# Patient Record
Sex: Male | Born: 1995 | Race: White | Hispanic: No | Marital: Single | Smoking: Current every day smoker
Health system: Southern US, Community
[De-identification: ages and names within clinical notes are randomized; demographics above are authoritative.]

## PROBLEM LIST (undated history)

## (undated) DIAGNOSIS — R197 Diarrhea, unspecified: Secondary | ICD-10-CM

## (undated) DIAGNOSIS — J45909 Unspecified asthma, uncomplicated: Secondary | ICD-10-CM

## (undated) DIAGNOSIS — R4689 Other symptoms and signs involving appearance and behavior: Secondary | ICD-10-CM

## (undated) DIAGNOSIS — A0472 Enterocolitis due to Clostridium difficile, not specified as recurrent: Secondary | ICD-10-CM

## (undated) DIAGNOSIS — B829 Intestinal parasitism, unspecified: Secondary | ICD-10-CM

## (undated) DIAGNOSIS — F1920 Other psychoactive substance dependence, uncomplicated: Secondary | ICD-10-CM

## (undated) DIAGNOSIS — R4589 Other symptoms and signs involving emotional state: Secondary | ICD-10-CM

## (undated) HISTORY — DX: Intestinal parasitism, unspecified: B82.9

## (undated) HISTORY — DX: Enterocolitis due to Clostridium difficile, not specified as recurrent: A04.72

## (undated) HISTORY — DX: Diarrhea, unspecified: R19.7

---
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
## 2017-10-27 ENCOUNTER — Emergency Department: Admission: EM | Admit: 2017-10-27 | Discharge: 2017-10-27 | Discharge status: Against Medical Advice | Payer: Self-pay

## 2017-10-27 NOTE — ED Triage Notes (Signed)
Called x 3 for triage, not in WR

## 2018-08-24 ENCOUNTER — Other Ambulatory Visit: Payer: Self-pay

## 2018-08-24 ENCOUNTER — Encounter: Payer: Self-pay | Admitting: Emergency Medicine

## 2018-08-24 ENCOUNTER — Emergency Department
Admission: EM | Admit: 2018-08-24 | Discharge: 2018-08-24 | Disposition: A | Discharge status: Home / Self Care | Payer: Self-pay | Attending: Emergency Medicine | Admitting: Emergency Medicine

## 2018-08-24 DIAGNOSIS — Z915 Personal history of self-harm: Secondary | ICD-10-CM | POA: Insufficient documentation

## 2018-08-24 DIAGNOSIS — F1123 Opioid dependence with withdrawal: Secondary | ICD-10-CM | POA: Insufficient documentation

## 2018-08-24 DIAGNOSIS — F1290 Cannabis use, unspecified, uncomplicated: Secondary | ICD-10-CM | POA: Insufficient documentation

## 2018-08-24 DIAGNOSIS — F1193 Opioid use, unspecified with withdrawal: Secondary | ICD-10-CM

## 2018-08-24 DIAGNOSIS — F17200 Nicotine dependence, unspecified, uncomplicated: Secondary | ICD-10-CM | POA: Insufficient documentation

## 2018-08-24 DIAGNOSIS — J45909 Unspecified asthma, uncomplicated: Secondary | ICD-10-CM | POA: Insufficient documentation

## 2018-08-24 LAB — URINE DRUG SCREEN, QUALITATIVE (ARMC ONLY)
Amphetamines, Ur Screen: NOT DETECTED
Barbiturates, Ur Screen: NOT DETECTED
Benzodiazepine, Ur Scrn: NOT DETECTED
Cannabinoid 50 Ng, Ur ~~LOC~~: POSITIVE — AB
Cocaine Metabolite,Ur ~~LOC~~: NOT DETECTED
MDMA (Ecstasy)Ur Screen: NOT DETECTED
Methadone Scn, Ur: NOT DETECTED
Opiate, Ur Screen: POSITIVE — AB
Phencyclidine (PCP) Ur S: NOT DETECTED
Tricyclic, Ur Screen: NOT DETECTED

## 2018-08-24 MED ORDER — ONDANSETRON 4 MG PO TBDP
4.0000 mg | ORAL_TABLET | Freq: Four times a day (QID) | ORAL | Status: DC | PRN
  Administered 2018-08-24: 4 mg via ORAL
  Filled 2018-08-24: qty 1

## 2018-08-24 MED ORDER — HYDROXYZINE HCL 25 MG PO TABS
25.0000 mg | ORAL_TABLET | Freq: Four times a day (QID) | ORAL | Status: DC | PRN
  Administered 2018-08-24: 25 mg via ORAL
  Filled 2018-08-24: qty 1

## 2018-08-24 MED ORDER — NAPROXEN 500 MG PO TABS
500.0000 mg | ORAL_TABLET | Freq: Two times a day (BID) | ORAL | Status: DC | PRN
  Administered 2018-08-24: 500 mg via ORAL
  Filled 2018-08-24: qty 1

## 2018-08-24 MED ORDER — DICYCLOMINE HCL 20 MG PO TABS
20.0000 mg | ORAL_TABLET | Freq: Four times a day (QID) | ORAL | Status: DC | PRN
  Administered 2018-08-24: 20 mg via ORAL
  Filled 2018-08-24: qty 1

## 2018-08-24 NOTE — ED Notes (Signed)
Pt seeking detox for heroin use, last used 08/23/18. Uses multiple times per day. Uses marijuana and cigarettes daily. Denies alcohol use. Denies SI or HI. Pt alert and oriented X4, active, cooperative, pt in NAD. RR even and unlabored, color WNL.

## 2018-08-24 NOTE — ED Notes (Signed)
Pt given snack. 

## 2018-08-24 NOTE — ED Provider Notes (Signed)
[facility] Emergency Department Provider Note   ____________________________________________   First MD Initiated Contact with Patient 08/24/18 1325     (approximate)  I have reviewed the triage vital signs and the nursing notes.   HISTORY  Chief Complaint detox    HPI Lance Marquez is a 23 y.o. male and therefore requested treatment for heroin withdrawals  Patient reports he has a job that is going to start soon, he is a regular heroin user and he is decided to stop.  He last used last evening.  He started to experience symptoms withdrawal including some sweats, nausea, feels a little bit itchy or crawly  Some nausea.  No chest pain or shortness of breath no fevers.  Denies any recent infections.  He is anxious, reports has had the symptoms in the past same symptoms many times when he stopped heroin  No desire to harm himself or anyone else.  Denies hallucinations.   Past Medical History:  Diagnosis Date  . Asthma   . C. difficile diarrhea   . Diarrhea   . Parasites in stool   . Suicidal behavior     Patient Active Problem List   Diagnosis Date Noted  . Suicidal behavior   . Diarrhea   . C. difficile diarrhea   . Parasites in stool     History reviewed. No pertinent surgical history.  Prior to Admission medications   Medication Sig Start Date End Date Taking? Authorizing Provider  albuterol (PROVENTIL) (2.5 MG/3ML) 0.083% nebulizer solution Inhale 2.5 mg into the lungs every 6 (six) hours as needed.   Yes [provider]    Allergies Vancomycin  No family history on file.  Social History Social History   Tobacco Use  . Smoking status: Current Every Day Smoker  . Smokeless tobacco: Never Used  Substance Use Topics  . Alcohol use: No  . Drug use: Yes    Types: IV    Comment: heroin    Review of Systems Constitutional: No fever but gets chilled feeling which she attributes to withdrawal has had before  Eyes: No visual changes. ENT: No sore throat. Cardiovascular: Denies chest pain. Respiratory: Denies shortness of breath. Gastrointestinal: No abdominal pain.  Little bit of nausea. Genitourinary: Negative for dysuria. Musculoskeletal: Negative for back pain.  Does feel little achy Skin: Negative for rash. Neurological: Negative for headaches, areas of focal weakness or numbness.    ____________________________________________   PHYSICAL EXAM:  VITAL SIGNS: ED Triage Vitals  Enc Vitals Group     BP 08/24/18 1029 115/69     Pulse Rate 08/24/18 1029 (!) 121     Resp 08/24/18 1029 17     Temp 08/24/18 1029 99 F (37.2 C)     Temp Source 08/24/18 1029 Oral     SpO2 08/24/18 1029 100 %     Weight 08/24/18 1030 120 lb (54.4 kg)     Height 08/24/18 1030 5\' 9"  (1.753 m)     Head Circumference --      Peak Flow --      Pain Score 08/24/18 1042 0     Pain Loc --      Pain Edu? --      Excl. in [place]? --     Constitutional: Alert and oriented.  Slightly anxious but in no distress Eyes: Conjunctivae are normal. Head: Atraumatic. Nose: No congestion/rhinnorhea. Mouth/Throat: Mucous membranes are moist. Neck: No stridor.  Cardiovascular: Mildly tachycardic rate, regular rhythm. Grossly normal  heart sounds.  Good peripheral circulation. Respiratory: Normal respiratory effort.  No retractions. Lungs CTAB. Gastrointestinal: Soft and nontender. No distention. Musculoskeletal: No lower extremity tenderness nor edema. Neurologic:  Normal speech and language. No gross focal neurologic deficits are appreciated.  Skin:  Skin is warm, dry and intact. No rash noted. Psychiatric: Mood and affect are calm and pleasant.  Normal thought patterns.  Denies hallucinations, desire to harm himself or anyone else.  ____________________________________________   LABS (all labs ordered are listed, but only abnormal results are displayed)  Labs Reviewed  URINE DRUG SCREEN, QUALITATIVE ([facility] ONLY)    ____________________________________________  EKG   ____________________________________________  RADIOLOGY   ____________________________________________   PROCEDURES  Procedure(s) performed: None  Procedures  Critical Care performed: No  ____________________________________________   INITIAL IMPRESSION / ASSESSMENT AND PLAN / ED COURSE  Pertinent labs & imaging results that were available during my care of the patient were reviewed by me and considered in my medical decision making (see chart for details).   Patient presents for heroin withdrawal.  His clinical history and examination appear consistent with this.  He reports same symptoms in the past multiple times.  Denies fever.  Afebrile here.  Slightly tachycardic does report he is a little nervous also going through withdrawals, and I think is mild to moderate tachycardia is associated with his withdrawals.  Will treat with withdrawal relieving medications, have consulted TTS.  Lance Marquez was evaluated in Emergency Department on 08/24/2018 for the symptoms described in the history of present illness. He was evaluated in the context of the global COVID-19 pandemic, which necessitated consideration that the patient might be at risk for infection with the SARS-CoV-2 virus that causes COVID-19. Institutional protocols and algorithms that pertain to the evaluation of patients at risk for COVID-19 are in a state of rapid change based on information released by regulatory bodies including the CDC and federal and state organizations. These policies and algorithms were followed during the patient's care in the ED.  He does not have symptoms consistent with COVID, rather his symptomatology appears consistent with withdrawal.  Denies exposure to anyone known to have COVID     ----------------------------------------- 2:45 PM on 08/24/2018 -----------------------------------------  Patient has a bed available at  residential treatment services.  He is improving with treatment here.  Patient agreeable, will plan to discharge once residential treatment services comes to pick him up for detox treatment.  Patient agreement with plan.  Ongoing care assigned to Dr. Lenard LancePaduchowski, await treatment at residential treatment services.  Patient remains well anticipate discharge once residential treatment services arrived to take patient to detox. ____________________________________________   FINAL CLINICAL IMPRESSION(S) / ED DIAGNOSES  Final diagnoses:  Opioid withdrawal (HCC)        Note:  This document was prepared using Dragon voice recognition software and may include unintentional dictation errors       Sharyn CreamerQuale, Quanetta Truss, MD 08/24/18 2120

## 2018-08-24 NOTE — BH Assessment (Signed)
Pt needs labs completed in order to be considered for detox treatment per [facility].

## 2018-08-24 NOTE — ED Triage Notes (Signed)
[facility] wishes to detox from Heroin.  Last used last night at around 2330.  Denies si/hi

## 2018-08-24 NOTE — ED Notes (Signed)
Pt discharged from ER to [facility]. Left with all of belongings. Pt alert and oriented X4, active, cooperative, pt in NAD. RR even and unlabored, color WNL.

## 2018-08-24 NOTE — BH Assessment (Signed)
Pt information faxed to [facility] for detox treatment ([phone]).

## 2018-08-24 NOTE — ED Notes (Signed)
Pt waiting for TTS 

## 2019-02-28 ENCOUNTER — Encounter: Payer: Self-pay | Admitting: Intensive Care

## 2019-02-28 ENCOUNTER — Emergency Department
Admission: EM | Admit: 2019-02-28 | Discharge: 2019-02-28 | Disposition: A | Discharge status: Home / Self Care | Payer: Self-pay | Attending: Emergency Medicine | Admitting: Emergency Medicine

## 2019-02-28 ENCOUNTER — Other Ambulatory Visit: Payer: Self-pay

## 2019-02-28 DIAGNOSIS — F1110 Opioid abuse, uncomplicated: Secondary | ICD-10-CM | POA: Insufficient documentation

## 2019-02-28 DIAGNOSIS — J45909 Unspecified asthma, uncomplicated: Secondary | ICD-10-CM | POA: Insufficient documentation

## 2019-02-28 DIAGNOSIS — Z79899 Other long term (current) drug therapy: Secondary | ICD-10-CM | POA: Insufficient documentation

## 2019-02-28 DIAGNOSIS — F1510 Other stimulant abuse, uncomplicated: Secondary | ICD-10-CM | POA: Insufficient documentation

## 2019-02-28 DIAGNOSIS — F1910 Other psychoactive substance abuse, uncomplicated: Secondary | ICD-10-CM

## 2019-02-28 DIAGNOSIS — F17210 Nicotine dependence, cigarettes, uncomplicated: Secondary | ICD-10-CM | POA: Insufficient documentation

## 2019-02-28 HISTORY — DX: Other psychoactive substance dependence, uncomplicated: F19.20

## 2019-02-28 LAB — CBC
HCT: 48.9 % (ref 39.0–52.0)
Hemoglobin: 16.5 g/dL (ref 13.0–17.0)
MCH: 32.2 pg (ref 26.0–34.0)
MCHC: 33.7 g/dL (ref 30.0–36.0)
MCV: 95.5 fL (ref 80.0–100.0)
Platelets: 321 10*3/uL (ref 150–400)
RBC: 5.12 MIL/uL (ref 4.22–5.81)
RDW: 12.8 % (ref 11.5–15.5)
WBC: 28.5 10*3/uL — ABNORMAL HIGH (ref 4.0–10.5)
nRBC: 0 % (ref 0.0–0.2)

## 2019-02-28 LAB — COMPREHENSIVE METABOLIC PANEL
ALT: 25 U/L (ref 0–44)
AST: 40 U/L (ref 15–41)
Albumin: 4.3 g/dL (ref 3.5–5.0)
Alkaline Phosphatase: 114 U/L (ref 38–126)
Anion gap: 10 (ref 5–15)
BUN: 15 mg/dL (ref 6–20)
CO2: 23 mmol/L (ref 22–32)
Calcium: 9 mg/dL (ref 8.9–10.3)
Chloride: 105 mmol/L (ref 98–111)
Creatinine, Ser: 1.17 mg/dL (ref 0.61–1.24)
GFR calc Af Amer: >60 mL/min (ref >60)
GFR calc non Af Amer: >60 mL/min (ref >60)
Glucose, Bld: 106 mg/dL — ABNORMAL HIGH (ref 70–99)
Potassium: 5 mmol/L (ref 3.5–5.1)
Sodium: 138 mmol/L (ref 135–145)
Total Bilirubin: 0.7 mg/dL (ref 0.3–1.2)
Total Protein: 8.3 g/dL — ABNORMAL HIGH (ref 6.5–8.1)

## 2019-02-28 LAB — ETHANOL: Alcohol, Ethyl (B): <10 mg/dL (ref <10)

## 2019-02-28 NOTE — ED Notes (Signed)
Pt alert and oriented, nad noted.

## 2019-02-28 NOTE — ED Provider Notes (Signed)
[facility] Emergency Department Provider Note ____________________________________________   First MD Initiated Contact with Patient 02/28/19 1721     (approximate)  I have reviewed the triage vital signs and the nursing notes.   HISTORY  Chief Complaint Addiction Problem and Ear Pain  Level 5 caveat: History of present illness limited due to drug intoxication  HPI Lance Marquez is a 24 y.o. male with PMH as noted below as well as a history of polysubstance abuse (heroin, methamphetamine) presents after he used heroin earlier today and then awoke feeling very weak, confused, and with anxiety and ringing in his ears.  The patient states he felt like he was stumbling around and could not think clearly.  He states that he had not used heroin for about 6 months before today, although he did not take the large amount.  He states he last used methamphetamine about 3 days ago.  He states he is still feeling a bit tired and confused but better than he did earlier.   Past Medical History:  Diagnosis Date  . Asthma   . C. difficile diarrhea   . Diarrhea   . Drug abuse and dependence ([place])   . Parasites in stool   . Suicidal behavior     Patient Active Problem List   Diagnosis Date Noted  . Suicidal behavior   . Diarrhea   . C. difficile diarrhea   . Parasites in stool     History reviewed. No pertinent surgical history.  Prior to Admission medications   Medication Sig Start Date End Date Taking? Authorizing Provider  albuterol (PROVENTIL) (2.5 MG/3ML) 0.083% nebulizer solution Inhale 2.5 mg into the lungs every 6 (six) hours as needed.    [provider]    Allergies Vancomycin  History reviewed. No pertinent family history.  Social History Social History   Tobacco Use  . Smoking status: Current Every Day Smoker    Types: Cigarettes  . Smokeless tobacco: Never Used  Substance Use Topics  . Alcohol use: Yes    Comment: occ  .  Drug use: Yes    Types: IV    Comment: heroin, meth    Review of Systems Level 5 caveat: Unable to obtain review of systems due to drug intoxication    ____________________________________________   PHYSICAL EXAM:  VITAL SIGNS: ED Triage Vitals  Enc Vitals Group     BP 02/28/19 1644 107/78     Pulse Rate 02/28/19 1644 99     Resp 02/28/19 1644 16     Temp 02/28/19 1758 98.3 F (36.8 C)     Temp Source 02/28/19 1758 Oral     SpO2 02/28/19 1644 100 %     Weight 02/28/19 1645 130 lb (59 kg)     Height 02/28/19 1645 5\' 9"  (1.753 m)     Head Circumference --      Peak Flow --      Pain Score 02/28/19 1645 0     Pain Loc --      Pain Edu? --      Excl. in [place]? --     Constitutional: Somnolent but arousable.  Oriented x4. Eyes: Conjunctivae are normal.  EOMI. Head: Atraumatic. Nose: No congestion/rhinnorhea. Mouth/Throat: Mucous membranes are moist.   Neck: Normal range of motion.  Cardiovascular: Normal rate, regular rhythm.  Good peripheral circulation. Respiratory: Normal respiratory effort.  No retractions.  Gastrointestinal:  No distention.  Musculoskeletal:   Extremities warm and well perfused.  Neurologic: Motor intact in all extremities. Skin:  Skin is warm and dry. No rash noted. Psychiatric: Calm and cooperative.  ____________________________________________   LABS (all labs ordered are listed, but only abnormal results are displayed)  Labs Reviewed  COMPREHENSIVE METABOLIC PANEL - Abnormal; Notable for the following components:      Result Value   Glucose, Bld 106 (*)    Total Protein 8.3 (*)    All other components within normal limits  CBC - Abnormal; Notable for the following components:   WBC 28.5 (*)    All other components within normal limits  ETHANOL  URINE DRUG SCREEN, QUALITATIVE ([facility] ONLY)   ____________________________________________  EKG   ____________________________________________  RADIOLOGY     ____________________________________________   PROCEDURES  Procedure(s) performed: No  Procedures  Critical Care performed: No ____________________________________________   INITIAL IMPRESSION / ASSESSMENT AND PLAN / ED COURSE  Pertinent labs & imaging results that were available during my care of the patient were reviewed by me and considered in my medical decision making (see chart for details).  24 year old male with a history of polysubstance abuse presents with symptoms include anxiety, ringing in his ears, and feeling confused and weak when he awoke after using heroin earlier today.  The patient reports that this was his first time using heroin in several months.  He last used methamphetamine 2 or 3 days ago.  He states he does not drink.  Currently on exam, the patient is a bit somnolent but arousable and able to answer questions.  Neurologic exam is nonfocal.  There is no evidence of trauma.  I suspect that the patient's symptoms are related to polysubstance abuse.  He has no evidence of heroin or methamphetamine withdrawal at this time.  I suspect that the patient had more severe side effects than normal given he had not used heroin for about 6 months.  We will obtain lab work-up and observe the patient for several hours.  ----------------------------------------- 7:57 PM on 02/28/2019 -----------------------------------------  The patient now is fully awake and alert, oriented x4, and ambulating with steady gait.  He states he is feeling much better and no longer feels confused.  He no longer has any ringing in his ears.  He feels well to go home.  He has no psychiatric symptoms and does not require psychiatric evaluation.  At this time, he is stable for discharge.  I offered the patient substance abuse counseling resources.  Return precautions provided and he expressed understanding.  ____________________________________________   FINAL CLINICAL IMPRESSION(S) / ED  DIAGNOSES  Final diagnoses:  Substance abuse (HCC)      NEW MEDICATIONS STARTED DURING THIS VISIT:  New Prescriptions   No medications on file     Note:  This document was prepared using Dragon voice recognition software and may include unintentional dictation errors.    Dionne Bucy, MD 02/28/19 1958

## 2019-02-28 NOTE — ED Notes (Signed)
Discharge signature in paper chart- pt was in hallway bed.

## 2019-02-28 NOTE — ED Notes (Signed)
Pt appears very sleepy, states that he used heroin today and then went to sleep, pt states that when he woke up he felt really strange states that he has never felt that way before, but states that he hadn't used heroin in awhile that he had quit it and has only been using meth, pt states that he hasn't used meth in 2-3 days. Ems reported that the patient was c/o ringing in his ears. Pt is placed in a recliner in the hall for easier visualization, pt is falling asleep easily and drooping his ice water. No distress noted at this time
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
## 2019-02-28 NOTE — ED Notes (Signed)
Spoke w/ MD Siadecki about plan of care. Will ambulate pt when he wakes up and assess condition for d/c per md request.

## 2019-02-28 NOTE — ED Notes (Signed)
Pt ambulates without difficulty, denies any dizziness. Pt provided urinal and assisted to bathroom. MD notified.
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
## 2019-02-28 NOTE — Discharge Instructions (Addendum)
Return to the ER for any new, worsening, or persistent symptoms that concern you.

## 2019-02-28 NOTE — ED Notes (Signed)
UA not collected- pt states he cannot pee.

## 2019-02-28 NOTE — ED Notes (Signed)
Pt offered dinner; declined 

## 2019-02-28 NOTE — ED Notes (Signed)
Pt states he doesn't feel as dizzy as before and he is just tired and wants to go home and sleep. Explained to pt change of shift and that new MD will be assigned and discharge or observation can be discussed at that time. Pt verbalizes understanding.

## 2019-08-31 ENCOUNTER — Emergency Department
Admission: EM | Admit: 2019-08-31 | Discharge: 2019-08-31 | Disposition: A | Discharge status: Home / Self Care | Payer: Self-pay | Attending: Emergency Medicine | Admitting: Emergency Medicine

## 2019-08-31 ENCOUNTER — Other Ambulatory Visit: Payer: Self-pay

## 2019-08-31 DIAGNOSIS — Z5321 Procedure and treatment not carried out due to patient leaving prior to being seen by health care provider: Secondary | ICD-10-CM | POA: Insufficient documentation

## 2019-08-31 NOTE — ED Triage Notes (Signed)
Pt comes via POV from home with c/o right foot laceration. Pt states he kicked a tool box that had a metal piece sticking out of it.  Pt has bandage in place and bleeding controlled at this time

## 2019-08-31 NOTE — ED Notes (Signed)
Called for the pt in the WR with no response

## 2020-04-17 ENCOUNTER — Encounter: Payer: Self-pay | Admitting: Emergency Medicine

## 2020-04-17 ENCOUNTER — Other Ambulatory Visit: Payer: Self-pay

## 2020-04-17 ENCOUNTER — Emergency Department
Admission: EM | Admit: 2020-04-17 | Discharge: 2020-04-17 | Disposition: A | Discharge status: Home / Self Care | Payer: Self-pay | Attending: Emergency Medicine | Admitting: Emergency Medicine

## 2020-04-17 DIAGNOSIS — R300 Dysuria: Secondary | ICD-10-CM | POA: Insufficient documentation

## 2020-04-17 DIAGNOSIS — R112 Nausea with vomiting, unspecified: Secondary | ICD-10-CM | POA: Insufficient documentation

## 2020-04-17 DIAGNOSIS — F17210 Nicotine dependence, cigarettes, uncomplicated: Secondary | ICD-10-CM | POA: Insufficient documentation

## 2020-04-17 DIAGNOSIS — J45909 Unspecified asthma, uncomplicated: Secondary | ICD-10-CM | POA: Insufficient documentation

## 2020-04-17 DIAGNOSIS — R109 Unspecified abdominal pain: Secondary | ICD-10-CM | POA: Insufficient documentation

## 2020-04-17 LAB — COMPREHENSIVE METABOLIC PANEL
ALT: 17 U/L (ref 0–44)
AST: 22 U/L (ref 15–41)
Albumin: 4 g/dL (ref 3.5–5.0)
Alkaline Phosphatase: 79 U/L (ref 38–126)
Anion gap: 8 (ref 5–15)
BUN: 24 mg/dL — ABNORMAL HIGH (ref 6–20)
CO2: 23 mmol/L (ref 22–32)
Calcium: 9 mg/dL (ref 8.9–10.3)
Chloride: 107 mmol/L (ref 98–111)
Creatinine, Ser: 0.95 mg/dL (ref 0.61–1.24)
GFR, Estimated: >60 mL/min (ref >60)
Glucose, Bld: 96 mg/dL (ref 70–99)
Potassium: 4 mmol/L (ref 3.5–5.1)
Sodium: 138 mmol/L (ref 135–145)
Total Bilirubin: 0.9 mg/dL (ref 0.3–1.2)
Total Protein: 7.6 g/dL (ref 6.5–8.1)

## 2020-04-17 LAB — CBC
HCT: 36.6 % — ABNORMAL LOW (ref 39.0–52.0)
Hemoglobin: 12.7 g/dL — ABNORMAL LOW (ref 13.0–17.0)
MCH: 31.1 pg (ref 26.0–34.0)
MCHC: 34.7 g/dL (ref 30.0–36.0)
MCV: 89.7 fL (ref 80.0–100.0)
Platelets: 351 10*3/uL (ref 150–400)
RBC: 4.08 MIL/uL — ABNORMAL LOW (ref 4.22–5.81)
RDW: 13 % (ref 11.5–15.5)
WBC: 8.8 10*3/uL (ref 4.0–10.5)
nRBC: 0 % (ref 0.0–0.2)

## 2020-04-17 LAB — URINALYSIS, COMPLETE (UACMP) WITH MICROSCOPIC
Bacteria, UA: NONE SEEN
Bilirubin Urine: NEGATIVE
Glucose, UA: NEGATIVE mg/dL
Hgb urine dipstick: NEGATIVE
Ketones, ur: NEGATIVE mg/dL
Leukocytes,Ua: NEGATIVE
Nitrite: NEGATIVE
Protein, ur: NEGATIVE mg/dL
Specific Gravity, Urine: 1.026 (ref 1.005–1.030)
Squamous Epithelial / HPF: NONE SEEN (ref 0–5)
pH: 7 (ref 5.0–8.0)

## 2020-04-17 MED ORDER — HYDROMORPHONE HCL 1 MG/ML IJ SOLN
1.0000 mg | Freq: Once | INTRAMUSCULAR | Status: AC
Start: 2020-04-17 — End: 2020-04-17
  Administered 2020-04-17: 1 mg via INTRAVENOUS
  Filled 2020-04-17: qty 1

## 2020-04-17 MED ORDER — ONDANSETRON HCL 4 MG/2ML IJ SOLN
4.0000 mg | Freq: Once | INTRAMUSCULAR | Status: AC
  Administered 2020-04-17: 4 mg via INTRAVENOUS
  Filled 2020-04-17: qty 2

## 2020-04-17 MED ORDER — NAPROXEN 500 MG PO TABS: 500.0000 mg | ORAL_TABLET | Freq: Two times a day (BID) | ORAL | Status: DC

## 2020-04-17 MED ORDER — KETOROLAC TROMETHAMINE 30 MG/ML IJ SOLN
30.0000 mg | Freq: Once | INTRAMUSCULAR | Status: AC
  Administered 2020-04-17: 30 mg via INTRAVENOUS
  Filled 2020-04-17: qty 1

## 2020-04-17 MED ORDER — ONDANSETRON HCL 8 MG PO TABS: 8.0000 mg | ORAL_TABLET | Freq: Three times a day (TID) | ORAL | 0 refills | Status: DC | PRN

## 2020-04-17 MED ORDER — SODIUM CHLORIDE 0.9 % IV BOLUS
1000.0000 mL | Freq: Once | INTRAVENOUS | Status: AC
  Administered 2020-04-17: 1000 mL via INTRAVENOUS

## 2020-04-17 MED ORDER — ORPHENADRINE CITRATE ER 100 MG PO TB12: 100.0000 mg | ORAL_TABLET | Freq: Two times a day (BID) | ORAL | 0 refills | Status: DC

## 2020-04-17 NOTE — ED Triage Notes (Signed)
C/O right flank pain and painful urination x 2 days. States had similar symptoms 1.5 years ago with kidney stones.  AAOx3. Skin warm and dry. NAD

## 2020-04-17 NOTE — ED Notes (Signed)
See triage note  Presents with right flank pain  States pain started couple of days ago  Pain is non radiating   Positive n/v    Also having some problems with urination

## 2020-04-17 NOTE — ED Provider Notes (Signed)
[facility] Emergency Department Provider Note   ____________________________________________   Event Date/Time   First MD Initiated Contact with Patient 04/17/20 1104     (approximate)  I have reviewed the triage vital signs and the nursing notes.   HISTORY  Chief Complaint Flank Pain    HPI Lance Marquez is a 25 y.o. male patient presents with right flank pain for a few days.  Patient also has nausea and vomiting.  States dysuria.  Denies urethral discharge.  Denies high risk sexual contact.  Patient  concerned secondary to history of kidney stones.  Last episode was approximate 1-1/2 years ago.  Rates his pain as a 5/10.  Described pain as "achy".  No palliative measure for complaint.         Past Medical History:  Diagnosis Date  . Asthma   . C. difficile diarrhea   . Diarrhea   . Drug abuse and dependence (HCC)   . Parasites in stool   . Suicidal behavior     Patient Active Problem List   Diagnosis Date Noted  . Suicidal behavior   . Diarrhea   . C. difficile diarrhea   . Parasites in stool     History reviewed. No pertinent surgical history.  Prior to Admission medications   Medication Sig Start Date End Date Taking? Authorizing Provider  naproxen (NAPROSYN) 500 MG tablet Take 1 tablet (500 mg total) by mouth 2 (two) times daily with a meal. 04/17/20  Yes Joni Reining, PA-C  ondansetron (ZOFRAN) 8 MG tablet Take 1 tablet (8 mg total) by mouth every 8 (eight) hours as needed for nausea or vomiting. 04/17/20  Yes Joni Reining, PA-C  orphenadrine (NORFLEX) 100 MG tablet Take 1 tablet (100 mg total) by mouth 2 (two) times daily. 04/17/20  Yes Joni Reining, PA-C  albuterol (PROVENTIL) (2.5 MG/3ML) 0.083% nebulizer solution Inhale 2.5 mg into the lungs every 6 (six) hours as needed.    [provider]    Allergies Vancomycin  No family history on file.  Social History Social History   Tobacco Use  . Smoking  status: Current Every Day Smoker    Types: Cigarettes  . Smokeless tobacco: Never Used  Substance Use Topics  . Alcohol use: Yes    Comment: occ  . Drug use: Yes    Types: IV    Comment: heroin, meth    Review of Systems Constitutional: No fever/chills Eyes: No visual changes. ENT: No sore throat. Cardiovascular: Denies chest pain. Respiratory: Denies shortness of breath. Gastrointestinal: No abdominal pain.  Nausea and vomiting.  No diarrhea.  No constipation. Genitourinary: Positive for dysuria. Musculoskeletal: Negative for back pain. Skin: Negative for rash. Neurological: Negative for headaches, focal weakness or numbness. Allergic/Immunilogical: Vancomycin ____________________________________________   PHYSICAL EXAM:  VITAL SIGNS: ED Triage Vitals  Enc Vitals Group     BP 04/17/20 0944 121/72     Pulse Rate 04/17/20 0944 98     Resp 04/17/20 0944 16     Temp 04/17/20 0944 98.2 F (36.8 C)     Temp Source 04/17/20 0944 Oral     SpO2 04/17/20 0948 100 %     Weight 04/17/20 0946 130 lb (59 kg)     Height 04/17/20 0946 5\' 10"  (1.778 m)     Head Circumference --      Peak Flow --      Pain Score 04/17/20 0945 5     Pain Loc --  Pain Edu? --      Excl. in GC? --    Constitutional: Alert and oriented. Well appearing and in no acute distress. Hematological/Lymphatic/Immunilogical: No cervical lymphadenopathy. Cardiovascular: Normal rate, regular rhythm. Grossly normal heart sounds.  Good peripheral circulation. Respiratory: Normal respiratory effort.  No retractions. Lungs CTAB. Gastrointestinal: Soft and nontender. No distention. No abdominal bruits.  CVA tenderness. Genitourinary: Deferred Musculoskeletal: No lower extremity tenderness nor edema.  No joint effusions. Neurologic:  Normal speech and language. No gross focal neurologic deficits are appreciated. No gait instability. Skin:  Skin is warm, dry and intact. No rash noted. Psychiatric: Mood and affect  are normal. Speech and behavior are normal.  ____________________________________________   LABS (all labs ordered are listed, but only abnormal results are displayed)  Labs Reviewed  COMPREHENSIVE METABOLIC PANEL - Abnormal; Notable for the following components:      Result Value   BUN 24 (*)    All other components within normal limits  CBC - Abnormal; Notable for the following components:   RBC 4.08 (*)    Hemoglobin 12.7 (*)    HCT 36.6 (*)    All other components within normal limits  URINALYSIS, COMPLETE (UACMP) WITH MICROSCOPIC - Abnormal; Notable for the following components:   Color, Urine YELLOW (*)    APPearance CLEAR (*)    All other components within normal limits   ____________________________________________  EKG   ____________________________________________  RADIOLOGY I, Joni Reining, personally viewed and evaluated these images (plain radiographs) as part of my medical decision making, as well as reviewing the written report by the radiologist.  ED MD interpretation:    Official radiology report(s): No results found.  ____________________________________________   PROCEDURES  Procedure(s) performed (including Critical Care):  Procedures   ____________________________________________   INITIAL IMPRESSION / ASSESSMENT AND PLAN / ED COURSE  As part of my medical decision making, I reviewed the following data within the electronic MEDICAL RECORD NUMBER         Patient presents with right flank pain for few days.  Patient also complained of nausea and vomiting.  Patient physical exam is unremarkable for complaint.  Discussed no acute findings on urinalysis.  Discussed lab results with patient.  Patient states he cannot stay for further evaluation since he has pick up  child from school.      ____________________________________________   FINAL CLINICAL IMPRESSION(S) / ED DIAGNOSES  Final diagnoses:  Right flank pain     ED Discharge  Orders         Ordered    naproxen (NAPROSYN) 500 MG tablet  2 times daily with meals        04/17/20 1256    orphenadrine (NORFLEX) 100 MG tablet  2 times daily        04/17/20 1256    ondansetron (ZOFRAN) 8 MG tablet  Every 8 hours PRN        04/17/20 1300          *Please note:  Lance Marquez was evaluated in Emergency Department on 04/17/2020 for the symptoms described in the history of present illness. He was evaluated in the context of the global COVID-19 pandemic, which necessitated consideration that the patient might be at risk for infection with the SARS-CoV-2 virus that causes COVID-19. Institutional protocols and algorithms that pertain to the evaluation of patients at risk for COVID-19 are in a state of rapid change based on information released by regulatory bodies including the CDC and federal and state  organizations. These policies and algorithms were followed during the patient's care in the ED.  Some ED evaluations and interventions may be delayed as a result of limited staffing during and the pandemic.*   Note:  This document was prepared using Dragon voice recognition software and may include unintentional dictation errors.    Joni Reining, PA-C 04/17/20 1303    Sharyn Creamer, MD 04/17/20 2154

## 2020-04-17 NOTE — Discharge Instructions (Addendum)
No acute finding on urine sample.  Follow discharge care instruction take medication as directed.  Return to ED if condition worsens.

## 2020-07-24 ENCOUNTER — Encounter: Payer: Self-pay | Admitting: Emergency Medicine

## 2020-07-24 ENCOUNTER — Other Ambulatory Visit: Payer: Self-pay

## 2020-07-24 ENCOUNTER — Emergency Department
Admission: EM | Admit: 2020-07-24 | Discharge: 2020-07-24 | Disposition: A | Discharge status: Home / Self Care | Payer: Self-pay | Attending: Emergency Medicine | Admitting: Emergency Medicine

## 2020-07-24 DIAGNOSIS — R5383 Other fatigue: Secondary | ICD-10-CM | POA: Insufficient documentation

## 2020-07-24 DIAGNOSIS — Z28310 Unvaccinated for covid-19: Secondary | ICD-10-CM | POA: Insufficient documentation

## 2020-07-24 DIAGNOSIS — J45909 Unspecified asthma, uncomplicated: Secondary | ICD-10-CM | POA: Insufficient documentation

## 2020-07-24 DIAGNOSIS — Z20822 Contact with and (suspected) exposure to covid-19: Secondary | ICD-10-CM | POA: Insufficient documentation

## 2020-07-24 DIAGNOSIS — R0981 Nasal congestion: Secondary | ICD-10-CM | POA: Insufficient documentation

## 2020-07-24 DIAGNOSIS — K625 Hemorrhage of anus and rectum: Secondary | ICD-10-CM | POA: Insufficient documentation

## 2020-07-24 DIAGNOSIS — R519 Headache, unspecified: Secondary | ICD-10-CM | POA: Insufficient documentation

## 2020-07-24 DIAGNOSIS — R197 Diarrhea, unspecified: Secondary | ICD-10-CM | POA: Insufficient documentation

## 2020-07-24 DIAGNOSIS — F17210 Nicotine dependence, cigarettes, uncomplicated: Secondary | ICD-10-CM | POA: Insufficient documentation

## 2020-07-24 DIAGNOSIS — M791 Myalgia, unspecified site: Secondary | ICD-10-CM | POA: Insufficient documentation

## 2020-07-24 LAB — SARS CORONAVIRUS 2 (TAT 6-24 HRS): SARS Coronavirus 2: NEGATIVE

## 2020-07-24 MED ORDER — NIRMATRELVIR/RITONAVIR (PAXLOVID)TABLET: 3.0000 | ORAL_TABLET | Freq: Two times a day (BID) | ORAL | 0 refills | Status: AC

## 2020-07-24 NOTE — Discharge Instructions (Addendum)
Please fill prescription if your covid test comes back positive!

## 2020-07-24 NOTE — ED Triage Notes (Signed)
C/O sinus congestion x 3 days and rectal bleeding this morning.  AAOX3.  Skin warm and dry. NAD

## 2020-07-24 NOTE — ED Provider Notes (Signed)
[facility] Emergency Department Provider Note   ____________________________________________    I have reviewed the triage vital signs and the nursing notes.   HISTORY  Chief Complaint sinus congestion and Rectal Bleeding      HPI Lance Marquez is a 25 y.o. male with history as noted below who presents with complaints of headache, sinus congestion, body aches, fatigue as well as some diarrhea.  He reports he noticed a small amount of bright red blood in his diarrhea today as well.  Did have a fever last night, took Tylenol.  Diarrhea only started yesterday.  Headache started 3 days ago.  Has not been vaccinated against COVID-19  Past Medical History:  Diagnosis Date   Asthma    C. difficile diarrhea    Diarrhea    Drug abuse and dependence (HCC)    Parasites in stool    Suicidal behavior     Patient Active Problem List   Diagnosis Date Noted   Suicidal behavior    Diarrhea    C. difficile diarrhea    Parasites in stool     History reviewed. No pertinent surgical history.  Prior to Admission medications   Medication Sig Start Date End Date Taking? Authorizing Provider  nirmatrelvir/ritonavir EUA (PAXLOVID) TABS Take 3 tablets by mouth 2 (two) times daily for 5 days. Patient GFR is normal. Take nirmatrelvir (150 mg) two tablets twice daily for 5 days and ritonavir (100 mg) one tablet twice daily for 5 days. 07/24/20 07/29/20 Yes Jene Every, MD  albuterol (PROVENTIL) (2.5 MG/3ML) 0.083% nebulizer solution Inhale 2.5 mg into the lungs every 6 (six) hours as needed.    [provider]  naproxen (NAPROSYN) 500 MG tablet Take 1 tablet (500 mg total) by mouth 2 (two) times daily with a meal. 04/17/20   Joni Reining, PA-C  ondansetron (ZOFRAN) 8 MG tablet Take 1 tablet (8 mg total) by mouth every 8 (eight) hours as needed for nausea or vomiting. 04/17/20   Joni Reining, PA-C  orphenadrine (NORFLEX) 100 MG tablet Take 1 tablet  (100 mg total) by mouth 2 (two) times daily. 04/17/20   Joni Reining, PA-C     Allergies Vancomycin  No family history on file.  Social History Social History   Tobacco Use   Smoking status: Every Day    Pack years: 0.00    Types: Cigarettes   Smokeless tobacco: Never  Substance Use Topics   Alcohol use: Yes    Comment: occ   Drug use: Yes    Types: IV    Comment: heroin, meth    Review of Systems  Constitutional: As above Eyes: No visual changes.  ENT: As above, mild sore throat Cardiovascular: Denies chest pain. Respiratory: Denies shortness of breath. Gastrointestinal: No abdominal pain.  No nausea, no vomiting.  Diarrhea as above Genitourinary: Negative for dysuria. Musculoskeletal: Myalgias Skin: Negative for rash. Neurological: Negative for weakness, positive headache   ____________________________________________   PHYSICAL EXAM:  VITAL SIGNS: ED Triage Vitals  Enc Vitals Group     BP 07/24/20 0957 133/85     Pulse Rate 07/24/20 0957 (!) 111     Resp 07/24/20 0957 17     Temp 07/24/20 0957 98.2 F (36.8 C)     Temp Source 07/24/20 0957 Oral     SpO2 07/24/20 0957 99 %     Weight 07/24/20 0951 59 kg (130 lb 1.1 oz)     Height 07/24/20 0951  1.778 m (5\' 10" )     Head Circumference --      Peak Flow --      Pain Score 07/24/20 0951 5     Pain Loc --      Pain Edu? --      Excl. in GC? --     Constitutional: Alert and oriented. No acute distress. Pleasant and interactive  Nose: No congestion/rhinnorhea. Mouth/Throat: Mucous membranes are moist.  Pharynx normal Neck:  Painless ROM Cardiovascular: Normal rate, regular rhythm. Good peripheral circulation. Respiratory: Normal respiratory effort.  No retractions.  Gastrointestinal: Soft and nontender. No distention.  No CVA tenderness.  Musculoskeletal: No lower extremity tenderness nor edema.  Warm and well perfused Neurologic:  Normal speech and language. No gross focal neurologic deficits are  appreciated.  Skin:  Skin is warm, dry and intact. No rash noted. Psychiatric: Mood and affect are normal. Speech and behavior are normal.  ____________________________________________   LABS (all labs ordered are listed, but only abnormal results are displayed)  Labs Reviewed  SARS CORONAVIRUS 2 (TAT 6-24 HRS)   ____________________________________________  EKG   ____________________________________________  RADIOLOGY   ____________________________________________   PROCEDURES  Procedure(s) performed: No  Procedures   Critical Care performed: No ____________________________________________   INITIAL IMPRESSION / ASSESSMENT AND PLAN / ED COURSE  Pertinent labs & imaging results that were available during my care of the patient were reviewed by me and considered in my medical decision making (see chart for details).   Patient overall well-appearing and in no acute distress mild tachycardia likely related to viral illness given his symptoms.  We will send COVID swab, will write prescription for back slightly to fill if COVID-positive, supportive care return precautions discussed    ____________________________________________   FINAL CLINICAL IMPRESSION(S) / ED DIAGNOSES  Final diagnoses:  Suspected COVID-19 virus infection        Note:  This document was prepared using Dragon voice recognition software and may include unintentional dictation errors.    07/26/20, MD 07/24/20 1056

## 2021-03-01 ENCOUNTER — Other Ambulatory Visit: Payer: Self-pay

## 2021-03-01 ENCOUNTER — Emergency Department: Payer: Self-pay

## 2021-03-01 ENCOUNTER — Emergency Department
Admission: EM | Admit: 2021-03-01 | Discharge: 2021-03-02 | Disposition: A | Discharge status: Home / Self Care | Payer: Self-pay | Attending: Emergency Medicine | Admitting: Emergency Medicine

## 2021-03-01 DIAGNOSIS — L03116 Cellulitis of left lower limb: Secondary | ICD-10-CM | POA: Insufficient documentation

## 2021-03-01 DIAGNOSIS — R234 Changes in skin texture: Secondary | ICD-10-CM | POA: Insufficient documentation

## 2021-03-01 LAB — COMPREHENSIVE METABOLIC PANEL
ALT: 50 U/L — ABNORMAL HIGH (ref 0–44)
AST: 89 U/L — ABNORMAL HIGH (ref 15–41)
Albumin: 4.3 g/dL (ref 3.5–5.0)
Alkaline Phosphatase: 72 U/L (ref 38–126)
Anion gap: 8 (ref 5–15)
BUN: 23 mg/dL — ABNORMAL HIGH (ref 6–20)
CO2: 25 mmol/L (ref 22–32)
Calcium: 9.3 mg/dL (ref 8.9–10.3)
Chloride: 103 mmol/L (ref 98–111)
Creatinine, Ser: 0.88 mg/dL (ref 0.61–1.24)
GFR, Estimated: >60 mL/min (ref >60)
Glucose, Bld: 135 mg/dL — ABNORMAL HIGH (ref 70–99)
Potassium: 3.8 mmol/L (ref 3.5–5.1)
Sodium: 136 mmol/L (ref 135–145)
Total Bilirubin: 0.7 mg/dL (ref 0.3–1.2)
Total Protein: 8.3 g/dL — ABNORMAL HIGH (ref 6.5–8.1)

## 2021-03-01 LAB — CBC WITH DIFFERENTIAL/PLATELET
Abs Immature Granulocytes: 0.02 10*3/uL (ref 0.00–0.07)
Basophils Absolute: 0.1 10*3/uL (ref 0.0–0.1)
Basophils Relative: 1 %
Eosinophils Absolute: 0.2 10*3/uL (ref 0.0–0.5)
Eosinophils Relative: 3 %
HCT: 36.5 % — ABNORMAL LOW (ref 39.0–52.0)
Hemoglobin: 12.6 g/dL — ABNORMAL LOW (ref 13.0–17.0)
Immature Granulocytes: 0 %
Lymphocytes Relative: 39 %
Lymphs Abs: 3.3 10*3/uL (ref 0.7–4.0)
MCH: 30 pg (ref 26.0–34.0)
MCHC: 34.5 g/dL (ref 30.0–36.0)
MCV: 86.9 fL (ref 80.0–100.0)
Monocytes Absolute: 0.6 10*3/uL (ref 0.1–1.0)
Monocytes Relative: 7 %
Neutro Abs: 4.3 10*3/uL (ref 1.7–7.7)
Neutrophils Relative %: 50 %
Platelets: 255 10*3/uL (ref 150–400)
RBC: 4.2 MIL/uL — ABNORMAL LOW (ref 4.22–5.81)
RDW: 13.1 % (ref 11.5–15.5)
WBC: 8.5 10*3/uL (ref 4.0–10.5)
nRBC: 0 % (ref 0.0–0.2)

## 2021-03-01 LAB — LACTIC ACID, PLASMA: Lactic Acid, Venous: 0.9 mmol/L (ref 0.5–1.9)

## 2021-03-01 MED ORDER — CLINDAMYCIN HCL 150 MG PO CAPS
600.0000 mg | ORAL_CAPSULE | Freq: Once | ORAL | Status: AC
  Administered 2021-03-01: 600 mg via ORAL
  Filled 2021-03-01: qty 4

## 2021-03-01 MED ORDER — CLINDAMYCIN HCL 300 MG PO CAPS: 300.0000 mg | ORAL_CAPSULE | Freq: Three times a day (TID) | ORAL | 0 refills | Status: AC

## 2021-03-01 NOTE — ED Provider Notes (Signed)
[facility] Provider Note    Event Date/Time   First MD Initiated Contact with Patient 03/01/21 2114     (approximate)  History   Chief Complaint: Insect Bite and Withdrawal  HPI  Lance Marquez is a 26 y.o. male with a past medical history of substance abuse presents to the emergency department for left foot pain and swelling.  According to the patient has a history of IV substance abuse including heroin and methamphetamine.  Patient states he just got out of prison 5 days ago after being in prison for 1 year.  States since being out he has relapsed using IV methamphetamine and heroin once again.  Patient states over the past several days he has been experiencing increased pain and swelling in his left foot.  Patient is also developed scabbing over his face and arms.  Patient denies any known fever.  Physical Exam   Triage Vital Signs: ED Triage Vitals  Enc Vitals Group     BP 03/01/21 2026 132/68     Pulse Rate 03/01/21 2026 98     Resp 03/01/21 2026 20     Temp 03/01/21 2026 98 F (36.7 C)     Temp Source 03/01/21 2026 Oral     SpO2 03/01/21 2026 99 %     Weight 03/01/21 2027 170 lb (77.1 kg)     Height 03/01/21 2027 5\' 9"  (1.753 m)     Head Circumference --      Peak Flow --      Pain Score 03/01/21 2027 8     Pain Loc --      Pain Edu? --      Excl. in GC? --     Most recent vital signs: Vitals:   03/01/21 2026  BP: 132/68  Pulse: 98  Resp: 20  Temp: 98 F (36.7 C)  SpO2: 99%    General: Awake, no distress.  CV:  Good peripheral perfusion.  Regular rate and rhythm  Resp:  Normal effort.  Equal breath sounds bilaterally.  Abd:  No distention.  Soft, nontender.  No rebound or guarding. Other:  Moderate swelling mild erythema and moderate tenderness of the left foot especially to the dorsal aspect.  Patient also has significant scabbing to his face and upper extremities   RADIOLOGY  X-ray on my evaluation images does not  appear to show any significant abnormality.  I do not see any gas. Radiologist read the x-ray is soft tissue swelling without acute bony abnormality  MEDICATIONS ORDERED IN ED: Medications - No data to display   IMPRESSION / MDM / ASSESSMENT AND PLAN / ED COURSE  I reviewed the triage vital signs and the nursing notes.  Patient presents emergency department for evaluation of left foot pain redness and swelling.  Patient states he recently relapsed using IV heroin and methamphetamine after being in jail for 1 year.  Patient denies injecting anything into the foot.  Given the left foot redness pain and swelling concern for cellulitis.  We will obtain an x-ray to rule out gas.  We will check labs to evaluate for signs of increased infection such as a CBC and a lactate.  We will continue to closely monitor while awaiting results.  Patient agreeable to plan of care.  X-rays negative for gas.  Patient does have swelling consistent with cellulitis.  Reassuringly patient's white blood cell count is normal.  Lactic acid is normal.  We will discharge with clindamycin crutches for  weightbearing as tolerated have the patient follow-up with his doctor.  I discussed return precautions.  Patient agreeable to plan  FINAL CLINICAL IMPRESSION(S) / ED DIAGNOSES   Left foot cellulitis  Rx / DC Orders   Clindamycin  Note:  This document was prepared using Dragon voice recognition software and may include unintentional dictation errors.   Minna Antis, MD 03/01/21 [phone]

## 2021-03-01 NOTE — ED Triage Notes (Signed)
Pt to ED pov for insect bite and withdrawal from heroin, last used yesterday. Pt states he recently got out of prison and relapsed and has started to have patches all over face, and back that started couple days ago. States that patches are painful and itching.   Pt believes he has been bit by spider, states he lives in [place] area. Upon assessment L foot is edematous and red.  Pt is A&ox4.

## 2021-03-22 ENCOUNTER — Inpatient Hospital Stay
Admission: EM | Admit: 2021-03-22 | Discharge: 2021-03-24 | DRG: 501 | Disposition: A | Discharge status: Home / Self Care | Payer: Self-pay | Attending: Internal Medicine | Admitting: Internal Medicine

## 2021-03-22 ENCOUNTER — Inpatient Hospital Stay: Payer: Self-pay | Admitting: Registered Nurse

## 2021-03-22 ENCOUNTER — Encounter
Admission: EM | Disposition: A | Discharge status: Home / Self Care | Payer: Self-pay | Source: Home / Self Care | Attending: Internal Medicine

## 2021-03-22 ENCOUNTER — Other Ambulatory Visit: Payer: Self-pay

## 2021-03-22 ENCOUNTER — Emergency Department: Payer: Self-pay

## 2021-03-22 DIAGNOSIS — F17210 Nicotine dependence, cigarettes, uncomplicated: Secondary | ICD-10-CM | POA: Diagnosis present

## 2021-03-22 DIAGNOSIS — D72829 Elevated white blood cell count, unspecified: Secondary | ICD-10-CM | POA: Diagnosis present

## 2021-03-22 DIAGNOSIS — E871 Hypo-osmolality and hyponatremia: Secondary | ICD-10-CM | POA: Diagnosis present

## 2021-03-22 DIAGNOSIS — F1990 Other psychoactive substance use, unspecified, uncomplicated: Secondary | ICD-10-CM | POA: Diagnosis present

## 2021-03-22 DIAGNOSIS — Z20822 Contact with and (suspected) exposure to covid-19: Secondary | ICD-10-CM | POA: Diagnosis present

## 2021-03-22 DIAGNOSIS — Z791 Long term (current) use of non-steroidal anti-inflammatories (NSAID): Secondary | ICD-10-CM

## 2021-03-22 DIAGNOSIS — F1590 Other stimulant use, unspecified, uncomplicated: Secondary | ICD-10-CM | POA: Diagnosis present

## 2021-03-22 DIAGNOSIS — F1190 Opioid use, unspecified, uncomplicated: Secondary | ICD-10-CM | POA: Diagnosis present

## 2021-03-22 DIAGNOSIS — M009 Pyogenic arthritis, unspecified: Principal | ICD-10-CM | POA: Diagnosis present

## 2021-03-22 DIAGNOSIS — J45909 Unspecified asthma, uncomplicated: Secondary | ICD-10-CM | POA: Diagnosis present

## 2021-03-22 DIAGNOSIS — Z79899 Other long term (current) drug therapy: Secondary | ICD-10-CM

## 2021-03-22 DIAGNOSIS — Z881 Allergy status to other antibiotic agents status: Secondary | ICD-10-CM

## 2021-03-22 HISTORY — PX: KNEE ARTHROSCOPY: SHX127

## 2021-03-22 LAB — CBC
HCT: 36.9 % — ABNORMAL LOW (ref 39.0–52.0)
Hemoglobin: 12.5 g/dL — ABNORMAL LOW (ref 13.0–17.0)
MCH: 29.6 pg (ref 26.0–34.0)
MCHC: 33.9 g/dL (ref 30.0–36.0)
MCV: 87.4 fL (ref 80.0–100.0)
Platelets: 263 10*3/uL (ref 150–400)
RBC: 4.22 MIL/uL (ref 4.22–5.81)
RDW: 14.6 % (ref 11.5–15.5)
WBC: 18.8 10*3/uL — ABNORMAL HIGH (ref 4.0–10.5)
nRBC: 0 % (ref 0.0–0.2)

## 2021-03-22 LAB — URINALYSIS, COMPLETE (UACMP) WITH MICROSCOPIC
Bacteria, UA: NONE SEEN
Bilirubin Urine: NEGATIVE
Glucose, UA: NEGATIVE mg/dL
Hgb urine dipstick: NEGATIVE
Ketones, ur: NEGATIVE mg/dL
Nitrite: NEGATIVE
Protein, ur: NEGATIVE mg/dL
Specific Gravity, Urine: 1.006 (ref 1.005–1.030)
Squamous Epithelial / HPF: NONE SEEN (ref 0–5)
pH: 7 (ref 5.0–8.0)

## 2021-03-22 LAB — SYNOVIAL CELL COUNT + DIFF, W/ CRYSTALS
Crystals, Fluid: NONE SEEN
Eosinophils-Synovial: 0 %
Lymphocytes-Synovial Fld: 4 %
Monocyte-Macrophage-Synovial Fluid: 2 %
Neutrophil, Synovial: 94 %
WBC, Synovial: 51792 /mm3 — ABNORMAL HIGH (ref 0–200)

## 2021-03-22 LAB — RESP PANEL BY RT-PCR (FLU A&B, COVID) ARPGX2
Influenza A by PCR: NEGATIVE
Influenza B by PCR: NEGATIVE
SARS Coronavirus 2 by RT PCR: NEGATIVE

## 2021-03-22 LAB — C-REACTIVE PROTEIN: CRP: 5 mg/dL — ABNORMAL HIGH (ref <1.0)

## 2021-03-22 LAB — BASIC METABOLIC PANEL
Anion gap: 7 (ref 5–15)
BUN: 15 mg/dL (ref 6–20)
CO2: 24 mmol/L (ref 22–32)
Calcium: 9.2 mg/dL (ref 8.9–10.3)
Chloride: 102 mmol/L (ref 98–111)
Creatinine, Ser: 0.79 mg/dL (ref 0.61–1.24)
GFR, Estimated: >60 mL/min (ref >60)
Glucose, Bld: 112 mg/dL — ABNORMAL HIGH (ref 70–99)
Potassium: 4.5 mmol/L (ref 3.5–5.1)
Sodium: 133 mmol/L — ABNORMAL LOW (ref 135–145)

## 2021-03-22 LAB — LACTIC ACID, PLASMA
Lactic Acid, Venous: 0.8 mmol/L (ref 0.5–1.9)
Lactic Acid, Venous: 1.4 mmol/L (ref 0.5–1.9)

## 2021-03-22 LAB — SEDIMENTATION RATE: Sed Rate: 59 mm/hr — ABNORMAL HIGH (ref 0–15)

## 2021-03-22 SURGERY — ARTHROSCOPY, KNEE
Anesthesia: General | Site: Knee | Laterality: Right

## 2021-03-22 MED ORDER — SODIUM CHLORIDE (PF) 0.9 % IJ SOLN
INTRAMUSCULAR | Status: AC
  Filled 2021-03-22: qty 50

## 2021-03-22 MED ORDER — IPRATROPIUM-ALBUTEROL 0.5-2.5 (3) MG/3ML IN SOLN: 3.0000 mL | Freq: Once | RESPIRATORY_TRACT | Status: AC

## 2021-03-22 MED ORDER — ONDANSETRON HCL 4 MG PO TABS: 4.0000 mg | ORAL_TABLET | Freq: Four times a day (QID) | ORAL | Status: DC | PRN

## 2021-03-22 MED ORDER — LACTATED RINGERS IV SOLN: INTRAVENOUS | Status: DC | PRN

## 2021-03-22 MED ORDER — ACETAMINOPHEN 500 MG PO TABS
1000.0000 mg | ORAL_TABLET | Freq: Four times a day (QID) | ORAL | Status: AC
  Administered 2021-03-23 (×4): 1000 mg via ORAL
  Filled 2021-03-22 (×4): qty 2

## 2021-03-22 MED ORDER — OXYCODONE HCL 5 MG PO TABS
10.0000 mg | ORAL_TABLET | ORAL | Status: DC | PRN
  Administered 2021-03-22 – 2021-03-23 (×2): 15 mg via ORAL
  Filled 2021-03-22 (×2): qty 3

## 2021-03-22 MED ORDER — MIDAZOLAM HCL 2 MG/2ML IJ SOLN
INTRAMUSCULAR | Status: DC | PRN
  Administered 2021-03-22: 2 mg via INTRAVENOUS

## 2021-03-22 MED ORDER — HYDROMORPHONE HCL 1 MG/ML IJ SOLN
INTRAMUSCULAR | Status: AC
  Filled 2021-03-22: qty 1

## 2021-03-22 MED ORDER — METHOCARBAMOL 1000 MG/10ML IJ SOLN
500.0000 mg | Freq: Four times a day (QID) | INTRAVENOUS | Status: DC | PRN
  Filled 2021-03-22: qty 5

## 2021-03-22 MED ORDER — KETOROLAC TROMETHAMINE 30 MG/ML IJ SOLN
INTRAMUSCULAR | Status: DC | PRN
  Administered 2021-03-22: 30 mg via INTRAVENOUS

## 2021-03-22 MED ORDER — SUCCINYLCHOLINE CHLORIDE 200 MG/10ML IV SOSY
PREFILLED_SYRINGE | INTRAVENOUS | Status: AC
  Filled 2021-03-22: qty 10

## 2021-03-22 MED ORDER — DOCUSATE SODIUM 100 MG PO CAPS
100.0000 mg | ORAL_CAPSULE | Freq: Two times a day (BID) | ORAL | Status: DC
  Administered 2021-03-23: 100 mg via ORAL
  Filled 2021-03-22 (×4): qty 1

## 2021-03-22 MED ORDER — BUPIVACAINE LIPOSOME 1.3 % IJ SUSP
INTRAMUSCULAR | Status: AC
  Filled 2021-03-22: qty 20

## 2021-03-22 MED ORDER — LACTATED RINGERS IV BOLUS (SEPSIS): 500.0000 mL | Freq: Once | INTRAVENOUS | Status: DC

## 2021-03-22 MED ORDER — OXYCODONE HCL 5 MG/5ML PO SOLN: 5.0000 mg | Freq: Once | ORAL | Status: DC | PRN

## 2021-03-22 MED ORDER — KETOROLAC TROMETHAMINE 30 MG/ML IJ SOLN
INTRAMUSCULAR | Status: AC
  Filled 2021-03-22: qty 1

## 2021-03-22 MED ORDER — CLINDAMYCIN PHOSPHATE 900 MG/50ML IV SOLN
900.0000 mg | Freq: Three times a day (TID) | INTRAVENOUS | Status: DC
  Administered 2021-03-23: 900 mg via INTRAVENOUS
  Filled 2021-03-22 (×2): qty 50

## 2021-03-22 MED ORDER — LIDOCAINE-EPINEPHRINE 1 %-1:100000 IJ SOLN
INTRAMUSCULAR | Status: DC | PRN
  Administered 2021-03-22: 10 mL via INTRADERMAL

## 2021-03-22 MED ORDER — METHOCARBAMOL 500 MG PO TABS: 500.0000 mg | ORAL_TABLET | Freq: Four times a day (QID) | ORAL | Status: DC | PRN

## 2021-03-22 MED ORDER — FENTANYL CITRATE (PF) 100 MCG/2ML IJ SOLN
INTRAMUSCULAR | Status: AC
  Filled 2021-03-22: qty 2

## 2021-03-22 MED ORDER — SODIUM CHLORIDE 0.9 % IV SOLN
2.0000 g | INTRAVENOUS | Status: DC
  Filled 2021-03-22: qty 20

## 2021-03-22 MED ORDER — ONDANSETRON HCL 4 MG/2ML IJ SOLN: 4.0000 mg | Freq: Four times a day (QID) | INTRAMUSCULAR | Status: DC | PRN

## 2021-03-22 MED ORDER — ACETAMINOPHEN 325 MG PO TABS: 650.0000 mg | ORAL_TABLET | Freq: Four times a day (QID) | ORAL | Status: DC | PRN

## 2021-03-22 MED ORDER — HYDROMORPHONE HCL 1 MG/ML IJ SOLN
INTRAMUSCULAR | Status: DC | PRN
  Administered 2021-03-22: .25 mg via INTRAVENOUS
  Administered 2021-03-22: .5 mg via INTRAVENOUS
  Administered 2021-03-22: .25 mg via INTRAVENOUS

## 2021-03-22 MED ORDER — BUPIVACAINE HCL (PF) 0.5 % IJ SOLN
INTRAMUSCULAR | Status: AC
  Filled 2021-03-22: qty 30

## 2021-03-22 MED ORDER — OXYCODONE HCL 5 MG PO TABS: 5.0000 mg | ORAL_TABLET | Freq: Once | ORAL | Status: DC | PRN

## 2021-03-22 MED ORDER — PROPOFOL 10 MG/ML IV BOLUS
INTRAVENOUS | Status: DC | PRN
  Administered 2021-03-22: 150 mg via INTRAVENOUS
  Administered 2021-03-22 (×2): 50 mg via INTRAVENOUS

## 2021-03-22 MED ORDER — ENOXAPARIN SODIUM 40 MG/0.4ML IJ SOSY: 40.0000 mg | PREFILLED_SYRINGE | INTRAMUSCULAR | Status: DC

## 2021-03-22 MED ORDER — SUCCINYLCHOLINE CHLORIDE 200 MG/10ML IV SOSY
PREFILLED_SYRINGE | INTRAVENOUS | Status: DC | PRN
  Administered 2021-03-22: 100 mg via INTRAVENOUS

## 2021-03-22 MED ORDER — DEXMEDETOMIDINE HCL IN NACL 200 MCG/50ML IV SOLN
INTRAVENOUS | Status: AC
  Filled 2021-03-22: qty 50

## 2021-03-22 MED ORDER — SODIUM CHLORIDE 0.9 % IV SOLN
2.0000 g | Freq: Once | INTRAVENOUS | Status: AC
  Administered 2021-03-22: 2 g via INTRAVENOUS
  Filled 2021-03-22 (×2): qty 20

## 2021-03-22 MED ORDER — LIDOCAINE-EPINEPHRINE 1 %-1:100000 IJ SOLN
INTRAMUSCULAR | Status: AC
  Filled 2021-03-22: qty 1

## 2021-03-22 MED ORDER — PROPOFOL 10 MG/ML IV BOLUS
INTRAVENOUS | Status: AC
  Filled 2021-03-22: qty 20

## 2021-03-22 MED ORDER — CLINDAMYCIN PHOSPHATE 600 MG/50ML IV SOLN
INTRAVENOUS | Status: AC
  Filled 2021-03-22: qty 50

## 2021-03-22 MED ORDER — ONDANSETRON HCL 4 MG/2ML IJ SOLN: 4.0000 mg | Freq: Once | INTRAMUSCULAR | Status: DC | PRN

## 2021-03-22 MED ORDER — LINEZOLID 600 MG/300ML IV SOLN
600.0000 mg | Freq: Two times a day (BID) | INTRAVENOUS | Status: DC
  Administered 2021-03-22 – 2021-03-24 (×4): 600 mg via INTRAVENOUS
  Filled 2021-03-22 (×5): qty 300

## 2021-03-22 MED ORDER — MIDAZOLAM HCL 2 MG/2ML IJ SOLN
INTRAMUSCULAR | Status: AC
  Filled 2021-03-22: qty 2

## 2021-03-22 MED ORDER — HYDROMORPHONE HCL 1 MG/ML IJ SOLN: 0.5000 mg | INTRAMUSCULAR | Status: DC | PRN

## 2021-03-22 MED ORDER — ONDANSETRON HCL 4 MG/2ML IJ SOLN
INTRAMUSCULAR | Status: AC
  Filled 2021-03-22: qty 2

## 2021-03-22 MED ORDER — GLYCOPYRROLATE 0.2 MG/ML IJ SOLN: INTRAMUSCULAR | Status: DC | PRN

## 2021-03-22 MED ORDER — LACTATED RINGERS IV BOLUS (SEPSIS): 1000.0000 mL | Freq: Once | INTRAVENOUS | Status: DC

## 2021-03-22 MED ORDER — SENNOSIDES-DOCUSATE SODIUM 8.6-50 MG PO TABS: 1.0000 | ORAL_TABLET | Freq: Every evening | ORAL | Status: DC | PRN

## 2021-03-22 MED ORDER — 0.9 % SODIUM CHLORIDE (POUR BTL) OPTIME
TOPICAL | Status: DC | PRN
  Administered 2021-03-22: 100 mL

## 2021-03-22 MED ORDER — FENTANYL CITRATE (PF) 100 MCG/2ML IJ SOLN
INTRAMUSCULAR | Status: DC | PRN
  Administered 2021-03-22: 100 ug via INTRAVENOUS

## 2021-03-22 MED ORDER — SODIUM CHLORIDE 0.9 % IV SOLN: INTRAVENOUS | Status: DC

## 2021-03-22 MED ORDER — NAPROXEN 500 MG PO TABS
500.0000 mg | ORAL_TABLET | Freq: Two times a day (BID) | ORAL | Status: DC
  Administered 2021-03-23 – 2021-03-24 (×2): 500 mg via ORAL
  Filled 2021-03-22 (×3): qty 1

## 2021-03-22 MED ORDER — ACETAMINOPHEN 10 MG/ML IV SOLN
1000.0000 mg | Freq: Once | INTRAVENOUS | Status: DC | PRN
  Administered 2021-03-22: 1000 mg via INTRAVENOUS

## 2021-03-22 MED ORDER — LACTATED RINGERS IR SOLN
Status: DC | PRN
Start: 2021-03-22 — End: 2021-03-22
  Administered 2021-03-22: 6000 mL

## 2021-03-22 MED ORDER — DIPHENHYDRAMINE HCL 12.5 MG/5ML PO ELIX: 12.5000 mg | ORAL_SOLUTION | ORAL | Status: DC | PRN

## 2021-03-22 MED ORDER — DEXAMETHASONE SODIUM PHOSPHATE 10 MG/ML IJ SOLN
INTRAMUSCULAR | Status: DC | PRN
  Administered 2021-03-22: 10 mg via INTRAVENOUS

## 2021-03-22 MED ORDER — CLINDAMYCIN PHOSPHATE 600 MG/50ML IV SOLN
INTRAVENOUS | Status: DC | PRN
  Administered 2021-03-22: 600 mg via INTRAVENOUS

## 2021-03-22 MED ORDER — HYDROMORPHONE HCL 1 MG/ML IJ SOLN: 0.2000 mg | INTRAMUSCULAR | Status: DC | PRN

## 2021-03-22 MED ORDER — LIDOCAINE HCL 1 % IJ SOLN
5.0000 mL | Freq: Once | INTRAMUSCULAR | Status: AC
Start: 2021-03-22 — End: 2021-03-22
  Administered 2021-03-22: 5 mL
  Filled 2021-03-22: qty 10

## 2021-03-22 MED ORDER — IPRATROPIUM-ALBUTEROL 0.5-2.5 (3) MG/3ML IN SOLN
RESPIRATORY_TRACT | Status: AC
  Administered 2021-03-22: 3 mL via RESPIRATORY_TRACT
  Filled 2021-03-22: qty 3

## 2021-03-22 MED ORDER — DEXMEDETOMIDINE (PRECEDEX) IN NS 20 MCG/5ML (4 MCG/ML) IV SYRINGE
PREFILLED_SYRINGE | INTRAVENOUS | Status: DC | PRN
  Administered 2021-03-22: 8 ug via INTRAVENOUS

## 2021-03-22 MED ORDER — ENOXAPARIN SODIUM 40 MG/0.4ML IJ SOSY
40.0000 mg | PREFILLED_SYRINGE | INTRAMUSCULAR | Status: DC
  Administered 2021-03-23 – 2021-03-24 (×2): 40 mg via SUBCUTANEOUS
  Filled 2021-03-22 (×2): qty 0.4

## 2021-03-22 MED ORDER — ONDANSETRON HCL 4 MG/2ML IJ SOLN
INTRAMUSCULAR | Status: DC | PRN
Start: 2021-03-22 — End: 2021-03-22
  Administered 2021-03-22: 4 mg via INTRAVENOUS

## 2021-03-22 MED ORDER — ACETAMINOPHEN 650 MG RE SUPP: 650.0000 mg | Freq: Four times a day (QID) | RECTAL | Status: DC | PRN

## 2021-03-22 MED ORDER — LIDOCAINE HCL (CARDIAC) PF 100 MG/5ML IV SOSY
PREFILLED_SYRINGE | INTRAVENOUS | Status: DC | PRN
  Administered 2021-03-22: 50 mg via INTRAVENOUS

## 2021-03-22 MED ORDER — VANCOMYCIN HCL IN DEXTROSE 1-5 GM/200ML-% IV SOLN
INTRAVENOUS | Status: AC
  Filled 2021-03-22: qty 200

## 2021-03-22 MED ORDER — OXYCODONE HCL 5 MG PO TABS
5.0000 mg | ORAL_TABLET | ORAL | Status: DC | PRN
  Administered 2021-03-24: 5 mg via ORAL
  Filled 2021-03-22: qty 1

## 2021-03-22 MED ORDER — LIDOCAINE HCL (PF) 2 % IJ SOLN
INTRAMUSCULAR | Status: AC
  Filled 2021-03-22: qty 5

## 2021-03-22 MED ORDER — KETOROLAC TROMETHAMINE 15 MG/ML IJ SOLN
15.0000 mg | Freq: Four times a day (QID) | INTRAMUSCULAR | Status: AC
  Administered 2021-03-23 (×4): 15 mg via INTRAVENOUS
  Filled 2021-03-22 (×4): qty 1

## 2021-03-22 MED ORDER — ACETAMINOPHEN 10 MG/ML IV SOLN
INTRAVENOUS | Status: AC
  Filled 2021-03-22: qty 100

## 2021-03-22 MED ORDER — DEXAMETHASONE SODIUM PHOSPHATE 10 MG/ML IJ SOLN
INTRAMUSCULAR | Status: AC
  Filled 2021-03-22: qty 1

## 2021-03-22 MED ORDER — FENTANYL CITRATE (PF) 100 MCG/2ML IJ SOLN: 25.0000 ug | INTRAMUSCULAR | Status: DC | PRN

## 2021-03-22 SURGICAL SUPPLY — 47 items
APL PRP STRL LF DISP 70% ISPRP (MISCELLANEOUS) ×2
BLADE FULL RADIUS 3.5 (BLADE) IMPLANT
BLADE SHAVER 4.5X7 STR FR (MISCELLANEOUS) ×1 IMPLANT
BLADE SURG SZ11 CARB STEEL (BLADE) ×3 IMPLANT
BNDG ESMARK 6X12 TAN STRL LF (GAUZE/BANDAGES/DRESSINGS) ×1 IMPLANT
BUR BR 5.5 WIDE MOUTH (BURR) IMPLANT
CHLORAPREP W/TINT 26 (MISCELLANEOUS) ×4 IMPLANT
COOLER POLAR GLACIER W/PUMP (MISCELLANEOUS) ×3 IMPLANT
CUFF TOURN SGL QUICK 24 (TOURNIQUET CUFF) ×2
CUFF TRNQT CYL 24X4X16.5-23 (TOURNIQUET CUFF) IMPLANT
DRAPE ARTHRO LIMB 89X125 STRL (DRAPES) ×3 IMPLANT
DRAPE IMP U-DRAPE 54X76 (DRAPES) ×3 IMPLANT
GAUZE SPONGE 4X4 12PLY STRL (GAUZE/BANDAGES/DRESSINGS) ×3 IMPLANT
GLOVE SRG 8 PF TXTR STRL LF DI (GLOVE) ×2 IMPLANT
GLOVE SURG ORTHO LTX SZ8 (GLOVE) ×3 IMPLANT
GLOVE SURG SYN 7.5  E (GLOVE) ×8
GLOVE SURG SYN 7.5 E (GLOVE) ×4 IMPLANT
GLOVE SURG SYN 7.5 PF PI (GLOVE) ×2 IMPLANT
GLOVE SURG UNDER POLY LF SZ8 (GLOVE) ×4
GOWN STRL REUS W/ TWL LRG LVL3 (GOWN DISPOSABLE) ×2 IMPLANT
GOWN STRL REUS W/TWL LRG LVL3 (GOWN DISPOSABLE) ×2
IV LACTATED RINGER IRRG 3000ML (IV SOLUTION) ×4
IV LR IRRIG 3000ML ARTHROMATIC (IV SOLUTION) ×8 IMPLANT
KIT TURNOVER KIT A (KITS) ×3 IMPLANT
MANIFOLD NEPTUNE II (INSTRUMENTS) ×6 IMPLANT
MAT ABSORB  FLUID 56X50 GRAY (MISCELLANEOUS) ×2
MAT ABSORB FLUID 56X50 GRAY (MISCELLANEOUS) ×2 IMPLANT
NEEDLE HYPO 22GX1.5 SAFETY (NEEDLE) ×3 IMPLANT
PACK ARTHROSCOPY KNEE (MISCELLANEOUS) ×3 IMPLANT
PAD ABD DERMACEA PRESS 5X9 (GAUZE/BANDAGES/DRESSINGS) ×5 IMPLANT
PAD WRAPON POLAR KNEE (MISCELLANEOUS) ×2 IMPLANT
PENCIL SMOKE EVACUATOR (MISCELLANEOUS) ×3 IMPLANT
SPONGE T-LAP 18X18 ~~LOC~~+RFID (SPONGE) ×3 IMPLANT
STRIP CLOSURE SKIN 1/2X4 (GAUZE/BANDAGES/DRESSINGS) ×2 IMPLANT
SUT ETHILON 3-0 FS-10 30 BLK (SUTURE) ×2
SUT MNCRL 4-0 (SUTURE)
SUT MNCRL 4-0 27XMFL (SUTURE)
SUT VIC AB 0 CT1 36 (SUTURE) IMPLANT
SUT VIC AB 2-0 CT2 27 (SUTURE) IMPLANT
SUTURE EHLN 3-0 FS-10 30 BLK (SUTURE) ×2 IMPLANT
SUTURE MNCRL 4-0 27XMF (SUTURE) IMPLANT
TUBING INFLOW SET DBFLO PUMP (TUBING) ×3 IMPLANT
TUBING OUTFLOW SET DBLFO PUMP (TUBING) ×3 IMPLANT
WAND HAND CNTRL MULTIVAC 50 (MISCELLANEOUS) IMPLANT
WAND WEREWOLF FLOW 90D (MISCELLANEOUS) ×1 IMPLANT
WATER STERILE IRR 500ML POUR (IV SOLUTION) ×3 IMPLANT
WRAPON POLAR PAD KNEE (MISCELLANEOUS) ×2

## 2021-03-22 NOTE — Progress Notes (Signed)
OR findings were not suggestive of septic arthritis.  Given the aspirate results and intraoperative findings without significant pathology, the patient may have another underlying inflammatory process of the knee.  -Recommend following cultures from both ED aspiration and to ensure there is no growth -Can continue IV antibiotics in case cultures do turn positive.  Can discontinue if cultures are negative. -May recommend rheumatology consult either as an inpatient or outpatient.

## 2021-03-22 NOTE — Anesthesia Procedure Notes (Signed)
Procedure Name: Intubation Date/Time: 03/22/2021 8:09 PM Performed by: Rolla Plate, CRNA Pre-anesthesia Checklist: Patient identified, Patient being monitored, Timeout performed, Emergency Drugs available and Suction available Patient Re-evaluated:Patient Re-evaluated prior to induction Oxygen Delivery Method: Circle system utilized Preoxygenation: Pre-oxygenation with 100% oxygen Induction Type: IV induction and Rapid sequence Laryngoscope Size: McGraph and 4 Grade View: Grade I Tube type: Oral Tube size: 7.5 mm Number of attempts: 1 Airway Equipment and Method: Stylet and Video-laryngoscopy Placement Confirmation: ETT inserted through vocal cords under direct vision, positive ETCO2 and breath sounds checked- equal and bilateral Secured at: 22 cm Tube secured with: Tape Dental Injury: Teeth and Oropharynx as per pre-operative assessment

## 2021-03-22 NOTE — Anesthesia Postprocedure Evaluation (Signed)
Anesthesia Post Note  Patient: Lance Marquez  Procedure(s) Performed: ARTHROSCOPY KNEE (Right: Knee)  Patient location during evaluation: PACU Anesthesia Type: General Level of consciousness: awake and alert Pain management: pain level controlled Vital Signs Assessment: post-procedure vital signs reviewed and stable Respiratory status: spontaneous breathing, nonlabored ventilation, respiratory function stable and patient connected to nasal cannula oxygen Cardiovascular status: blood pressure returned to baseline and stable Postop Assessment: no apparent nausea or vomiting Anesthetic complications: no   No notable events documented.   Last Vitals:  Vitals:   03/22/21 2155 03/22/21 2204  BP: 121/75 121/75  Pulse: 80 80  Resp: 15 15  Temp: 36.6 C 36.6 C  SpO2: 100%     Last Pain:  Vitals:   03/22/21 2204  TempSrc: Oral  PainSc:                  Corinda Gubler

## 2021-03-22 NOTE — Transfer of Care (Signed)
Immediate Anesthesia Transfer of Care Note  Patient: Lance Marquez  Procedure(s) Performed: ARTHROSCOPY KNEE (Right: Knee)  Patient Location: PACU  Anesthesia Type:General  Level of Consciousness: awake  Airway & Oxygen Therapy: Patient Spontanous Breathing  Post-op Assessment: Report given to RN and Post -op Vital signs reviewed and stable  Post vital signs: Reviewed  Last Vitals:  Vitals Value Taken Time  BP    Temp    Pulse 95 03/22/21 2054  Resp 12 03/22/21 2054  SpO2 100 % 03/22/21 2054  Vitals shown include unvalidated device data.  Last Pain:  Vitals:   03/22/21 1812  PainSc: 4          Complications: No notable events documented.

## 2021-03-22 NOTE — ED Triage Notes (Signed)
Pt to ED for possible cellulitis to right knee, states was here recently for same left foot. Last used heroin and meth 2 days ago, states injects but has not injected to lower extremities.  Denies fevers.

## 2021-03-22 NOTE — H&P (Signed)
History and Physical    Lance Marquez YIF:[id number] DOB: 05/24/1995 DOA: 03/22/2021  PCP: Pcp, No  Patient coming from: Home  I have personally briefly reviewed patient's old medical records in [facility]  Chief Complaint: Right knee pain  HPI: Lance Marquez is a 26 y.o. male with medical history significant of IVDU presented to ED with a complaint of right knee pain which started 3 days ago.  This morning, he noticed that he is unable to flex his knee so he came to ER for medical attention.  He denies any fever, chills, sweating or any other complaint.  He tells me that about a month ago when [facility] was [facility], he had left foot cellulitis, he came to the emergency department, prescribed clindamycin which he took and while he was still taking them, he developed right foot cellulitis but that resolved on its own.  His last IVDU was 15 hours ago.  No previous history of bacteremia or endocarditis.  ED Course: Upon arrival, he was slightly tachycardic but otherwise hemodynamically stable.  Had leukocytosis.  Right knee arthrocentesis was done and fluid analysis shows 51,000 white blood cells.  Case was discussed with Dr. Allena Katz of orthopedics who has planned to take this patient to the OR shortly to do washout.  Hospitalist were called for admission.  Review of Systems: As per HPI otherwise negative.    Past Medical History:  Diagnosis Date   Asthma    C. difficile diarrhea    Diarrhea    Drug abuse and dependence (HCC)    Parasites in stool    Suicidal behavior     History reviewed. No pertinent surgical history.   reports that he has been smoking cigarettes. He has never used smokeless tobacco. He reports current alcohol use. He reports current drug use. Drugs: IV and Methamphetamines.  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  Prior to Admission medications    Medication Sig Start Date End Date Taking? Authorizing Provider  albuterol (PROVENTIL) (2.5 MG/3ML) 0.083% nebulizer solution Inhale 2.5 mg into the lungs every 6 (six) hours as needed.    [provider]  naproxen (NAPROSYN) 500 MG tablet Take 1 tablet (500 mg total) by mouth 2 (two) times daily with a meal. 04/17/20   Joni Reining, PA-C  ondansetron (ZOFRAN) 8 MG tablet Take 1 tablet (8 mg total) by mouth every 8 (eight) hours as needed for nausea or vomiting. 04/17/20   Joni Reining, PA-C  orphenadrine (NORFLEX) 100 MG tablet Take 1 tablet (100 mg total) by mouth 2 (two) times daily. 04/17/20   Joni Reining, PA-C    Physical Exam: Vitals:   03/22/21 1630 03/22/21 1700 03/22/21 1730 03/22/21 1745  BP: 125/81 101/61 121/70   Pulse: 88 87 100   Resp:      Temp:      SpO2: 99% 100% 97% 99%  Weight:      Height:        Constitutional: NAD, calm, comfortable Vitals:   03/22/21 1630 03/22/21 1700 03/22/21 1730 03/22/21 1745  BP: 125/81 101/61 121/70   Pulse: 88 87 100   Resp:      Temp:      SpO2: 99% 100% 97% 99%  Weight:      Height:       Eyes: PERRL, lids and conjunctivae normal ENMT:  Mucous membranes are moist. Posterior pharynx clear of any exudate or lesions.Normal dentition.  Neck: normal, supple, no masses, no thyromegaly Respiratory: clear to auscultation bilaterally, no wheezing, no crackles. Normal respiratory effort. No accessory muscle use.  Cardiovascular: Regular rate and rhythm, no murmurs / rubs / gallops. No extremity edema. 2+ pedal pulses. No carotid bruits.  Abdomen: no tenderness, no masses palpated. No hepatosplenomegaly. Bowel sounds positive.  Musculoskeletal: no clubbing / cyanosis. No joint deformity upper and lower extremities. Good ROM, no contractures. Normal muscle tone.  Skin: no rashes, lesions, ulcers. No induration Neurologic: CN 2-12 grossly intact. Sensation intact, DTR normal. Strength 5/5 in all 4.  Psychiatric: Normal judgment  and insight. Alert and oriented x 3. Normal mood.    Labs on Admission: I have personally reviewed following labs and imaging studies  CBC: Recent Labs  Lab 03/22/21 1350  WBC 18.8*  HGB 12.5*  HCT 36.9*  MCV 87.4  PLT 263   Basic Metabolic Panel: Recent Labs  Lab 03/22/21 1350  NA 133*  K 4.5  CL 102  CO2 24  GLUCOSE 112*  BUN 15  CREATININE 0.79  CALCIUM 9.2   GFR: Estimated Creatinine Clearance: 141.2 mL/min (by C-G formula based on SCr of 0.79 mg/dL). Liver Function Tests: No results for input(s): AST, ALT, ALKPHOS, BILITOT, PROT, ALBUMIN in the last 168 hours. No results for input(s): LIPASE, AMYLASE in the last 168 hours. No results for input(s): AMMONIA in the last 168 hours. Coagulation Profile: No results for input(s): INR, PROTIME in the last 168 hours. Cardiac Enzymes: No results for input(s): CKTOTAL, CKMB, CKMBINDEX, TROPONINI in the last 168 hours. BNP (last 3 results) No results for input(s): PROBNP in the last 8760 hours. HbA1C: No results for input(s): HGBA1C in the last 72 hours. CBG: No results for input(s): GLUCAP in the last 168 hours. Lipid Profile: No results for input(s): CHOL, HDL, LDLCALC, TRIG, CHOLHDL, LDLDIRECT in the last 72 hours. Thyroid Function Tests: No results for input(s): TSH, T4TOTAL, FREET4, T3FREE, THYROIDAB in the last 72 hours. Anemia Panel: No results for input(s): VITAMINB12, FOLATE, FERRITIN, TIBC, IRON, RETICCTPCT in the last 72 hours. Urine analysis:    Component Value Date/Time   COLORURINE STRAW (A) 03/22/2021 1543   APPEARANCEUR CLEAR (A) 03/22/2021 1543   APPEARANCEUR Cloudy 08/17/2013 0359   LABSPEC 1.006 03/22/2021 1543   LABSPEC 1.021 08/17/2013 0359   PHURINE 7.0 03/22/2021 1543   GLUCOSEU NEGATIVE 03/22/2021 1543   GLUCOSEU Negative 08/17/2013 0359   HGBUR NEGATIVE 03/22/2021 1543   BILIRUBINUR NEGATIVE 03/22/2021 1543   BILIRUBINUR Negative 08/17/2013 0359   KETONESUR NEGATIVE 03/22/2021 1543    PROTEINUR NEGATIVE 03/22/2021 1543   UROBILINOGEN 1.0 06/22/2009 0657   NITRITE NEGATIVE 03/22/2021 1543   LEUKOCYTESUR TRACE (A) 03/22/2021 1543   LEUKOCYTESUR Negative 08/17/2013 0359    Radiological Exams on Admission: DG Knee Complete 4 Views Right  Result Date: 03/22/2021 CLINICAL DATA:  Septic knee. EXAM: RIGHT KNEE - COMPLETE 4+ VIEW COMPARISON:  None. FINDINGS: No evidence of fracture, dislocation, or joint effusion. No evidence of arthropathy or other focal bone abnormality. Soft tissues are unremarkable. IMPRESSION: Negative. Electronically Signed   By: Lupita Raider M.D.   On: 03/22/2021 16:11     Assessment/Plan Principal Problem:   Septic arthritis of knee (HCC)   Sepsis secondary to right knee septic arthritis: He does not have any history of trauma.  No overlying cellulitis.  Restricted range of motion with flexion.  Plan for  OR today with orthopedics.  Per ED physician, orthopedics has prohibited from giving him any antibiotics until after OR washout.  Blood cultures are drawn.  IVDU: Last drug use about 15 hours ago.  No signs of withdrawal.  May need medication tomorrow for withdrawal.  DVT prophylaxis: enoxaparin (LOVENOX) injection 40 mg Start: 03/23/21 1200 Code Status: Full code Family Communication: None present at bedside.  Plan of care discussed with patient in length and he verbalized understanding and agreed with it. Disposition Plan: Home when medically stable Consults called: Orthopedics  Hughie Closs MD [facility] Please page via Amion and do not message via secure chat for urgent patient care matters. Secure chat can be used for non urgent patient care matters. 03/22/2021, 5:56 PM  To contact the attending provider between 7A-7P or the covering provider during after hours 7P-7A, please log into the web site [email or web address]

## 2021-03-22 NOTE — ED Provider Notes (Signed)
[facility] Provider Note  Patient Contact: 3:20 PM (approximate)   History   Wound Infection   HPI  Lance Marquez is a 26 y.o. male with a history of substance abuse, cellulitis, C. difficile and asthma, presents to the emergency department with progressively worsening right knee swelling for the past 3 days.  Patient denies falls or mechanisms of trauma.  Patient states that he can barely flex and extend today and became concerned.  He has not noticed any erythema overlying the right knee.  He states that he has had some chills at home as well as dysuria and is uncertain of fever.  Denies any history of septic joints in the past.      Physical Exam   Triage Vital Signs: ED Triage Vitals  Enc Vitals Group     BP 03/22/21 1348 (!) 148/86     Pulse Rate 03/22/21 1348 (!) 105     Resp 03/22/21 1348 18     Temp 03/22/21 1348 98 F (36.7 C)     Temp src --      SpO2 03/22/21 1348 98 %     Weight 03/22/21 1349 171 lb 15.3 oz (78 kg)     Height 03/22/21 1349 5\' 9"  (1.753 m)     Head Circumference --      Peak Flow --      Pain Score 03/22/21 1349 5     Pain Loc --      Pain Edu? --      Excl. in [place]? --     Most recent vital signs: Vitals:   03/22/21 1730 03/22/21 1745  BP: 121/70   Pulse: 100 83  Resp:    Temp:    SpO2: 97% 99%     General: Alert and in no acute distress. Eyes:  PERRL. EOMI. Head: No acute traumatic findings ENT:      Ears:       Nose: No congestion/rhinnorhea.      Mouth/Throat: Mucous membranes are moist. Neck: No stridor. No cervical spine tenderness to palpation. Cardiovascular:  Good peripheral perfusion Respiratory: Normal respiratory effort without tachypnea or retractions. Lungs CTAB. Good air entry to the bases with no decreased or absent breath sounds. Gastrointestinal: Bowel sounds 4 quadrants. Soft and nontender to palpation. No guarding or rigidity. No palpable masses. No distention. No CVA  tenderness. Musculoskeletal: Patient has no erythema overlying the right knee.  Positive ballottement and loss of peripatellar dumpling.  Patient does have pain with flexion and extension.  Palpable dorsalis pedis pulse bilaterally and symmetrically. Neurologic:  No gross focal neurologic deficits are appreciated.  Skin:   No rash noted Other:   ED Results / Procedures / Treatments   Labs (all labs ordered are listed, but only abnormal results are displayed) Labs Reviewed  CBC - Abnormal; Notable for the following components:      Result Value   WBC 18.8 (*)    Hemoglobin 12.5 (*)    HCT 36.9 (*)    All other components within normal limits  BASIC METABOLIC PANEL - Abnormal; Notable for the following components:   Sodium 133 (*)    Glucose, Bld 112 (*)    All other components within normal limits  SEDIMENTATION RATE - Abnormal; Notable for the following components:   Sed Rate 59 (*)    All other components within normal limits  SYNOVIAL CELL COUNT + DIFF, W/ CRYSTALS - Abnormal; Notable for the following components:  Color, Synovial YELLOW (*)    Appearance-Synovial CLOUDY (*)    WBC, Synovial 51,792 (*)    All other components within normal limits  URINALYSIS, COMPLETE (UACMP) WITH MICROSCOPIC - Abnormal; Notable for the following components:   Color, Urine STRAW (*)    APPearance CLEAR (*)    Leukocytes,Ua TRACE (*)    All other components within normal limits  RESP PANEL BY RT-PCR (FLU A&B, COVID) ARPGX2  BODY FLUID CULTURE W GRAM STAIN  CULTURE, BLOOD (ROUTINE X 2)  CULTURE, BLOOD (ROUTINE X 2)  CHLAMYDIA/NGC RT PCR ([facility] ONLY)            LACTIC ACID, PLASMA  LACTIC ACID, PLASMA  C-REACTIVE PROTEIN  GLUCOSE, BODY FLUID OTHER            PROTEIN, BODY FLUID (OTHER)  HIV ANTIBODY (ROUTINE TESTING W REFLEX)  PROTIME-INR  CORTISOL-AM, BLOOD  PROCALCITONIN  CBC  CREATININE, SERUM  COMPREHENSIVE METABOLIC PANEL  CBC     RADIOLOGY  I personally viewed and  evaluated these images as part of my medical decision making, as well as reviewing the written report by the radiologist.  ED Provider Interpretation:    PROCEDURES:  Critical Care performed: No  .Joint Aspiration/Arthrocentesis  Date/Time: 03/22/2021 3:27 PM Performed by: Lannie Fields, PA-C Authorized by: Lannie Fields, PA-C   Consent:    Consent obtained:  Verbal   Consent given by:  Patient   Risks, benefits, and alternatives were discussed: yes     Risks discussed:  Infection and pain Universal protocol:    Procedure explained and questions answered to patient or proxy's satisfaction: yes     Relevant documents present and verified: yes     Test results available: yes     Imaging studies available: yes     Patient identity confirmed:  Verbally with patient Location:    Location:  Knee   Knee:  R knee Anesthesia:    Anesthesia method:  Local infiltration   Local anesthetic:  Lidocaine 1% w/o epi Procedure details:    Needle gauge:  18 G   Ultrasound guidance: no     Approach:  Lateral   MEDICATIONS ORDERED IN ED: Medications  enoxaparin (LOVENOX) injection 40 mg (has no administration in time range)  lactated ringers bolus 1,000 mL (has no administration in time range)    And  lactated ringers bolus 1,000 mL (has no administration in time range)    And  lactated ringers bolus 500 mL (has no administration in time range)  acetaminophen (TYLENOL) tablet 650 mg (has no administration in time range)    Or  acetaminophen (TYLENOL) suppository 650 mg (has no administration in time range)  ondansetron (ZOFRAN) tablet 4 mg (has no administration in time range)    Or  ondansetron (ZOFRAN) injection 4 mg (has no administration in time range)  lidocaine (XYLOCAINE) 1 % (with pres) injection 5 mL (5 mLs Infiltration Given by Other 03/22/21 1600)  ipratropium-albuterol (DUONEB) 0.5-2.5 (3) MG/3ML nebulizer solution 3 mL (3 mLs Nebulization Given 03/22/21 1829)      IMPRESSION / MDM / [place] / ED COURSE  I reviewed the triage vital signs and the nursing notes.                              Assessment and Plan: Knee pain:  Differential diagnosis includes, but is not limited to, septic knee, sepsis, knee effusion, bacteremia,  gonorrhea...  26 year old male presents to the emergency department with progressively swelling knee pain for the past 2 to 3 days without erythema of the skin overlying the knee.  Patient was tachycardic at triage but vital signs were otherwise reassuring.  On physical exam, patient did have pain and restricted range of motion at the right knee.  He had a positive ballottement loss of peripatellar dumpling.  Given physical exam findings I am concerned about the possibility of septic knee.  We will perform arthrocentesis and obtain gonorrhea and Chlamydia testing as patient does endorse dysuria.  White blood cell count 18.8 which is significantly elevated from labs obtained 3 weeks ago.  Arthrocentesis results were concerning for septic knee with WBC count at 51,792.  I reached out to orthopedist on-call, Dr. Posey Pronto who is also concern for septic knee and is taking patient to the operating room today.  Dr. Posey Pronto requested holding antibiotic until after surgery.  Patient was excepted for admission under the care of Dr. Doristine Bosworth.   FINAL CLINICAL IMPRESSION(S) / ED DIAGNOSES   Final diagnoses:  Pyogenic arthritis of right knee joint, due to unspecified organism [facility])     Rx / DC Orders   ED Discharge Orders     None        Note:  This document was prepared using Dragon voice recognition software and may include unintentional dictation errors.   Vallarie Mare Linda, PA-C 03/22/21 1831    Merlyn Lot, MD 03/22/21 2102

## 2021-03-22 NOTE — Progress Notes (Signed)
Pharmacy Antibiotic Note  Lance Marquez is a 26 y.o. male admitted on 03/22/2021 with septic knee.  Pharmacy has been consulted for antibiotic dosing for septic knee. broad spectrum coverage. has had prior significant reaction with vancomycin.  Plan: Ordered Zyvox 600mg  q12hr per indication and renal fxn.  Will have ID RPh to review and optimize abx regimen in AM prior to 2nd dose.  Height: 5\' 10"  (177.8 cm) Weight: 78 kg (171 lb 15.3 oz) IBW/kg (Calculated) : 73  Temp (24hrs), Avg:97.8 F (36.6 C), Min:97.5 F (36.4 C), Max:98 F (36.7 C)  Recent Labs  Lab 03/22/21 1350 03/22/21 1543  WBC 18.8*  --   CREATININE 0.79  --   LATICACIDVEN 0.8 1.4    Estimated Creatinine Clearance: 145.7 mL/min (by C-G formula based on SCr of 0.79 mg/dL).    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  4/09, PharmD, [facility] 03/22/2021 11:15 PM

## 2021-03-22 NOTE — H&P (Signed)
H&P reviewed. No significant changes noted.  

## 2021-03-22 NOTE — ED Notes (Signed)
Pt notified all metal, jewelry and personal clothing to be removed and for him to change into gown. Pt agreeable and changing now. Pt given two belongings bags.

## 2021-03-22 NOTE — Consult Note (Signed)
ORTHOPAEDIC CONSULTATION  REQUESTING PHYSICIAN: Darliss Cheney, MD  Chief Complaint:   R knee pain  History of Present Illness: Lance Marquez is a 26 y.o. male w 3 day hx of R knee pain.  He presents to the emergency department today after he had severe pain with any flexion or extension of the right knee.  Of note, he has a history of IV drug use with use of heroin approximately 3 days ago.  He also uses meth and last usage was late last night.  Of note, he was treated with p.o. clindamycin for left foot cellulitis approximately 3 weeks ago.  Patient had an elevated white blood count of 18.8. The right knee joint was aspirated in the emergency department.  Synovial fluid analysis showed white blood cell count of 51,000 with 94% PMNs and no crystals. ESR was also elevated at 59.  CRP pending.  He states that his symptoms are somewhat improved after fluid aspiration.  Past Medical History:  Diagnosis Date   Asthma    C. difficile diarrhea    Diarrhea    Drug abuse and dependence ([place])    Parasites in stool    Suicidal behavior    History reviewed. No pertinent surgical history. Social History   Socioeconomic History   Marital status: Single    Spouse name: Not on file   Number of children: Not on file   Years of education: Not on file   Highest education level: Not on file  Occupational History   Not on file  Tobacco Use   Smoking status: Every Day    Types: Cigarettes   Smokeless tobacco: Never  Substance and Sexual Activity   Alcohol use: Yes    Comment: occ   Drug use: Yes    Types: IV, Methamphetamines    Comment: heroin, meth   Sexual activity: Never  Other Topics Concern   Not on file  Social History Narrative   Not on file   Social Determinants of Health   Financial Resource Strain: Not on file  Food Insecurity: Not on file  Transportation Needs: Not on file  Physical Activity: Not on file   Stress: Not on file  Social Connections: Not on file   No family history on file. 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 Authorizing Provider  albuterol (PROVENTIL) (2.5 MG/3ML) 0.083% nebulizer solution Inhale 2.5 mg into the lungs every 6 (six) hours as needed. Patient not taking: Reported on 03/22/2021    [provider]  naproxen (NAPROSYN) 500 MG tablet Take 1 tablet (500 mg total) by mouth 2 (two) times daily with a meal. Patient not taking: Reported on 03/22/2021 04/17/20   Sable Feil, PA-C  ondansetron (ZOFRAN) 8 MG tablet Take 1 tablet (8 mg total) by mouth every 8 (eight) hours as needed for nausea or vomiting. Patient not taking: Reported on 03/22/2021 04/17/20   Sable Feil, PA-C  orphenadrine (NORFLEX) 100 MG tablet Take 1 tablet (100 mg total) by mouth 2 (two) times daily. Patient not taking: Reported on 03/22/2021 04/17/20   Sable Feil, PA-C   Recent Labs    03/22/21 1350  WBC 18.8*  HGB 12.5*  HCT 36.9*  PLT 263  K 4.5  CL 102  CO2 24  BUN 15  CREATININE 0.79  GLUCOSE 112*  CALCIUM 9.2  Component     Latest Ref Rng & Units 03/22/2021  Color, Synovial     YELLOW YELLOW (A)  Appearance-Synovial     CLEAR CLOUDY (A)  Crystals, Fluid      NO CRYSTALS SEEN  WBC, Synovial     0 - 200 /cu mm 51,792 (H)  Neutrophil, Synovial     % 94  Lymphocytes-Synovial Fld     % 4  Monocyte-Macrophage-Synovial Fluid     % 2  Eosinophils-Synovial     % 0  Sed Rate     0 - 15 mm/hr 59 (H)    DG Knee Complete 4 Views Right  Result Date: 03/22/2021 CLINICAL DATA:  Septic knee. EXAM: RIGHT KNEE - COMPLETE 4+ VIEW COMPARISON:  None. FINDINGS: No evidence of fracture, dislocation, or joint effusion. No evidence of arthropathy or other focal bone abnormality. Soft tissues are unremarkable. IMPRESSION: Negative. Electronically Signed    By: Marijo Conception M.D.   On: 03/22/2021 16:11     Positive ROS: All other systems have been reviewed and were otherwise negative with the exception of those mentioned in the HPI and as above.  Physical Exam: BP 121/70    Pulse 83    Temp 98 F (36.7 C)    Resp 18    Ht 5' 9"  (1.753 m)    Wt 78 kg    SpO2 99%    BMI 25.39 kg/m  General:  Alert, no acute distress Psychiatric:  Patient is competent for consent with normal mood and affect   Cardiovascular:  No pedal edema, regular rate and rhythm Respiratory:  No wheezing, non-labored breathing GI:  Abdomen is soft and non-tender Skin:  No lesions in the area of chief complaint, no erythema Neurologic:  Sensation intact distally, CN grossly intact Lymphatic:  No axillary or cervical lymphadenopathy  Orthopedic Exam:  RLE: 5/5 DF/PF/EHL SILT s/s/t/sp/dp distr Foot wwp RoM 0-110 with pain at end ranges.  No severe erythema about the knee.  No large joint effusion   X-rays:  As above: No fractures or dislocations.  No significant degenerative changes.  Assessment/Plan: 26 year old male with history of IV drug use with concern for septic arthritis of the right knee given presenting symptoms and synovial fluid analysis. 1.  Discussed the diagnosis as well as the various treatment options.  Given concern for septic arthritis, emergent arthroscopic irrigation and debridement of the right knee was recommended. After discussion of risks, benefits, and alternatives to surgery, the patient elected to proceed.    2.  N.p.o. until OR.  3.  Admit to hospitalist team.     Lance Marquez   03/22/2021 6:38 PM

## 2021-03-22 NOTE — ED Notes (Signed)
Report given to OR.

## 2021-03-22 NOTE — ED Notes (Signed)
Pt up to restroom with urine specimen cup in hand; steady. Reports history of swelling to L foot then R foot and then R knee.

## 2021-03-22 NOTE — Op Note (Signed)
Operative Note    SURGERY DATE: 03/22/2021   PRE-OP DIAGNOSIS:  1. Left knee septic arthritis   POST-OP DIAGNOSIS:  1. Left knee inflammatory process   PROCEDURES:  1. Left knee arthroscopic irrigation and debridement/lavage and drainage   SURGEON: Cato Mulligan, MD   ANESTHESIA: Gen   ESTIMATED BLOOD LOSS: minimal   TOTAL IV FLUIDS: per anesthesia   INDICATION(S):  Lance Marquez is a 26 y.o. male with 3-day history of worsening knee pain and 1 day history of severe difficulty with knee flexion and extension with large effusion. He presented to the ED, where joint aspiration showed >50000 WBCs with 94% PMNs and negative crystals.  Additionally, white blood count and ESR were elevated.  Patient also has a history of IV drug use.  Clinical exam prior to aspiration was concerning for septic arthritis.  Mild exam after joint aspiration was less concerning, but given lab work and initial presentation with history of drug use, we decided to proceed with irrigation debridement of the knee.  After discussion of risks, benefits, and alternatives to surgery, the patient elected to proceed.    OPERATIVE FINDINGS:    Examination under anesthesia: A careful examination under anesthesia was performed.  Passive range of motion was: Hyperextension: 2.  Extension: 0.  Flexion: 130.  Intra-operative findings: A thorough arthroscopic examination of the knee was performed.  The findings are: 1. Suprapatellar pouch: Normal 2. Undersurface of median ridge: Normal 3. Medial patellar facet: Normal 4. Lateral patellar facet: Normal 5. Trochlea: Normal 6. Lateral gutter/popliteus tendon: Normal 7. Hoffa's fat pad: Normal 8. Medial gutter/plica: Normal 9. ACL: Normal 10. PCL: Normal 11. Medial meniscus: normal 12. Medial compartment cartilage: Normal 13. Lateral meniscus: Normal 14. Lateral compartment cartilage: Grade 1 fissuring of the tibial plateau, normal lateral femoral condyle    OPERATIVE REPORT:     I identified Lance Marquez in the pre-operative holding area. I marked the operative knee with my initials. I reviewed the risks and benefits of the proposed surgical intervention and the patient wished to proceed. The patient was transferred to the operative suite and placed in the supine position with all bony prominences padded.  Anesthesia was administered. IV antibiotics were held until after cultures were obtained. The extremity was then prepped and draped in standard fashion. A time out was performed confirming the correct extremity, correct patient, and correct procedure.   Arthroscopy portals were marked. The anterolateral portal was established with an 11 blade. The arthroscope was placed in the anterolateral portal and then into the suprapatellar pouch.  Culture sample was taken of the fluid egress through the arthroscope sheath once the arthroscope trocar was removed.  This fluid itself was clear, yellowish synovial fluid. Broad spectrum IV antibiotics were administered at this point.    Next, the medial portal was established under needle localization. A shaver was introduced to help move fluid through the joint and improve visualization.  Hoffa's fat pad was debrided.  A diagnostic arthroscopy was performed with findings as indicated above. Arthroscopic lavage was performed in all compartments. The shaver was used to debride the synovium in all compartments of the knee. A radiofrequency device was used to cauterize bleeding vessels. A Hemovac drain was then placed through the anteromedial portal. Arthroscopic fluid was removed from the joint.     The portals were closed with 3-0 Nylon suture. Local anesthetic was injected about the portal sites and into the knee joint. Sterile dressings included Xeroform, 4x4s, Sof-Rol, and Bias  wrap. The patient was then awakened and taken to the PACU without complication.     POSTOPERATIVE PLAN: The patient will be continued  on IV antibiotics. Follow up OR culture and ED aspirate culture results.  DVT prophylaxis per primary medical team.  Weight-bearing as tolerated.  PT/OT for mobilization.

## 2021-03-22 NOTE — Anesthesia Preprocedure Evaluation (Signed)
Anesthesia Evaluation  Patient identified by MRN, date of birth, ID band Patient awake    Reviewed: Allergy & Precautions, NPO status , Patient's Chart, lab work & pertinent test results  History of Anesthesia Complications Negative for: history of anesthetic complications  Airway Mallampati: II  TM Distance: >3 FB Neck ROM: Full    Dental  (+) Poor Dentition, Chipped, Missing   Pulmonary asthma , neg sleep apnea, neg COPD, Current SmokerPatient did not abstain from smoking.,  Does not use inhalers; not hospitalized in many years for asthma.   Pulmonary exam normal breath sounds clear to auscultation       Cardiovascular Exercise Tolerance: Good METS(-) hypertension(-) CAD and (-) Past MI negative cardio ROS  (-) dysrhythmias  Rhythm:Regular Rate:Normal - Systolic murmurs    Neuro/Psych negative neurological ROS  negative psych ROS   GI/Hepatic neg GERD  ,(+)     substance abuse  marijuana use, methamphetamine use and IV drug use, Last used crystal meth earlier today. Does not appear actively intoxicated and is able to fully hold a conversation   Endo/Other  neg diabetes  Renal/GU negative Renal ROS     Musculoskeletal   Abdominal   Peds  Hematology   Anesthesia Other Findings Past Medical History: No date: Asthma No date: C. difficile diarrhea No date: Diarrhea No date: Drug abuse and dependence (HCC) No date: Parasites in stool No date: Suicidal behavior  Reproductive/Obstetrics                             Anesthesia Physical Anesthesia Plan  ASA: 3 and emergent  Anesthesia Plan: General   Post-op Pain Management: Ofirmev IV (intra-op) and Toradol IV (intra-op)   Induction: Intravenous  PONV Risk Score and Plan: 2 and Ondansetron, Dexamethasone and Midazolam  Airway Management Planned: Oral ETT  Additional Equipment: None  Intra-op Plan:   Post-operative Plan:  Extubation in OR  Informed Consent: I have reviewed the patients History and Physical, chart, labs and discussed the procedure including the risks, benefits and alternatives for the proposed anesthesia with the patient or authorized representative who has indicated his/her understanding and acceptance.     Dental advisory given  Plan Discussed with: CRNA and Surgeon  Anesthesia Plan Comments: (Discussed risks of anesthesia with patient, including PONV, sore throat, lip/dental/eye damage. Rare risks discussed as well, such as cardiorespiratory and neurological sequelae, and allergic reactions. Discussed the role of CRNA in patient's perioperative care. Patient understands. Patient counseled on being higher risk for anesthesia due to comorbidities: active meth use. Patient was told about increased risk of cardiac and respiratory events, including death. I spoke with surgeon Dr patel who deemed this case emergent, unable to wait for meth clearance from body. )        Anesthesia Quick Evaluation

## 2021-03-22 NOTE — ED Notes (Signed)
Fluid and further blood-work dropped off to lab by this RN.

## 2021-03-23 ENCOUNTER — Encounter: Payer: Self-pay | Admitting: Orthopedic Surgery

## 2021-03-23 DIAGNOSIS — F1990 Other psychoactive substance use, unspecified, uncomplicated: Secondary | ICD-10-CM

## 2021-03-23 DIAGNOSIS — D72829 Elevated white blood cell count, unspecified: Secondary | ICD-10-CM

## 2021-03-23 DIAGNOSIS — E871 Hypo-osmolality and hyponatremia: Secondary | ICD-10-CM | POA: Diagnosis present

## 2021-03-23 LAB — GLUCOSE, BODY FLUID OTHER: Glucose, Body Fluid Other: 71 mg/dL

## 2021-03-23 LAB — COMPREHENSIVE METABOLIC PANEL
ALT: 12 U/L (ref 0–44)
AST: 15 U/L (ref 15–41)
Albumin: 3 g/dL — ABNORMAL LOW (ref 3.5–5.0)
Alkaline Phosphatase: 88 U/L (ref 38–126)
Anion gap: 6 (ref 5–15)
BUN: 15 mg/dL (ref 6–20)
CO2: 23 mmol/L (ref 22–32)
Calcium: 8.6 mg/dL — ABNORMAL LOW (ref 8.9–10.3)
Chloride: 105 mmol/L (ref 98–111)
Creatinine, Ser: 0.76 mg/dL (ref 0.61–1.24)
GFR, Estimated: >60 mL/min (ref >60)
Glucose, Bld: 153 mg/dL — ABNORMAL HIGH (ref 70–99)
Potassium: 4.6 mmol/L (ref 3.5–5.1)
Sodium: 134 mmol/L — ABNORMAL LOW (ref 135–145)
Total Bilirubin: 0.4 mg/dL (ref 0.3–1.2)
Total Protein: 7.5 g/dL (ref 6.5–8.1)

## 2021-03-23 LAB — CBC
HCT: 33.4 % — ABNORMAL LOW (ref 39.0–52.0)
Hemoglobin: 11.3 g/dL — ABNORMAL LOW (ref 13.0–17.0)
MCH: 29.3 pg (ref 26.0–34.0)
MCHC: 33.8 g/dL (ref 30.0–36.0)
MCV: 86.5 fL (ref 80.0–100.0)
Platelets: 321 10*3/uL (ref 150–400)
RBC: 3.86 MIL/uL — ABNORMAL LOW (ref 4.22–5.81)
RDW: 14.6 % (ref 11.5–15.5)
WBC: 11.8 10*3/uL — ABNORMAL HIGH (ref 4.0–10.5)
nRBC: 0 % (ref 0.0–0.2)

## 2021-03-23 LAB — PROTIME-INR
INR: 1 (ref 0.8–1.2)
Prothrombin Time: 13.5 seconds (ref 11.4–15.2)

## 2021-03-23 LAB — C-REACTIVE PROTEIN: CRP: 5.8 mg/dL — ABNORMAL HIGH (ref <1.0)

## 2021-03-23 LAB — PROTEIN, BODY FLUID (OTHER): Total Protein, Body Fluid Other: 4.3 g/dL

## 2021-03-23 LAB — CORTISOL-AM, BLOOD: Cortisol - AM: 3 ug/dL — ABNORMAL LOW (ref 6.7–22.6)

## 2021-03-23 LAB — CHLAMYDIA/NGC RT PCR (ARMC ONLY)
Chlamydia Tr: NOT DETECTED
N gonorrhoeae: NOT DETECTED

## 2021-03-23 LAB — PROCALCITONIN: Procalcitonin: 0.48 ng/mL

## 2021-03-23 MED ORDER — TRAZODONE HCL 50 MG PO TABS: 50.0000 mg | ORAL_TABLET | Freq: Every day | ORAL | Status: DC

## 2021-03-23 MED ORDER — SODIUM CHLORIDE 0.9 % IV SOLN
2.0000 g | Freq: Three times a day (TID) | INTRAVENOUS | Status: DC
  Administered 2021-03-23 – 2021-03-24 (×3): 2 g via INTRAVENOUS
  Filled 2021-03-23 (×6): qty 2

## 2021-03-23 MED ORDER — ENOXAPARIN SODIUM 40 MG/0.4ML IJ SOSY: 40.0000 mg | PREFILLED_SYRINGE | INTRAMUSCULAR | 0 refills | Status: DC

## 2021-03-23 MED ORDER — TRAZODONE HCL 50 MG PO TABS
50.0000 mg | ORAL_TABLET | Freq: Once | ORAL | Status: AC
  Administered 2021-03-23: 50 mg via ORAL
  Filled 2021-03-23: qty 1

## 2021-03-23 MED ORDER — OXYCODONE HCL 5 MG PO TABS: 5.0000 mg | ORAL_TABLET | ORAL | 0 refills | Status: DC | PRN

## 2021-03-23 NOTE — Progress Notes (Signed)
OT Screen Note  Patient Details Name: Lance Marquez MRN: [id number] DOB: 1995-04-05   Cancelled Treatment:    Reason Eval/Treat Not Completed: OT screened, no needs identified, will sign off. Consult received, chart reviewed. Spoke with PT who saw pt. Per PT, pt is independent, no skilled OT needs at this time. Will sign off.   Arman Filter., MPH, MS, OTR/L ascom [phone] 03/23/21, 9:07 AM

## 2021-03-23 NOTE — Progress Notes (Signed)
Met with the patient to discuss DC plan and needs He lives at home with his dad and Rozanna Boer dad He was provided the [facility] application He was provided Drug Abuse resources He will obtain non narcotic medication at [facility] He has transportation with his dad and grand dad

## 2021-03-23 NOTE — Evaluation (Signed)
Physical Therapy Evaluation Patient Details Name: Lance Marquez MRN: [id number] DOB: 09/27/1995 Today's Date: 03/23/2021  History of Present Illness  Pt is a 26 y.o. male with medical history significant of IVDU presented to ED with a complaint of right knee pain.  Pt diagnosed with left knee septic arthritis and is s/p arthroscopic irrigation and debridement/lavage and drainage.   Clinical Impression  Pt was pleasant and motivated to participate during the session and put forth good effort throughout. Pt Ind/mod Ind with all functional tasks demonstrating good eccentric and concentric control and stability throughout.  Pt reported only mild 2/10 R knee pain at rest that did not worsen with WB activities.  No adverse symptoms noted during the session other than R knee pain with SpO2 and HR WNL.  No skilled PT needs identified.  Will complete PT orders at this time but will reassess pt pending a change in status upon receipt of new PT orders.         Recommendations for follow up therapy are one component of a multi-disciplinary discharge planning process, led by the attending physician.  Recommendations may be updated based on patient status, additional functional criteria and insurance authorization.  Follow Up Recommendations No PT follow up    Assistance Recommended at Discharge None  Patient can return home with the following  Assist for transportation    Equipment Recommendations None recommended by PT  Recommendations for Other Services       Functional Status Assessment Patient has not had a recent decline in their functional status     Precautions / Restrictions Precautions Precautions: None Restrictions Weight Bearing Restrictions: Yes RLE Weight Bearing: Weight bearing as tolerated      Mobility  Bed Mobility Overal bed mobility: Independent                  Transfers Overall transfer level: Independent Equipment used: None                General transfer comment: Good control and stability    Ambulation/Gait Ambulation/Gait assistance: Independent Gait Distance (Feet): 200 Feet Assistive device: None Gait Pattern/deviations: WFL(Within Functional Limits)       General Gait Details: No R knee buckling or increase in pain with WB/ambulation; no adverse symptoms with pt steady throughout  Stairs Stairs: Yes Stairs assistance: Modified independent (Device/Increase time) Stair Management: One rail Right, Step to pattern, Forwards Number of Stairs: 4 General stair comments: Pt steady ascending and descending 4 steps x 2 with one rail; min verbal and visual cues for proper sequencing  Wheelchair Mobility    Modified Rankin (Stroke Patients Only)       Balance Overall balance assessment: No apparent balance deficits (not formally assessed)                                           Pertinent Vitals/Pain Pain Assessment Pain Assessment: 0-10 Pain Score: 2  Pain Location: R knee Pain Descriptors / Indicators: Sore Pain Intervention(s): Premedicated before session, Monitored during session    Home Living Family/patient expects to be discharged to:: Private residence Living Arrangements: Parent;Other relatives Available Help at Discharge: Family;Available 24 hours/day Type of Home: Mobile home Home Access: Stairs to enter Entrance Stairs-Rails: Right;Left Entrance Stairs-Number of Steps: 2   Home Layout: One level Home Equipment: Cane - single point      Prior Function  Prior Level of Function : Independent/Modified Independent             Mobility Comments: Ind amb community distances without an AD, no fall history ADLs Comments: Ind with ADLs     Hand Dominance        Extremity/Trunk Assessment   Upper Extremity Assessment Upper Extremity Assessment: Overall WFL for tasks assessed    Lower Extremity Assessment Lower Extremity Assessment: Overall WFL for tasks  assessed       Communication   Communication: No difficulties  Cognition Arousal/Alertness: Awake/alert Behavior During Therapy: WFL for tasks assessed/performed Overall Cognitive Status: Within Functional Limits for tasks assessed                                          General Comments      Exercises     Assessment/Plan    PT Assessment Patient does not need any further PT services  PT Problem List         PT Treatment Interventions      PT Goals (Current goals can be found in the Care Plan section)  Acute Rehab PT Goals PT Goal Formulation: All assessment and education complete, DC therapy    Frequency       Co-evaluation               AM-PAC PT "6 Clicks" Mobility  Outcome Measure Help needed turning from your back to your side while in a flat bed without using bedrails?: None Help needed moving from lying on your back to sitting on the side of a flat bed without using bedrails?: None Help needed moving to and from a bed to a chair (including a wheelchair)?: None Help needed standing up from a chair using your arms (e.g., wheelchair or bedside chair)?: None Help needed to walk in hospital room?: None Help needed climbing 3-5 steps with a railing? : None 6 Click Score: 24    End of Session Equipment Utilized During Treatment: Gait belt Activity Tolerance: Patient tolerated treatment well Patient left: in chair;with call bell/phone within reach;with chair alarm set;Other (comment) (chair alarm set per nsg request) Nurse Communication: Mobility status PT Visit Diagnosis: Difficulty in walking, not elsewhere classified (R26.2);Pain Pain - Right/Left: Right Pain - part of body: Knee    Time: 1610-9604 PT Time Calculation (min) (ACUTE ONLY): 32 min   Charges:   PT Evaluation $PT Eval Low Complexity: 1 Low         D. Scott Hatice Bubel PT, DPT 03/23/21, 11:06 AM

## 2021-03-23 NOTE — Progress Notes (Signed)
Nutrition Brief Note  Patient identified on the Malnutrition Screening Tool (MST) Report  Wt Readings from Last 15 Encounters:  03/22/21 78 kg  03/01/21 77.1 kg  07/24/20 59 kg  04/17/20 59 kg  08/31/19 62.6 kg  02/28/19 59 kg  08/24/18 54.4 kg  07/13/15 79.4 kg (78 %, Z= 0.76)*  03/15/15 79.4 kg (79 %, Z= 0.80)*  05/21/12 74.8 kg (85 %, Z= 1.03)*   * Growth percentiles are based on CDC (Boys, 2-20 Years) data.   26 year old male with history of intravenous drug use, lower extremity cellulitis almost a month ago treated with oral clindamycin as an outpatient presented with right knee pain.  On presentation, he had leukocytosis.  Right knee arthrocentesis showed synovial fluid with WBCs of 51,000.  He was started on broad-spectrum antibiotics.  He underwent left knee I&D by orthopedics on 03/22/2021.  Pt admitted with septic arthritis of knee.   2/9- s/p PROCEDURES:  1. Left knee arthroscopic irrigation and debridement/lavage and drainage  Reviewed I/O's: +1.6 L x 24 hours  Drain output: 61 ml x 24 hours  Labs reviewed: Na: 134.   Current diet order is regular, patient is consuming approximately 100% of meals at this time. Labs and medications reviewed.   No nutrition interventions warranted at this time. If nutrition issues arise, please consult RD.   Loistine Chance, RD, LDN, [place] Registered Dietitian II Certified Diabetes Care and Education Specialist Please refer to [facility] for RD and/or RD on-call/weekend/after hours pager

## 2021-03-23 NOTE — Discharge Instructions (Signed)
Arthroscopic Knee Surgery   Post-Op Instructions   1. Bracing or crutches: Crutches will be provided at the time of discharge from the surgery center.    2. Ice: You may be provided with a device [facility]) that allows you to ice the affected area effectively. Otherwise you can ice manually.    3. Driving:  Plan on not driving for at least three days, and potentially longer if you are restricted by swelling. Please note that you are advised NOT to drive while taking narcotic pain medications as you may be impaired and unsafe to drive.   4. Activity: Ankle pumps several times an hour while awake to prevent blood clots. Weight bearing: full weight is permitted. Use crutches if there is pain and limping. Bending and straightening the knee is unlimited. Elevate knee above heart level as much as possible for one week. Avoid standing more than 5 minutes (consecutively) for the first week. No exercise involving the knee until cleared by the surgeon or physical therapist.  Avoid long distance travel for 4 weeks.   5. Medications:  - Continue the antibiotics that were prescribed on discharge - You have been provided a prescription for narcotic pain medicine. After surgery, take 1-2 narcotic tablets every 4 hours if needed for severe pain.  - A prescription for anti-nausea medication will be provided in case the narcotic medicine causes nausea - take 1 tablet every 6 hours only if nauseated.  - Take ibuprofen 600 mg every 6 hours with food to reduce post-operative knee swelling. DO NOT STOP IBUPROFEN POST-OP UNTIL INSTRUCTED TO DO SO at first post-op office visit (10-14 days after surgery).  -Use the Lovenox injections for 2 weeks.  After you finish the Lovenox take enteric coated aspirin 325 mg once daily for 2 more weeks to prevent blood clots.  -Take tylenol 500 mg every 6 hours for pain.  May stop tylenol 3 days after surgery if you are having minimal pain.   If you are taking prescription medication  for anxiety, depression, insomnia, muscle spasm, chronic pain, or for attention deficit disorder you are advised that you are at a higher risk of adverse effects with use of narcotics post-op, including narcotic addiction/dependence, depressed breathing, death. If you use non-prescribed substances: alcohol, marijuana, cocaine, heroin, methamphetamines, etc., you are at a higher risk of adverse effects with use of narcotics post-op, including narcotic addiction/dependence, depressed breathing, death. You are advised that taking > 50 morphine milligram equivalents (MME) of narcotic pain medication per day results in twice the risk of overdose or death. For your prescription provided: oxycodone 5 mg - taking more than 6 tablets per day. Be advised that we will prescribe narcotics short-term, for acute post-operative pain only - 1 week for minor operations such as knee arthroscopy for meniscus tear resection, and 3 weeks for major operations such as knee repair/reconstruction surgeries.   6. Bandages: The physical therapist should change the bandages at the first post-op appointment. If needed, the dressing supplies have been provided to you.   7. Physical Therapy: 2 times per week for the first 4 weeks, then 1-2 times per week from weeks 4-8 post-op. Therapy typically starts on post operative Day 3 or 4. You have been provided an order for physical therapy. The therapist will provide home exercises.   8. Work: May return when able to tolerate standing for greater than 2 hours.   9. Post-Op Appointments: Your first post-op appointment will be with Dr. Allena Katz in approximately 2  weeks time.    If you find that they have not been scheduled please call the Orthopaedic Appointment front desk at [phone].

## 2021-03-23 NOTE — Progress Notes (Addendum)
°  Progress Note   Patient: Lance Marquez HKV:[id number] DOB: 11-04-1995 DOA: 03/22/2021     1 DOS: the patient was seen and examined on 03/23/2021   Brief hospital course: 26 year old male with history of intravenous drug use, lower extremity cellulitis almost a month ago treated with oral clindamycin as an outpatient presented with right knee pain.  On presentation, he had leukocytosis.  Right knee arthrocentesis showed synovial fluid with WBCs of 51,000.  He was started on broad-spectrum antibiotics.  He underwent left knee I&D by orthopedics on 03/22/2021.  Assessment and Plan: * Septic arthritis of knee (HCC)- (present on admission) Sepsis: Present on admission -Patient presented with fever, tachycardia, leukocytosis with possible right knee septic arthritis -Right knee arthrocentesis in the ED revealed synovial fluid with 51,000 WBCs. -Status post I&D by orthopedics on 03/22/2021: Findings possibly from some kind of inflammatory arthritis rather than septic arthritis as per orthopedics.  We will continue broad-spectrum antibiotics for today and await culture results.   Leukocytosis- (present on admission) - Improving.  Monitor  IVDU (intravenous drug user)- (present on admission) - Counseled regarding cessation.  Social worker consult.  Hyponatremia- (present on admission) - Mild.  Encourage oral intake.  Monitor.    Subjective:  Patient seen and examined at bedside.  Feels that his knee pain is improving.  Denies any fever, nausea, vomiting. Physical Exam: Vitals:   03/22/21 2130 03/22/21 2155 03/22/21 2204 03/23/21 0841  BP: (!) 141/83 121/75 121/75 123/70  Pulse: 92 80 80 90  Resp: 20 15 15 16   Temp: (!) 97.5 F (36.4 C) 97.8 F (36.6 C) 97.8 F (36.6 C) 98 F (36.7 C)  TempSrc:   Oral   SpO2: 100% 100%  98%  Weight:   78 kg   Height:   5\' 10"  (1.778 m)    General: No acute distress, currently on room air respiratory: Bilateral decreased breath sounds at bases  with no wheezing  CVS: S1-S2 heard, rate controlled Abdominal: Soft, nontender, nondistended, no organomegaly, bowel sounds heard Extremities: No cyanosis, clubbing, edema CNS: Alert, awake and oriented.  No focal neurologic deficit.  Moving extremities. Lymph: No cervical lymphadenopathy Skin: No rashes, lesions, ulcers Psych: Affect is mostly flat.  No signs of agitation. Musculoskeletal: Right knee dressing present.  Data Reviewed: I have reviewed WBC of 11.8, sodium of 134 with procalcitonin of 0.48 from today.  Cultures are negative so far.  Family Communication: None at bedside  Disposition: Status is: Inpatient Remains inpatient appropriate because: Of need for IV antibiotics    Planned Discharge Destination: Home in the next 24 to 48 hours if cultures remain negative and if cleared by orthopedics      Author: , MD 03/23/2021 10:13 AM  For on call review [email or web address].

## 2021-03-23 NOTE — Assessment & Plan Note (Signed)
Sepsis: Present on admission -Patient presented with fever, tachycardia, leukocytosis with possible right knee septic arthritis -Right knee arthrocentesis in the ED revealed synovial fluid with 51,000 WBCs. -Status post I&D by orthopedics on 03/22/2021: Findings possibly from some kind of inflammatory arthritis rather than septic arthritis as per orthopedics.  We will continue broad-spectrum antibiotics for today and await culture results.

## 2021-03-23 NOTE — Assessment & Plan Note (Signed)
-   Counseled regarding cessation.  Social worker consult.

## 2021-03-23 NOTE — Assessment & Plan Note (Signed)
Improving. Monitor. °

## 2021-03-23 NOTE — Assessment & Plan Note (Signed)
-   Mild.  Encourage oral intake.  Monitor.

## 2021-03-23 NOTE — Hospital Course (Signed)
26 year old male with history of intravenous drug use, lower extremity cellulitis almost a month ago treated with oral clindamycin as an outpatient presented with right knee pain.  On presentation, he had leukocytosis.  Right knee arthrocentesis showed synovial fluid with WBCs of 51,000.  He was started on broad-spectrum antibiotics.  He underwent left knee I&D by orthopedics on 03/22/2021.

## 2021-03-23 NOTE — Progress Notes (Signed)
Cross Cover Patient reported having difficulty sleeping and making him anxious.  Dose of trazodone ordered

## 2021-03-23 NOTE — Progress Notes (Signed)
°  Subjective: 1 Day Post-Op Procedure(s) (LRB): ARTHROSCOPY KNEE (Right) Patient reports pain as mild.   Patient is well, and has had no acute complaints or problems.  Slept well.  No significant pain.  Mild pressure. Plan is to go Home after hospital stay. Negative for chest pain and shortness of breath Fever: no Gastrointestinal: Negative for nausea and vomiting  Objective: Vital signs in last 24 hours: Temp:  [97.5 F (36.4 C)-98 F (36.7 C)] 97.8 F (36.6 C) (02/09 2204) Pulse Rate:  [80-105] 80 (02/09 2204) Resp:  [14-20] 15 (02/09 2204) BP: (101-148)/(61-86) 121/75 (02/09 2204) SpO2:  [97 %-100 %] 100 % (02/09 2155) Weight:  [78 kg] 78 kg (02/09 2204)  Intake/Output from previous day:  Intake/Output Summary (Last 24 hours) at 03/23/2021 0717 Last data filed at 03/23/2021 0517 Gross per 24 hour  Intake 1665.62 ml  Output 61 ml  Net 1604.62 ml    Intake/Output this shift: No intake/output data recorded.  Labs: Recent Labs    03/22/21 1350 03/23/21 0524  HGB 12.5* 11.3*   Recent Labs    03/22/21 1350 03/23/21 0524  WBC 18.8* 11.8*  RBC 4.22 3.86*  HCT 36.9* 33.4*  PLT 263 321   Recent Labs    03/22/21 1350 03/23/21 0524  NA 133* 134*  K 4.5 4.6  CL 102 105  CO2 24 23  BUN 15 15  CREATININE 0.79 0.76  GLUCOSE 112* 153*  CALCIUM 9.2 8.6*   Recent Labs    03/23/21 0524  INR 1.0     EXAM General - Patient is Alert and Oriented Extremity - Neurovascular intact Sensation intact distally Intact pulses distally Compartment soft Positive effusion.   Dressing/Incision - clean, dry with the Hemovac drain removed with no complication. Motor Function - intact, moving foot and toes well on exam.   Past Medical History:  Diagnosis Date   Asthma    C. difficile diarrhea    Diarrhea    Drug abuse and dependence (HCC)    Parasites in stool    Suicidal behavior     Assessment/Plan: 1 Day Post-Op Procedure(s) (LRB): ARTHROSCOPY KNEE  (Right) Principal Problem:   Septic arthritis of knee (HCC)  Estimated body mass index is 24.67 kg/m as calculated from the following:   Height as of this encounter: 5\' 10"  (1.778 m).   Weight as of this encounter: 78 kg. Regular diet. Up with physical therapy. Continue IV antibiotics.  Awaiting cultures.  DVT Prophylaxis - Lovenox, Foot Pumps, and TED hose Weight-Bearing as tolerated to right leg  , PA-C Orthopaedic Surgery 03/23/2021, 7:17 AM

## 2021-03-24 LAB — CBC WITH DIFFERENTIAL/PLATELET
Abs Immature Granulocytes: 0.07 10*3/uL (ref 0.00–0.07)
Basophils Absolute: 0.1 10*3/uL (ref 0.0–0.1)
Basophils Relative: 0 %
Eosinophils Absolute: 0.1 10*3/uL (ref 0.0–0.5)
Eosinophils Relative: 1 %
HCT: 31 % — ABNORMAL LOW (ref 39.0–52.0)
Hemoglobin: 10.5 g/dL — ABNORMAL LOW (ref 13.0–17.0)
Immature Granulocytes: 1 %
Lymphocytes Relative: 36 %
Lymphs Abs: 4 10*3/uL (ref 0.7–4.0)
MCH: 29.2 pg (ref 26.0–34.0)
MCHC: 33.9 g/dL (ref 30.0–36.0)
MCV: 86.1 fL (ref 80.0–100.0)
Monocytes Absolute: 0.6 10*3/uL (ref 0.1–1.0)
Monocytes Relative: 5 %
Neutro Abs: 6.4 10*3/uL (ref 1.7–7.7)
Neutrophils Relative %: 57 %
Platelets: 382 10*3/uL (ref 150–400)
RBC: 3.6 MIL/uL — ABNORMAL LOW (ref 4.22–5.81)
RDW: 14.6 % (ref 11.5–15.5)
WBC: 11.1 10*3/uL — ABNORMAL HIGH (ref 4.0–10.5)
nRBC: 0 % (ref 0.0–0.2)

## 2021-03-24 LAB — BASIC METABOLIC PANEL
Anion gap: 6 (ref 5–15)
BUN: 12 mg/dL (ref 6–20)
CO2: 23 mmol/L (ref 22–32)
Calcium: 8.4 mg/dL — ABNORMAL LOW (ref 8.9–10.3)
Chloride: 107 mmol/L (ref 98–111)
Creatinine, Ser: 0.75 mg/dL (ref 0.61–1.24)
GFR, Estimated: >60 mL/min (ref >60)
Glucose, Bld: 108 mg/dL — ABNORMAL HIGH (ref 70–99)
Potassium: 4 mmol/L (ref 3.5–5.1)
Sodium: 136 mmol/L (ref 135–145)

## 2021-03-24 LAB — MAGNESIUM: Magnesium: 2.1 mg/dL (ref 1.7–2.4)

## 2021-03-24 LAB — C-REACTIVE PROTEIN: CRP: 2.3 mg/dL — ABNORMAL HIGH (ref <1.0)

## 2021-03-24 MED ORDER — METHOCARBAMOL 500 MG PO TABS: 500.0000 mg | ORAL_TABLET | Freq: Four times a day (QID) | ORAL | 0 refills | Status: DC | PRN

## 2021-03-24 MED ORDER — ASPIRIN EC 325 MG PO TBEC: 325.0000 mg | DELAYED_RELEASE_TABLET | Freq: Every day | ORAL | 0 refills | Status: AC

## 2021-03-24 MED ORDER — NAPROXEN 500 MG PO TABS: 500.0000 mg | ORAL_TABLET | Freq: Two times a day (BID) | ORAL | Status: DC

## 2021-03-24 MED ORDER — SENNOSIDES-DOCUSATE SODIUM 8.6-50 MG PO TABS: 1.0000 | ORAL_TABLET | Freq: Every evening | ORAL | 0 refills | Status: DC | PRN

## 2021-03-24 NOTE — Progress Notes (Signed)
°  Subjective: 2 Days Post-Op Procedure(s) (LRB): ARTHROSCOPY KNEE (Right) Patient reports pain as  minimal .   Patient is well, but has had some minor complaints of mild swelling of the knee. Plan is to go Home after hospital stay. Negative for chest pain and shortness of breath Fever: no Gastrointestinal: negative for nausea and vomiting.    Objective: Vital signs in last 24 hours: Temp:  [97.7 F (36.5 C)-98.4 F (36.9 C)] 98.4 F (36.9 C) (02/11 0758) Pulse Rate:  [75-100] 86 (02/11 0758) Resp:  [14-17] 16 (02/11 0758) BP: (125-138)/(76-85) 125/76 (02/11 0758) SpO2:  [98 %-100 %] 98 % (02/11 0758)  Intake/Output from previous day:  Intake/Output Summary (Last 24 hours) at 03/24/2021 1038 Last data filed at 03/24/2021 1034 Gross per 24 hour  Intake 470 ml  Output 1050 ml  Net -580 ml    Intake/Output this shift: Total I/O In: 120 [P.O.:120] Out: -   Labs: Recent Labs    03/22/21 1350 03/23/21 0524 03/24/21 0448  HGB 12.5* 11.3* 10.5*   Recent Labs    03/23/21 0524 03/24/21 0448  WBC 11.8* 11.1*  RBC 3.86* 3.60*  HCT 33.4* 31.0*  PLT 321 382   Recent Labs    03/23/21 0524 03/24/21 0448  NA 134* 136  K 4.6 4.0  CL 105 107  CO2 23 23  BUN 15 12  CREATININE 0.76 0.75  GLUCOSE 153* 108*  CALCIUM 8.6* 8.4*   Recent Labs    03/23/21 0524  INR 1.0     EXAM General - Patient is Alert, Appropriate, and Oriented Extremity - Neurovascular intact Dorsiflexion/Plantar flexion intact Compartment soft Dressing/Incision -clean, dry, no drainage Motor Function - intact, moving foot and toes well on exam.     Assessment/Plan: 2 Days Post-Op Procedure(s) (LRB): ARTHROSCOPY KNEE (Right) Principal Problem:   Septic arthritis of knee (HCC) Active Problems:   IVDU (intravenous drug user)   Hyponatremia   Leukocytosis  Estimated body mass index is 24.67 kg/m as calculated from the following:   Height as of this encounter: 5\' 10"  (1.778 m).    Weight as of this encounter: 78 kg. Advance diet Up with therapy  Labs reviewed, no growth from cultures so far.  Patient seen with Dr . Discharge procedures explained to patient. Explained mild swelling of knee is normal post-operatively  Advised patient make f/u with rheumatology.   Patient to f/u in office in 2 weeks for suture removal.   Dressing changed, ABDs secured with ACE wrap.      DVT Prophylaxis - Lovenox Weight-Bearing as tolerated to right leg  Allena Katz, PA-C [facility] Orthopaedic Surgery 03/24/2021, 10:38 AM

## 2021-03-24 NOTE — TOC Transition Note (Signed)
Transition of Care [facility]) - CM/SW Discharge Note   Patient Details  Name: Lance Marquez MRN: [id number] Date of Birth: 31-Jul-1995  Transition of Care [facility]) CM/SW Contact:  Elliot Gurney [place], [place] Phone Number: 03/24/2021, 1:15 PM   Clinical Narrative:    Patient to discharge home today. Patient's father at bedside to provide transport home.  Patient previously provided with the [facility] application and Drug Abuse resources by [facility].  Per chart review patient will obtain non narcotic medication at med mgt. Patient requesting crutches, and was told that they would be provided by the surgery center. [facility] contacted, they do not have crutches at this time. Crutches provided by [facility] as patient is without insurance.   Teralyn Mullins, LCSW Transition of Care [phone]    Final next level of care: Home/Self Care Barriers to Discharge: Continued Medical Work up   Patient Goals and CMS Choice        Discharge Placement                       Discharge Plan and Services   Discharge Planning Services: CM Consult, Medication Assistance, [place] [facility] (drug abuse resources)            DME Arranged: N/A         HH Arranged: NA          Social Determinants of Health (SDOH) Interventions     Readmission Risk Interventions No flowsheet data found.

## 2021-03-24 NOTE — Discharge Summary (Signed)
Physician Discharge Summary   Patient: Lance Marquez MRN: [id number] DOB: Aug 20, 1995  Admit date:     03/22/2021  Discharge date: 03/24/21  Discharge Physician: Aline August   PCP: Pcp, No   Recommendations at discharge:   Follow-up with PCP within a week Outpatient follow-up with orthopedics.  Discharge wound care/pain management/DVT prophylaxis as per orthopedics recommendations. Recommend outpatient evaluation and follow-up by rheumatology Comply with medications and follow-up.  Abstain from illicit drug use Follow-up in the ED if symptoms worsen or new appear  Brief narrative: 26 year old male with history of intravenous drug use, lower extremity cellulitis almost a month ago treated with oral clindamycin as an outpatient presented with right knee pain.  On presentation, he had leukocytosis.  Right knee arthrocentesis showed synovial fluid with WBCs of 51,000.  He was started on broad-spectrum antibiotics.  He underwent left knee I&D by orthopedics on 03/22/2021.  Subsequently, synovitic fluid has remained negative.  He is afebrile, hemodynamically stable and tolerating diet.  Orthopedics thinks that patient might have inflammatory arthritis and recommend outpatient rheumatology evaluation and follow-up and no need for any further antibiotics on discharge.  He will be discharged home today with outpatient follow-up with orthopedics and rheumatology.  Discharge diagnoses and hospital course:  Possible inflammatory arthritis ([place])- (present on admission) Sepsis: Has been ruled out -Patient presented with fever, tachycardia, leukocytosis with concern for right knee septic arthritis. -Right knee arthrocentesis in the ED revealed synovial fluid with 51,000 WBCs. -Status post I&D by orthopedics on 03/22/2021: Findings possibly from some kind of inflammatory arthritis rather than septic arthritis as per orthopedics.  Currently on broad-spectrum antibiotics but patient's synovial fluid  culture has remained negative so far.  He is currently afebrile with very minimal leukocytosis.  Orthopedics recommending to discontinue antibiotics and discharging the patient home today with outpatient follow-up with orthopedics and rheumatology.  He will need aspirin 325 mg daily for 2 weeks for DVT prophylaxis as per orthopedics.  Discharge pain management as per orthopedics.  Leukocytosis- (present on admission) - Improving.  Very mild.  IVDU (intravenous drug user)- (present on admission) - Counseled regarding cessation.  Social worker spoke to the patient regarding the same.   Hyponatremia- (present on admission) -Resolved.          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   aspirin EC 325 MG tablet Take 1 tablet (325 mg total) by mouth daily for 14 days. Start taking on: March 25, 2021   methocarbamol 500 MG tablet Commonly known as: ROBAXIN Take 1 tablet (500 mg total) by mouth every 6 (six) hours as needed for  muscle spasms.   naproxen 500 MG tablet Commonly known as: Naprosyn Take 1 tablet (500 mg total) by mouth 2 (two) times daily with a meal.   oxyCODONE 5 MG immediate release tablet Commonly known as: Oxy IR/ROXICODONE Take 1-2 tablets (5-10 mg  total) by mouth every 4 (four) hours as needed for moderate pain (pain score 4-6).   senna-docusate 8.6-50 MG tablet Commonly known as: Senokot-S Take 1 tablet by mouth at bedtime as needed for mild constipation.               Discharge Care Instructions  (From admission, onward)           Start     Ordered   03/24/21 0000  Discharge wound care:       Comments: As per orthopedic recommendations   03/24/21 1053            Follow-up Information     Reche Dixon, PA-C Follow up in 2 week(s).   Specialty: Orthopedic Surgery Why: Postop check for suture removal Contact information: [address] [place] [address] [phone]         PCP. Schedule an appointment as soon as possible for a visit in 1 week(s).          Rheumatologist. Schedule an appointment as soon as possible for a visit in 1 week(s).                Subjective: Patient seen and examined at bedside.  Frustrated that he is not getting sleep at night.  Denies worsening knee pain.  No overnight fever or vomiting reported.  Discharge Exam: Filed Weights   03/22/21 1349 03/22/21 2204  Weight: 78 kg 78 kg   General: No acute distress, currently on room air.  Gets intermittently angry and upset respiratory: Decreased breath sounds at bases bilaterally, no wheezing CVS: Rate controlled; S1-S2 heard  abdominal: Soft, nontender, nondistended, no organomegaly; normal bowel sounds heard.   Extremities: Right knee dressing present.  No cyanosis   Condition at discharge: good  The results of significant diagnostics from this hospitalization (including imaging, microbiology, ancillary and laboratory) are listed below for reference.   Imaging Studies: DG Knee Complete 4 Views Right  Result Date: 03/22/2021 CLINICAL DATA:  Septic knee. EXAM: RIGHT KNEE - COMPLETE 4+ VIEW COMPARISON:  None. FINDINGS: No evidence of fracture, dislocation, or joint effusion. No  evidence of arthropathy or other focal bone abnormality. Soft tissues are unremarkable. IMPRESSION: Negative. Electronically Signed   By: Marijo Conception M.D.   On: 03/22/2021 16:11   DG Foot Complete Left  Result Date: 03/01/2021 CLINICAL DATA:  Recent insect bite in the foot with swelling and pain, initial encounter EXAM: LEFT FOOT - COMPLETE 3+ VIEW COMPARISON:  None. FINDINGS: No acute fracture or dislocation is noted. Soft tissue swelling is noted distally. No other focal abnormality is noted. IMPRESSION: Soft tissue swelling without acute bony abnormality. Electronically Signed   By: Inez Catalina M.D.   On: 03/01/2021 22:47    Microbiology: Results for orders placed or performed during the hospital encounter of 03/22/21  Blood culture (routine x 2)     Status: None (Preliminary result)   Collection Time: 03/22/21  3:43 PM   Specimen: BLOOD  Result Value Ref Range Status   Specimen Description BLOOD BLOOD RIGHT FOREARM  Final   Special Requests   Final    BOTTLES DRAWN AEROBIC AND ANAEROBIC Blood Culture adequate volume  Culture   Final    NO GROWTH 2 DAYS Performed at [facility], [address] [place], [place] [address]    Report Status PENDING  Incomplete  Blood culture (routine x 2)     Status: None (Preliminary result)   Collection Time: 03/22/21  3:43 PM   Specimen: BLOOD  Result Value Ref Range Status   Specimen Description BLOOD BLOOD LEFT FOREARM  Final   Special Requests   Final    BOTTLES DRAWN AEROBIC AND ANAEROBIC Blood Culture adequate volume   Culture   Final    NO GROWTH 2 DAYS Performed at [facility], [address] [place], [place] [address]    Report Status PENDING  Incomplete  Resp Panel by RT-PCR (Flu A&B, Covid) Nasopharyngeal Swab     Status: None   Collection Time: 03/22/21  3:43 PM   Specimen: Nasopharyngeal Swab; Nasopharyngeal(NP) swabs in vial transport medium  Result Value Ref Range Status   SARS Coronavirus 2 by RT PCR  NEGATIVE NEGATIVE Final    Comment: (NOTE) SARS-CoV-2 target nucleic acids are NOT DETECTED.  The SARS-CoV-2 RNA is generally detectable in upper respiratory specimens during the acute phase of infection. The lowest concentration of SARS-CoV-2 viral copies this assay can detect is 138 copies/mL. A negative result does not preclude SARS-Cov-2 infection and should not be used as the sole basis for treatment or other patient management decisions. A negative result may occur with  improper specimen collection/handling, submission of specimen other than nasopharyngeal swab, presence of viral mutation(s) within the areas targeted by this assay, and inadequate number of viral copies(<138 copies/mL). A negative result must be combined with clinical observations, patient history, and epidemiological information. The expected result is Negative.  Fact Sheet for Patients:  EntrepreneurPulse.com.au  Fact Sheet for Healthcare Providers:  IncredibleEmployment.be  This test is no t yet approved or cleared by the [place] FDA and  has been authorized for detection and/or diagnosis of SARS-CoV-2 by FDA under an Emergency Use Authorization (EUA). This EUA will remain  in effect (meaning this test can be used) for the duration of the COVID-19 declaration under Section 564(b)(1) of the Act, 21 U.S.C.section 360bbb-3(b)(1), unless the authorization is terminated  or revoked sooner.       Influenza A by PCR NEGATIVE NEGATIVE Final   Influenza B by PCR NEGATIVE NEGATIVE Final    Comment: (NOTE) The Xpert Xpress SARS-CoV-2/FLU/RSV plus assay is intended as an aid in the diagnosis of influenza from Nasopharyngeal swab specimens and should not be used as a sole basis for treatment. Nasal washings and aspirates are unacceptable for Xpert Xpress SARS-CoV-2/FLU/RSV testing.  Fact Sheet for Patients: EntrepreneurPulse.com.au  Fact Sheet for  Healthcare Providers: IncredibleEmployment.be  This test is not yet approved or cleared by the [place] FDA and has been authorized for detection and/or diagnosis of SARS-CoV-2 by FDA under an Emergency Use Authorization (EUA). This EUA will remain in effect (meaning this test can be used) for the duration of the COVID-19 declaration under Section 564(b)(1) of the Act, 21 U.S.C. section 360bbb-3(b)(1), unless the authorization is terminated or revoked.  Performed at [facility], [address] [place],  [address]   [place] rt PCR [facility] only)     Status: None   Collection Time: 03/22/21  3:43 PM   Specimen: Urine  Result Value Ref Range Status   Specimen source GC/Chlam URINE, RANDOM  Final   Chlamydia Tr NOT DETECTED NOT DETECTED Final  N gonorrhoeae NOT DETECTED NOT DETECTED Final    Comment: (NOTE) This CT/NG assay has not been evaluated in patients with a history of  hysterectomy. Performed at [facility], [address] [place], [place] [address]   Body fluid culture w Gram Stain     Status: None (Preliminary result)   Collection Time: 03/22/21  4:00 PM   Specimen: KNEE; Body Fluid  Result Value Ref Range Status   Specimen Description   Final    KNEE Performed at [facility], [address] [place], [place] [address]    Special Requests   Final    Normal Performed at [facility], [address] [place], [place] [address]    Gram Stain   Final    NO SQUAMOUS EPITHELIAL CELLS SEEN MODERATE WBC SEEN NO ORGANISMS SEEN Performed at [facility], [place] [address] [place], [place] [address]    Culture PENDING  Incomplete   Report Status PENDING  Incomplete  Aerobic/Anaerobic Culture w Gram Stain (surgical/deep wound)     Status: None (Preliminary result)   Collection Time: 03/22/21  8:29 PM   Specimen: PATH Other; Tissue  Result Value Ref Range Status   Specimen Description    Final    FOOT RIGHTKNEE Performed at [facility], [address] [place], [place] [address]    Special Requests   Final    NONE Performed at [facility], [address] [place], [place] [address]    Gram Stain   Final    NO SQUAMOUS EPITHELIAL CELLS SEEN MODERATE WBC SEEN NO ORGANISMS SEEN Performed at [facility], [place] [address], [place], [place] [address]    Culture PENDING  Incomplete   Report Status PENDING  Incomplete    Labs: CBC: Recent Labs  Lab 03/22/21 1350 03/23/21 0524 03/24/21 0448  WBC 18.8* 11.8* 11.1*  NEUTROABS  --   --  6.4  HGB 12.5* 11.3* 10.5*  HCT 36.9* 33.4* 31.0*  MCV 87.4 86.5 86.1  PLT 263 321 [id number]   Basic Metabolic Panel: Recent Labs  Lab 03/22/21 1350 03/23/21 0524 03/24/21 0448  NA 133* 134* 136  K 4.5 4.6 4.0  CL 102 105 107  CO2 24 23 23   GLUCOSE 112* 153* 108*  BUN 15 15 12   CREATININE 0.79 0.76 0.75  CALCIUM 9.2 8.6* 8.4*  MG  --   --  2.1   Liver Function Tests: Recent Labs  Lab 03/23/21 0524  AST 15  ALT 12  ALKPHOS 88  BILITOT 0.4  PROT 7.5  ALBUMIN 3.0*   CBG: No results for input(s): GLUCAP in the last 168 hours.  Discharge time spent: greater than 30 minutes.  Signed: Aline August, MD [facility] 03/24/2021

## 2021-03-26 LAB — BODY FLUID CULTURE W GRAM STAIN
Culture: NO GROWTH
Gram Stain: NONE SEEN
Special Requests: NORMAL

## 2021-03-27 LAB — CULTURE, BLOOD (ROUTINE X 2)
Culture: NO GROWTH
Culture: NO GROWTH
Special Requests: ADEQUATE
Special Requests: ADEQUATE

## 2021-03-27 NOTE — Progress Notes (Signed)
Non-excisional debridement

## 2021-03-28 LAB — AEROBIC/ANAEROBIC CULTURE W GRAM STAIN (SURGICAL/DEEP WOUND)
Culture: NO GROWTH
Gram Stain: NONE SEEN

## 2021-04-16 ENCOUNTER — Emergency Department: Payer: Self-pay

## 2021-04-16 ENCOUNTER — Encounter: Payer: Self-pay | Admitting: Emergency Medicine

## 2021-04-16 ENCOUNTER — Inpatient Hospital Stay: Payer: Self-pay

## 2021-04-16 ENCOUNTER — Inpatient Hospital Stay
Admission: EM | Admit: 2021-04-16 | Discharge: 2021-04-18 | DRG: 186 | Disposition: A | Discharge status: Other Acute Inpatient Hospital | Payer: Self-pay | Attending: Internal Medicine | Admitting: Internal Medicine

## 2021-04-16 ENCOUNTER — Other Ambulatory Visit: Payer: Self-pay

## 2021-04-16 DIAGNOSIS — Z881 Allergy status to other antibiotic agents status: Secondary | ICD-10-CM

## 2021-04-16 DIAGNOSIS — J869 Pyothorax without fistula: Secondary | ICD-10-CM | POA: Diagnosis present

## 2021-04-16 DIAGNOSIS — J90 Pleural effusion, not elsewhere classified: Principal | ICD-10-CM

## 2021-04-16 DIAGNOSIS — F1123 Opioid dependence with withdrawal: Secondary | ICD-10-CM | POA: Diagnosis not present

## 2021-04-16 DIAGNOSIS — E871 Hypo-osmolality and hyponatremia: Secondary | ICD-10-CM | POA: Diagnosis present

## 2021-04-16 DIAGNOSIS — Z9889 Other specified postprocedural states: Secondary | ICD-10-CM

## 2021-04-16 DIAGNOSIS — Z20822 Contact with and (suspected) exposure to covid-19: Secondary | ICD-10-CM | POA: Diagnosis present

## 2021-04-16 DIAGNOSIS — J181 Lobar pneumonia, unspecified organism: Secondary | ICD-10-CM | POA: Diagnosis present

## 2021-04-16 DIAGNOSIS — F17210 Nicotine dependence, cigarettes, uncomplicated: Secondary | ICD-10-CM | POA: Diagnosis present

## 2021-04-16 DIAGNOSIS — A419 Sepsis, unspecified organism: Secondary | ICD-10-CM

## 2021-04-16 DIAGNOSIS — F1920 Other psychoactive substance dependence, uncomplicated: Secondary | ICD-10-CM | POA: Diagnosis present

## 2021-04-16 DIAGNOSIS — Z681 Body mass index (BMI) 19 or less, adult: Secondary | ICD-10-CM

## 2021-04-16 DIAGNOSIS — L089 Local infection of the skin and subcutaneous tissue, unspecified: Secondary | ICD-10-CM | POA: Diagnosis present

## 2021-04-16 DIAGNOSIS — Z888 Allergy status to other drugs, medicaments and biological substances status: Secondary | ICD-10-CM

## 2021-04-16 DIAGNOSIS — F1990 Other psychoactive substance use, unspecified, uncomplicated: Secondary | ICD-10-CM

## 2021-04-16 DIAGNOSIS — J45909 Unspecified asthma, uncomplicated: Secondary | ICD-10-CM | POA: Diagnosis present

## 2021-04-16 DIAGNOSIS — E46 Unspecified protein-calorie malnutrition: Secondary | ICD-10-CM | POA: Diagnosis present

## 2021-04-16 LAB — RESP PANEL BY RT-PCR (FLU A&B, COVID) ARPGX2
Influenza A by PCR: NEGATIVE
Influenza B by PCR: NEGATIVE
SARS Coronavirus 2 by RT PCR: NEGATIVE

## 2021-04-16 LAB — LACTATE DEHYDROGENASE, PLEURAL OR PERITONEAL FLUID: LD, Fluid: 417 U/L — ABNORMAL HIGH (ref 3–23)

## 2021-04-16 LAB — CBC
HCT: 36.1 % — ABNORMAL LOW (ref 39.0–52.0)
Hemoglobin: 11.9 g/dL — ABNORMAL LOW (ref 13.0–17.0)
MCH: 28.7 pg (ref 26.0–34.0)
MCHC: 33 g/dL (ref 30.0–36.0)
MCV: 87.2 fL (ref 80.0–100.0)
Platelets: 404 10*3/uL — ABNORMAL HIGH (ref 150–400)
RBC: 4.14 MIL/uL — ABNORMAL LOW (ref 4.22–5.81)
RDW: 15.3 % (ref 11.5–15.5)
WBC: 21.5 10*3/uL — ABNORMAL HIGH (ref 4.0–10.5)
nRBC: 0 % (ref 0.0–0.2)

## 2021-04-16 LAB — GLUCOSE, PLEURAL OR PERITONEAL FLUID: Glucose, Fluid: 34 mg/dL

## 2021-04-16 LAB — LACTIC ACID, PLASMA: Lactic Acid, Venous: 0.9 mmol/L (ref 0.5–1.9)

## 2021-04-16 LAB — HEPATIC FUNCTION PANEL
ALT: 27 U/L (ref 0–44)
AST: 25 U/L (ref 15–41)
Albumin: 2.9 g/dL — ABNORMAL LOW (ref 3.5–5.0)
Alkaline Phosphatase: 116 U/L (ref 38–126)
Bilirubin, Direct: 0.3 mg/dL — ABNORMAL HIGH (ref 0.0–0.2)
Indirect Bilirubin: 0.7 mg/dL (ref 0.3–0.9)
Total Bilirubin: 1 mg/dL (ref 0.3–1.2)
Total Protein: 7.9 g/dL (ref 6.5–8.1)

## 2021-04-16 LAB — BASIC METABOLIC PANEL
Anion gap: 7 (ref 5–15)
BUN: 9 mg/dL (ref 6–20)
CO2: 25 mmol/L (ref 22–32)
Calcium: 8.5 mg/dL — ABNORMAL LOW (ref 8.9–10.3)
Chloride: 97 mmol/L — ABNORMAL LOW (ref 98–111)
Creatinine, Ser: 0.58 mg/dL — ABNORMAL LOW (ref 0.61–1.24)
GFR, Estimated: >60 mL/min (ref >60)
Glucose, Bld: 98 mg/dL (ref 70–99)
Potassium: 3.7 mmol/L (ref 3.5–5.1)
Sodium: 129 mmol/L — ABNORMAL LOW (ref 135–145)

## 2021-04-16 LAB — BODY FLUID CELL COUNT WITH DIFFERENTIAL
Eos, Fluid: 1 %
Lymphs, Fluid: 2 %
Monocyte-Macrophage-Serous Fluid: 2 %
Neutrophil Count, Fluid: 95 %
Other Cells, Fluid: 0 %
Total Nucleated Cell Count, Fluid: 3932 uL

## 2021-04-16 LAB — ALBUMIN, PLEURAL OR PERITONEAL FLUID: Albumin, Fluid: 2.7 g/dL

## 2021-04-16 LAB — TROPONIN I (HIGH SENSITIVITY)
Troponin I (High Sensitivity): 6 ng/L (ref <18)
Troponin I (High Sensitivity): 7 ng/L (ref <18)

## 2021-04-16 LAB — PROTEIN, PLEURAL OR PERITONEAL FLUID: Total protein, fluid: 6 g/dL

## 2021-04-16 MED ORDER — SODIUM CHLORIDE 0.9 % IV BOLUS
1000.0000 mL | Freq: Once | INTRAVENOUS | Status: AC
Start: 2021-04-16 — End: 2021-04-16
  Administered 2021-04-16: 1000 mL via INTRAVENOUS

## 2021-04-16 MED ORDER — IOHEXOL 350 MG/ML SOLN
75.0000 mL | Freq: Once | INTRAVENOUS | Status: AC | PRN
  Administered 2021-04-16: 75 mL via INTRAVENOUS
  Filled 2021-04-16: qty 75

## 2021-04-16 MED ORDER — LINEZOLID 600 MG/300ML IV SOLN
600.0000 mg | Freq: Two times a day (BID) | INTRAVENOUS | Status: DC
  Administered 2021-04-16: 600 mg via INTRAVENOUS
  Filled 2021-04-16 (×3): qty 300

## 2021-04-16 MED ORDER — LIDOCAINE 5 % EX PTCH
1.0000 | MEDICATED_PATCH | CUTANEOUS | Status: DC
  Administered 2021-04-16 – 2021-04-18 (×3): 1 via TRANSDERMAL
  Filled 2021-04-16 (×3): qty 1

## 2021-04-16 MED ORDER — PIPERACILLIN-TAZOBACTAM 3.375 G IVPB 30 MIN
3.3750 g | Freq: Once | INTRAVENOUS | Status: AC
Start: 2021-04-16 — End: 2021-04-16
  Administered 2021-04-16: 3.375 g via INTRAVENOUS
  Filled 2021-04-16: qty 50

## 2021-04-16 MED ORDER — LINEZOLID 600 MG/300ML IV SOLN
600.0000 mg | Freq: Two times a day (BID) | INTRAVENOUS | Status: DC
  Administered 2021-04-16 – 2021-04-18 (×4): 600 mg via INTRAVENOUS
  Filled 2021-04-16 (×5): qty 300

## 2021-04-16 MED ORDER — PIPERACILLIN-TAZOBACTAM 3.375 G IVPB
3.3750 g | Freq: Three times a day (TID) | INTRAVENOUS | Status: DC
  Administered 2021-04-17 – 2021-04-18 (×6): 3.375 g via INTRAVENOUS
  Filled 2021-04-16 (×6): qty 50

## 2021-04-16 MED ORDER — ENOXAPARIN SODIUM 40 MG/0.4ML IJ SOSY
40.0000 mg | PREFILLED_SYRINGE | INTRAMUSCULAR | Status: DC
  Administered 2021-04-16 – 2021-04-17 (×2): 40 mg via SUBCUTANEOUS
  Filled 2021-04-16 (×2): qty 0.4

## 2021-04-16 MED ORDER — ACETAMINOPHEN 500 MG PO TABS
1000.0000 mg | ORAL_TABLET | Freq: Once | ORAL | Status: AC
  Administered 2021-04-16: 1000 mg via ORAL
  Filled 2021-04-16: qty 2

## 2021-04-16 MED ORDER — LEVOFLOXACIN IN D5W 750 MG/150ML IV SOLN: 750.0000 mg | INTRAVENOUS | Status: DC

## 2021-04-16 NOTE — ED Notes (Signed)
Called [facility] [place] for cardiothoracic  [phone] ?

## 2021-04-16 NOTE — ED Triage Notes (Signed)
Pt via EMS from home. Pt c/o L sided chest tenderness, more painful when palpated states it hurt to take a deep breath but denies any recent injuries to it. Pt is A&Ox4 and NAD. EMS report HR 121, BP 132/86, 96% on RA. Pt is a heroin user but states that he hasn't done any in 3 days.  ?

## 2021-04-16 NOTE — Sepsis Progress Note (Signed)
Code sepsis protocol being monitored by e[employer]. 

## 2021-04-16 NOTE — Assessment & Plan Note (Addendum)
Patient at risk for MRSA infection ?Continue empiric antibiotic therapy with Zosyn  and Zyvox ?

## 2021-04-16 NOTE — ED Provider Notes (Addendum)
? ?[facility] ?Provider Note ? ? ? Event Date/Time  ? First MD Initiated Contact with Patient 04/16/21 [phone]   ?  (approximate) ? ? ?History  ? ?Chest Pain ? ? ?HPI ? ?Lance Marquez is a 26 y.o. male with history of IV drug use with recent admission a month ago for septic right knee who comes in with chest pain.  Patient reports having chest pain over the past 2 to 3 days.  Worse with pushing on it.  He denies any injury.  He reports feeling short of breath and head hurting when he breathes.  He denies this ever happening previously. ? ?Physical Exam  ? ?Triage Vital Signs: ?ED Triage Vitals  ?Enc Vitals Group  ?   BP 04/16/21 0914 139/79  ?   Pulse Rate 04/16/21 0914 (!) 118  ?   Resp 04/16/21 0914 18  ?   Temp 04/16/21 0914 98.1 ?F (36.7 ?C)  ?   Temp Source 04/16/21 0914 Oral  ?   SpO2 04/16/21 0914 97 %  ?   Weight 04/16/21 0915 130 lb (59 kg)  ?   Height 04/16/21 0915 5\' 9"  (1.753 m)  ?   Head Circumference --   ?   Peak Flow --   ?   Pain Score 04/16/21 0915 9  ?   Pain Loc --   ?   Pain Edu? --   ?   Excl. in GC? --   ? ? ?Most recent vital signs: ?Vitals:  ? 04/16/21 0914  ?BP: 139/79  ?Pulse: (!) 118  ?Resp: 18  ?Temp: 98.1 ?F (36.7 ?C)  ?SpO2: 97%  ? ? ? ?General: Awake, no distress.  ?CV:  Good peripheral perfusion.  ?Resp:  Normal effort.  Decreased breath sounds on the left. ?Abd:  No distention.  ?Patient has multiple lesions noted on his skin ? ? ?ED Results / Procedures / Treatments  ? ?Labs ?(all labs ordered are listed, but only abnormal results are displayed) ?Labs Reviewed  ?BASIC METABOLIC PANEL - Abnormal; Notable for the following components:  ?    Result Value  ? Sodium 129 (*)   ? Chloride 97 (*)   ? Creatinine, Ser 0.58 (*)   ? Calcium 8.5 (*)   ? All other components within normal limits  ?CBC - Abnormal; Notable for the following components:  ? WBC 21.5 (*)   ? RBC 4.14 (*)   ? Hemoglobin 11.9 (*)   ? HCT 36.1 (*)   ? Platelets 404 (*)   ? All other components  within normal limits  ?HEPATIC FUNCTION PANEL - Abnormal; Notable for the following components:  ? Albumin 2.9 (*)   ? Bilirubin, Direct 0.3 (*)   ? All other components within normal limits  ?CULTURE, BLOOD (ROUTINE X 2)  ?CULTURE, BLOOD (ROUTINE X 2)  ?RESP PANEL BY RT-PCR (FLU A&B, COVID) [id number]  ?LACTIC ACID, PLASMA  ?LACTIC ACID, PLASMA  ?TROPONIN I (HIGH SENSITIVITY)  ?TROPONIN I (HIGH SENSITIVITY)  ? ? ? ?EKG ? ?My interpretation of EKG: ?Sinus tachycardia rate of 119 without any ST elevation except a little in V2, T wave inversion in lead III, normal intervals ? ? ?RADIOLOGY ?I have reviewed the xray personally and he has large left pleural effusion ? ?Ct pending  ? ? ?PROCEDURES: ? ?Critical Care performed: Yes, see critical care procedure note(s) ? ?.Critical Care ?Performed by: 06/16/21, MD ?Authorized by: Concha Se, MD  ? ?  Critical care provider statement:  ?  Critical care time (minutes):  30 ?  Critical care was necessary to treat or prevent imminent or life-threatening deterioration of the following conditions:  Sepsis and respiratory failure ?  Critical care was time spent personally by me on the following activities:  Development of treatment plan with patient or surrogate, discussions with consultants, evaluation of patient's response to treatment, examination of patient, ordering and review of laboratory studies, ordering and review of radiographic studies, ordering and performing treatments and interventions, pulse oximetry, re-evaluation of patient's condition and review of old charts ? ? ?MEDICATIONS ORDERED IN ED: ?Medications  ?lidocaine (LIDODERM) 5 % 1 patch (1 patch Transdermal Patch Applied 04/16/21 1112)  ?linezolid (ZYVOX) IVPB 600 mg (has no administration in time range)  ?piperacillin-tazobactam (ZOSYN) IVPB 3.375 g (has no administration in time range)  ?acetaminophen (TYLENOL) tablet 1,000 mg (1,000 mg Oral Given 04/16/21 1111)  ?iohexol (OMNIPAQUE) 350 MG/ML injection 75 mL  (75 mLs Intravenous Contrast Given 04/16/21 1227)  ? ? ? ?IMPRESSION / MDM / ASSESSMENT AND PLAN / ED COURSE  ?I reviewed the triage vital signs and the nursing notes. ?             ?               ? ?Differential diagnosis includes, but is not limited to, pneumothorax, pneumonia, pleural effusion.  Will get x-ray, labs to further evaluate.  Initially ordered D-dimer but his x-ray came back with large pleural effusions I do want to get a CT scan to evaluate to see if it was loculated or not.  Patient's white count is elevated at 21,000.  Patient meets sepsis criteria therefore broad-spectrum antibiotics were started with linezolid due to vanc allergy, Zosyn and. ? ?Troponin is negative.  Lactate is normal.  Sodium is slightly low but patient getting IV fluid.  White count elevated. ? ?Given concern for possible empyema will discuss with [facility] to see if patient needs to be transferred over there ? ?Dr. Cliffton Asters from [facility] recommended trying a pigtail first and discussing with pulmonary here ? ?We will discuss with the pulmonary doctor here and admit to our hospitalist ? ?I discussed with Dr. Lucianne Lei who recommend trying IR to do pigtail and admit her since CT doesn't want him there to start at [facility]. ? ?Dr Fredia Sorrow recommended starting off with thoracentesis.  Will discuss with hospital team for admission. ? ?Disacussed with hospitalist Dr. Joylene Igo who will put the fluid studies in for patient.  ? ? ? ? ? ? ?FINAL ChereLINICAL IMPRESSION(S) / ED DIAGNOSES  ? ?Final diagnoses:  ?Pleural effusion  ?IV drug user  ?Sepsis, due to unspecified organism, unspecified whether acute organ dysfunction present [facility])  ? ? ? ?Rx / DC Orders  ? ?ED Discharge Orders   ? ? None  ? ?  ? ? ? ?Note:  This document was prepared using Dragon voice recognition software and may include unintentional dictation errors. ?  ?Concha Se, MD ?04/16/21 1555 ? ?  ?Concha Se, MD ?04/16/21 1557 ? ?

## 2021-04-16 NOTE — ED Notes (Signed)
First Nurse Note:  Pt to ED via [facility]. Note written on back of stickers state that pt coming for home for "left sided chest tenderness 2-3 days ago, no injury".  ? ?Hr 120 ?BP 132/86 ?SpO2: 96% ? ?Pt is sitting in wheelchair in NAD.  ?

## 2021-04-16 NOTE — Assessment & Plan Note (Addendum)
MRIPatient presents for evaluation of left lateral chest wall pain associated with fever, chills and a productive cough. ?He has a large left pleural effusion with consolidation in the left lung ?Patient has a history of IV drug use and is at increased risk for infection with MRSA. He is allergic to vancomycin. ?Pleural fluid preliminary cultures remain negative, although consistent with exudative fluid. ?1/4 blood cultures with MRSE, most likely a contaminant. ?ID was consulted-will appreciate their input. ?-Continue with Zosyn and Zyvox for now ?

## 2021-04-16 NOTE — H&P (Addendum)
?History and Physical  ? ? ?Patient: Lance Marquez DEY:[id number] DOB: 06/08/1995 ?DOA: 04/16/2021 ?DOS: the patient was seen and examined on 04/16/2021 ?PCP: Pcp, No  ?Patient coming from: Home ? ?Chief Complaint:  ?Chief Complaint  ?Patient presents with  ? Chest Pain  ? ?HPI: Kiowa Hollar is a 26 y.o. male with medical history significant for IV drug use, recent hospitalization for septic arthritis involving the right knee who presents to the ER via EMS for evaluation of left lateral chest wall pain for about 3 days.  He rates his pain a 10 x 10 in intensity at its worst and it is worse with inspiration.  He states that he has had  fever and chills even though was not documented and has a cough occasionally productive of yellow phlegm.  Denies any recent fall or trauma.  Admits to continued IV drug use with last use about 3 days prior to this hospitalization. ?He denies having any nausea, no vomiting, no changes in his bowel habits, no diarrhea, no urinary symptoms, no leg swelling, no headache, no dizziness, no lightheadedness, no focal deficit or blurred vision. ?Chest x-ray showed a large left pleural effusion with adjacent consolidation. ?ER physician, Dr Fuller Plan consulted CT surgery at [facility] who recommended consulting interventional radiology for thoracentesis with pigtail drainage and then to consult pulmonary. ?Patient received a dose of Zyvox and Zosyn in the ER and will be admitted to the hospital for further evaluation. ? ?Review of Systems: As mentioned in the history of present illness. All other systems reviewed and are negative. ?Past Medical History:  ?Diagnosis Date  ? Asthma   ? C. difficile diarrhea   ? Diarrhea   ? Drug abuse and dependence (HCC)   ? Parasites in stool   ? Suicidal behavior   ? ?Past Surgical History:  ?Procedure Laterality Date  ? KNEE ARTHROSCOPY Right 03/22/2021  ? Procedure: ARTHROSCOPY KNEE;  Surgeon: Signa Kell, MD;  Location: [facility] ORS;  Service: Orthopedics;   Laterality: Right;  ? ?Social History:  reports that he has been smoking cigarettes. He has never used smokeless tobacco. He reports current alcohol use. He reports current drug use. Drugs: IV and Methamphetamines. ? ?Allergies  ?Allergen Reactions  ? Vancomycin Rash  ?  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 ? ?History reviewed. No pertinent family history. ? ?Prior to Admission medications   ?Medication Sig Start Date End Date Taking? Authorizing Provider  ?senna-docusate (SENOKOT-S) 8.6-50 MG tablet Take 1 tablet by mouth at bedtime as needed for mild constipation. 03/24/21  Yes Glade Lloyd, MD  ?albuterol (PROVENTIL) (2.5 MG/3ML) 0.083% nebulizer solution Inhale 2.5 mg into the lungs every 6 (six) hours as needed. ?Patient not taking: Reported on 03/22/2021    [provider]  ? ? ?Physical Exam: ?Vitals:  ? 04/16/21 1114 04/16/21 1222 04/16/21 1540 04/16/21 1604  ?BP: 140/73 130/88 114/63 136/80  ?Pulse: 100 98 96 96  ?Resp: 18 18 18 16   ?Temp:      ?TempSrc:      ?SpO2: 100% 100% 96% 97%  ?Weight:      ?Height:      ? ?Physical Exam ?Vitals and nursing note reviewed.  ?Constitutional:   ?   Appearance: He is ill-appearing.  ?HENT:  ?   Head: Normocephalic and atraumatic.  ?Eyes:  ?   Pupils: Pupils are equal, round, and reactive to light.  ?Cardiovascular:  ?   Rate and Rhythm: Tachycardia present.  ?  Heart sounds: Normal heart sounds.  ?Pulmonary:  ?   Effort: Tachypnea present.  ?   Breath sounds: Examination of the left-middle field reveals decreased breath sounds and rhonchi. Examination of the left-lower field reveals decreased breath sounds and rhonchi. Decreased breath sounds and rhonchi present.  ?Chest:  ?   Chest wall: Tenderness present.  ?   Comments: Left lateral chest wall ?Musculoskeletal:     ?   General: Normal range of motion.  ?   Cervical back: Normal range of motion and neck supple.  ?Skin: ?   General: Skin is warm and dry.  ?   Comments: Track marks on upper extremities as  well as legs  ?Neurological:  ?   General: No focal deficit present.  ?   Mental Status: He is alert and oriented to person, place, and time.  ?Psychiatric:     ?   Mood and Affect: Mood is anxious.  ? ? ?Data Reviewed: ?Relevant notes from primary care and specialist visits, past discharge summaries as available in EHR, including Care Everywhere. ?Prior diagnostic testing as pertinent to current admission diagnoses ?Updated medications and problem lists for reconciliation ?ED course, including vitals, labs, imaging, treatment and response to treatment ?Triage notes, nursing and pharmacy notes and ED provider's notes ?Notable results as noted in HPI ?Labs reviewed.  Patient noted to have a serum sodium of 129, chloride 97, white count 21.5, hemoglobin 11.9, hematocrit 36.1, lactic acid 0.9 ?Chest x-ray reviewed by me shows Large left pleural effusion with adjacent airspace consolidation. ?CT angiogram of the chest shows Large left pleural effusion with significant lower lobe and lingular collapse. Effusion may be partially loculated. Underlying pneumonia ?is not excluded. No evidence of pulmonary embolism. ?Age-indeterminate mild T2 and T3 compression fractures are probably ?chronic. ?There are no new results to review at this time. ? ?Assessment and Plan: ?* Pleural effusion on left ?Patient presents for evaluation of left lateral chest wall pain associated with fever, chills and a productive cough. ?He has a large left pleural effusion with consolidation in the left lung ?Patient has a history of IV drug use and is at increased risk for infection with MRSA. ?He is allergic to vancomycin ?We will place patient empirically on Zosyn and Zyvox ?We will request ID consult ?Follow-up results of blood cultures ? ?Lobar pneumonia (HCC) ?Patient at risk for MRSA infection ?Continue empiric antibiotic therapy with Zosyn  and Zyvox ? ?Drug abuse and dependence (HCC) ?Patient with a history of IV drug use and admits to recent  injection of heroin prior to his hospitalization ?He has track marks on his arms and legs as well ?At risk of going through withdrawal symptoms during this hospitalization ? ?Hyponatremia ?Most likely secondary to poor oral intake ?We will hydrate patient with normal saline ?Repeat sodium levels in a.m. ? ? ? ? ? Advance Care Planning:   Code Status: Full Code  ? ?Consults: Infectious disease, pulmonary, interventional radiology ? ?Family Communication: Greater than 50% of time was spent discussing patient's condition and plan of care with him at the bedside.  All questions and concerns have been addressed.  He verbalizes understanding and agrees with the plan. ? ?Severity of Illness: ?The appropriate patient status for this patient is INPATIENT. Inpatient status is judged to be reasonable and necessary in order to provide the required intensity of service to ensure the patient's safety. The patient's presenting symptoms, physical exam findings, and initial radiographic and laboratory data in the context of their  chronic comorbidities is felt to place them at high risk for further clinical deterioration. Furthermore, it is not anticipated that the patient will be medically stable for discharge from the hospital within 2 midnights of admission.  ? ?* I certify that at the point of admission it is my clinical judgment that the patient will require inpatient hospital care spanning beyond 2 midnights from the point of admission due to high intensity of service, high risk for further deterioration and high frequency of surveillance required.* ? ?Author: ?Lucile Shutters, MD ?04/16/2021 4:16 PM ? ?For on call review [email or web address].  ?

## 2021-04-16 NOTE — Procedures (Signed)
PROCEDURE SUMMARY: ? ?Successful US guided left thoracentesis. ?Yielded 670 mL of clear yellow fluid. ?Pt tolerated procedure well, however did develop pain with inspiration near end so procedure was stopped early. Small residual loculated pleural effusion is seen post procedure. ?No immediate complications. ? ?Specimen was sent for labs. ?CXR ordered. ? ?EBL < 1 mL ? ?Pattricia Boss D PA-C ?04/16/2021 ?4:01 PM ? ? ? ?

## 2021-04-16 NOTE — Assessment & Plan Note (Addendum)
Resolved.  Most likely secondary to poor p.o. intake as it resolved with IV fluid. ?-Continue to monitor ?

## 2021-04-16 NOTE — Assessment & Plan Note (Addendum)
Patient with a history of IV drug use. He has track marks on his arms and legs as well.  Admit daily use of heroin. ?Having some muscle spasms and goosebumps which is consistent with withdrawal. ?-He was extensively counseled and interested in Suboxone detox. ?-He is being started on Suboxone protocol. ?-Symptomatic management for withdrawal symptoms ?

## 2021-04-16 NOTE — Progress Notes (Signed)
CODE SEPSIS - PHARMACY COMMUNICATION ? ?**Broad Spectrum Antibiotics should be administered within 1 hour of Sepsis diagnosis** ? ?Time Code Sepsis Called/Page Received: [id number] ? ?Antibiotics Ordered: Zosyn + Linezolid ? ?Time of 1st antibiotic administration: 1349 ? ?Additional action taken by pharmacy: N/A ? ?[name] ?04/16/2021  1:17 PM  ?

## 2021-04-17 DIAGNOSIS — J869 Pyothorax without fistula: Secondary | ICD-10-CM

## 2021-04-17 DIAGNOSIS — J181 Lobar pneumonia, unspecified organism: Secondary | ICD-10-CM

## 2021-04-17 DIAGNOSIS — M009 Pyogenic arthritis, unspecified: Secondary | ICD-10-CM

## 2021-04-17 LAB — CBC
HCT: 35 % — ABNORMAL LOW (ref 39.0–52.0)
Hemoglobin: 11.7 g/dL — ABNORMAL LOW (ref 13.0–17.0)
MCH: 28.7 pg (ref 26.0–34.0)
MCHC: 33.4 g/dL (ref 30.0–36.0)
MCV: 85.8 fL (ref 80.0–100.0)
Platelets: 452 10*3/uL — ABNORMAL HIGH (ref 150–400)
RBC: 4.08 MIL/uL — ABNORMAL LOW (ref 4.22–5.81)
RDW: 15.2 % (ref 11.5–15.5)
WBC: 14.1 10*3/uL — ABNORMAL HIGH (ref 4.0–10.5)
nRBC: 0 % (ref 0.0–0.2)

## 2021-04-17 LAB — BLOOD CULTURE ID PANEL (REFLEXED) - BCID2

## 2021-04-17 LAB — BASIC METABOLIC PANEL
Anion gap: 8 (ref 5–15)
BUN: 7 mg/dL (ref 6–20)
CO2: 25 mmol/L (ref 22–32)
Calcium: 8.3 mg/dL — ABNORMAL LOW (ref 8.9–10.3)
Chloride: 102 mmol/L (ref 98–111)
Creatinine, Ser: 0.64 mg/dL (ref 0.61–1.24)
GFR, Estimated: >60 mL/min (ref >60)
Glucose, Bld: 103 mg/dL — ABNORMAL HIGH (ref 70–99)
Potassium: 4.7 mmol/L (ref 3.5–5.1)
Sodium: 135 mmol/L (ref 135–145)

## 2021-04-17 LAB — MRSA NEXT GEN BY PCR, NASAL: MRSA by PCR Next Gen: DETECTED — AB

## 2021-04-17 LAB — STREP PNEUMONIAE URINARY ANTIGEN: Strep Pneumo Urinary Antigen: NEGATIVE

## 2021-04-17 LAB — LACTIC ACID, PLASMA: Lactic Acid, Venous: 0.9 mmol/L (ref 0.5–1.9)

## 2021-04-17 MED ORDER — BUPRENORPHINE HCL-NALOXONE HCL 2-0.5 MG SL SUBL: 2.0000 | SUBLINGUAL_TABLET | SUBLINGUAL | Status: AC | PRN

## 2021-04-17 MED ORDER — METHOCARBAMOL 1000 MG/10ML IJ SOLN
500.0000 mg | Freq: Three times a day (TID) | INTRAVENOUS | Status: AC | PRN
  Administered 2021-04-17 – 2021-04-18 (×2): 500 mg via INTRAVENOUS
  Filled 2021-04-17: qty 5
  Filled 2021-04-17: qty 500
  Filled 2021-04-17: qty 5

## 2021-04-17 MED ORDER — BUPRENORPHINE HCL-NALOXONE HCL 8-2 MG SL SUBL: 1.0000 | SUBLINGUAL_TABLET | Freq: Two times a day (BID) | SUBLINGUAL | Status: DC

## 2021-04-17 MED ORDER — ACETAMINOPHEN 325 MG PO TABS
650.0000 mg | ORAL_TABLET | Freq: Four times a day (QID) | ORAL | Status: DC | PRN
  Administered 2021-04-17 (×3): 650 mg via ORAL
  Filled 2021-04-17 (×3): qty 2

## 2021-04-17 NOTE — ED Notes (Signed)
Admission profile updated. ?

## 2021-04-17 NOTE — Progress Notes (Signed)
?  Chaplain On-Call responded to Spiritual Care Consult Order from Arnetha Courser, MD:  ?"assess spiritual needs" ? ?Chaplain attempted to visit patient at 1130 hours. ? ?Nurse stated that the patient has recently come to the Unit from the Emergency Department. ?The patient is resting soundly, and the Nurse recommended a visit this afternoon. ? ?Chaplain will refer the Order to the Afternoon Chaplain On-Call. ? ?Chaplain Evelena Peat ?M.Div., [facility] ?

## 2021-04-17 NOTE — Consult Note (Signed)
Pulmonary Critical Care  Initial Consult Note  Deshon Hsiao UUV:[id number] DOB: 13-May-1995 DOA: 04/16/2021  Referring physician: Dr Francine Graven  Chief Complaint: Pleural effusion  HPI: Iver Miklas is a 26 y.o. male patient has a past medical history significant for IV drug abuse who was recently admitted to the hospital with septic arthritis in the right knee came into the hospital because he was having chest pain on the left side.  Pain was severe.  On evaluation in the emergency room he was noted to have a large pleural effusion.  The emergency room doctor called me and I had stated that the patient probably needs to have a thoracentesis done and possibly even a chest tube placed for risk of empyema.  Apparently the ER doctor had done a telephone consultation with CT surgery and they recommended that the patient be admitted to the hospitalist service for further treatment.  They recommended that interventional radiology try to do a thoracentesis and drainage.  The patient underwent thoracentesis the results of which are consistent with an empyema.  The interventional radiologist was not able to drain all of the fluid and there is some residual fluid still left on the patient's chest.  Patient states that his breathing is somewhat better but not completely back to baseline.    Review of Systems:  Constitutional:  No weight loss, night sweats, Fevers, chills, fatigue.  HEENT:  No headaches, nasal congestion, post nasal drip,  Cardio-vascular:  +chest pain, Orthopnea, PND, swelling in lower extremities, anasarca, dizziness, palpitations  GI:  No heartburn, indigestion, abdominal pain, nausea, vomiting, diarrhea  Resp:  +shortness of breath with exertion or at rest. no productive cough, No coughing up of blood.No wheezing Skin:  no rash or lesions.  Musculoskeletal:  No joint pain or swelling.   Remainder ROS performed and is unremarkable other than noted in HPI  Past Medical  History:  Diagnosis Date   Asthma    C. difficile diarrhea    Diarrhea    Drug abuse and dependence ([place])    Parasites in stool    Suicidal behavior    Past Surgical History:  Procedure Laterality Date   KNEE ARTHROSCOPY Right 03/22/2021   Procedure: ARTHROSCOPY KNEE;  Surgeon: Leim Fabry, MD;  Location: [facility] ORS;  Service: Orthopedics;  Laterality: Right;   Social History:  reports that he has been smoking cigarettes. He has never used smokeless tobacco. He reports current alcohol use. He reports current drug use. Drugs: IV and Methamphetamines.  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 No pertinent family history.  Prior to Admission medications   Medication Sig Start Date End Date Taking? Authorizing Provider  senna-docusate (SENOKOT-S) 8.6-50 MG tablet Take 1 tablet by mouth at bedtime as needed for mild constipation. 03/24/21  Yes Aline August, MD  albuterol (PROVENTIL) (2.5 MG/3ML) 0.083% nebulizer solution Inhale 2.5 mg into the lungs every 6 (six) hours as needed. Patient not taking: Reported on 03/22/2021    [provider]   Physical Exam: Vitals:   04/16/21 1604 04/16/21 2050 04/17/21 0302 04/17/21 0618  BP: 136/80 110/65 (!) 141/92 127/81  Pulse: 96 99 (!) 110 (!) 103  Resp: 16 18 18  (!) 22  Temp:      TempSrc:      SpO2: 97% 96% 98% 97%  Weight:      Height:       Body mass index  is 19.2 kg/m.   Wt Readings from Last 3 Encounters:  04/16/21 59 kg  03/22/21 78 kg  03/01/21 77.1 kg    General:  Appears calm and comfortable Eyes: PERRL, normal lids, irises & conjunctiva ENT: grossly normal hearing, lips & tongue Neck: no LAD, masses or thyromegaly Cardiovascular: RRR, no m/r/g. No LE edema. Respiratory: CTA bilaterally, no w/r/r.       Normal respiratory effort. Abdomen: soft, nontender Skin: no rash or induration seen on limited exam Musculoskeletal: grossly normal  tone BUE/BLE Psychiatric: grossly normal mood and affect Neurologic: grossly non-focal.          Labs on Admission:  Basic Metabolic Panel: Recent Labs  Lab 04/16/21 1113  NA 129*  K 3.7  CL 97*  CO2 25  GLUCOSE 98  BUN 9  CREATININE 0.58*  CALCIUM 8.5*   Liver Function Tests: Recent Labs  Lab 04/16/21 1113  AST 25  ALT 27  ALKPHOS 116  BILITOT 1.0  PROT 7.9  ALBUMIN 2.9*   No results for input(s): LIPASE, AMYLASE in the last 168 hours. No results for input(s): AMMONIA in the last 168 hours. CBC: Recent Labs  Lab 04/16/21 1113  WBC 21.5*  HGB 11.9*  HCT 36.1*  MCV 87.2  PLT 404*   Cardiac Enzymes: No results for input(s): CKTOTAL, CKMB, CKMBINDEX, TROPONINI in the last 168 hours.  BNP (last 3 results) No results for input(s): BNP in the last 8760 hours.  ProBNP (last 3 results) No results for input(s): PROBNP in the last 8760 hours.  CBG: No results for input(s): GLUCAP in the last 168 hours.  Radiological Exams on Admission: DG Chest 2 View  Result Date: 04/16/2021 CLINICAL DATA:  Left-sided chest pain. EXAM: CHEST - 2 VIEW COMPARISON:  July 13, 2015 FINDINGS: The heart size and mediastinal contours are partially obscured. Large left pleural effusion with adjacent airspace consolidation. The visualized skeletal structures are unremarkable. Metallic left nipple ornamentation. IMPRESSION: Large left pleural effusion with adjacent airspace consolidation. Electronically Signed   By: Dahlia Bailiff M.D.   On: 04/16/2021 09:45   CT Angio Chest PE W and/or Wo Contrast  Result Date: 04/16/2021 CLINICAL DATA:  Pulmonary embolism (PE) suspected, high prob EXAM: CT ANGIOGRAPHY CHEST WITH CONTRAST TECHNIQUE: Multidetector CT imaging of the chest was performed using the standard protocol during bolus administration of intravenous contrast. Multiplanar CT image reconstructions and MIPs were obtained to evaluate the vascular anatomy. RADIATION DOSE REDUCTION: This exam  was performed according to the departmental dose-optimization program which includes automated exposure control, adjustment of the mA and/or kV according to patient size and/or use of iterative reconstruction technique. CONTRAST:  73m OMNIPAQUE IOHEXOL 350 MG/ML SOLN COMPARISON:  None. FINDINGS: Cardiovascular: Satisfactory opacification of the pulmonary arteries to the segmental level. No evidence of pulmonary embolism. Normal heart size. No pericardial effusion. Mediastinum/Nodes: No enlarged nodes. Thyroid and esophagus are unremarkable. Lungs/Pleura: Large left pleural effusion. Collapse of the most of the left lower lobe and lingula. Right lung is clear. No pneumothorax. Upper Abdomen: No acute abnormality. Musculoskeletal: Age-indeterminate mild loss of height at the superior endplates of T2 and T3. Review of the MIP images confirms the above findings. IMPRESSION: Large left pleural effusion with significant lower lobe and lingular collapse. Effusion may be partially loculated. Underlying pneumonia is not excluded. No evidence of pulmonary embolism. Age-indeterminate mild T2 and T3 compression fractures are probably chronic. Electronically Signed   By: PMacy MisM.D.   On: 04/16/2021 12:39  DG Chest Port 1 View  Result Date: 04/16/2021 CLINICAL DATA:  Left thoracentesis EXAM: PORTABLE CHEST 1 VIEW COMPARISON:  Radiograph 04/16/2021, CT 04/16/2021 FINDINGS: Decreased left pleural effusion, now small-moderate size, and adjacent atelectasis after thoracentesis. No visible pneumothorax. Right lung is clear. Unchanged cardiomediastinal silhouette. No acute osseous abnormality. IMPRESSION: Decreased left pleural effusion, now small-moderate size, and adjacent opacities after thoracentesis. No visible pneumothorax. Electronically Signed   By: Maurine Simmering M.D.   On: 04/16/2021 16:23   US THORACENTESIS ASP PLEURAL SPACE W/IMG GUIDE  Result Date: 04/16/2021 INDICATION: Shortness of breath with chest pain  and left-sided pleural effusion request received for diagnostic and therapeutic thoracentesis. EXAM: ULTRASOUND GUIDED LEFT THORACENTESIS MEDICATIONS: Local 1% lidocaine only. COMPLICATIONS: None immediate. PROCEDURE: An ultrasound guided thoracentesis was thoroughly discussed with the patient and questions answered. The benefits, risks, alternatives and complications were also discussed. The patient understands and wishes to proceed with the procedure. Written consent was obtained. Ultrasound was performed to localize and mark an adequate pocket of fluid in the left chest, effusion was severely loculated. The area was then prepped and draped in the normal sterile fashion. 1% Lidocaine was used for local anesthesia. Under ultrasound guidance a 19 gauge, 7-cm, Yueh catheter was introduced. Thoracentesis was performed. Procedure was stopped early secondary to patient's complaints of pain with small remaining pleural effusion seen post procedure. The catheter was removed and a dressing applied. FINDINGS: A total of approximately 670 mL of clear yellow fluid was removed. Samples were sent to the laboratory as requested by the clinical team. IMPRESSION: Successful ultrasound guided left thoracentesis yielding 670 mL of pleural fluid. This exam was performed by Tsosie Billing PA-C, and was supervised and interpreted by Dr. Kathlene Cote. Electronically Signed   By: Aletta Edouard M.D.   On: 04/16/2021 16:23    EKG: Independently reviewed.  Assessment/Plan Principal Problem:   Pleural effusion on left Active Problems:   Hyponatremia   Drug abuse and dependence (HCC)   Lobar pneumonia (HCC)   Empyema criteria have been met with an elevated LDH and a glucose of 34.  Left side patient has had partial drainage she has significant loculations still present.  White count is still elevated.  Based on the findings of empyema and unresolving pleural effusion he is probably going to need a chest tube for drainage possibly  streptokinase should be considered.  Would recommend evaluation by thoracic surgery to perform the procedure. Pneumonia patient is on antibiotics seen by infectious diseases agree with current recommendations with the antibiotics.  My only concern would be that the patient did have a septic joint I would also recommend doing an echocardiogram and follow-up x-ray.  Code Status: Full code  Family Communication: No family present in the room Disposition Plan: Home  Time spent: 70 minutes  I have personally obtained a history, examined the patient, evaluated laboratory and imaging results, formulated the assessment and plan and placed orders.  The Patient requires high complexity decision making for assessment and support. Total Time Spent 59mn   Briseidy Spark A Lola Czerwonka, MD FOverlake Hospital Medical CenterPulmonary Critical Care Medicine Sleep Medicine

## 2021-04-17 NOTE — Progress Notes (Signed)
?   04/17/21 1600  ?Clinical Encounter Type  ?Visited With Patient not available  ?Visit Type Follow-up  ?Referral From Nurse  ?Consult/Referral To Chaplain  ? ?Chaplain followed up consult. Patient unavailable. On call Chaplain will follow up this evening. ?

## 2021-04-17 NOTE — Progress Notes (Addendum)
PHARMACY - PHYSICIAN COMMUNICATION ?CRITICAL VALUE ALERT - BLOOD CULTURE IDENTIFICATION (BCID) ? ?Lance Marquez is an 26 y.o. male who presented to [facility] on 04/16/2021 with a chief complaint of chest pain ? ?Assessment:  blood culture from 3/6 with GPC in 1 of 4 bottles.  BCID detects MRSE.  Patient on antibiotics for possible empyema ? ?Name of physician (or Provider) Contacted: Dr Nelson Chimes ? ?Current antibiotics: Linezolid/zosyn ? ?Changes to prescribed antibiotics recommended:  ?Patient is on recommended antibiotics - No changes needed.  ID to also see patient ? ?Results for orders placed or performed during the hospital encounter of 04/16/21  ?Blood Culture ID Panel (Reflexed) (Collected: 04/16/2021  1:35 PM)  ?Result Value Ref Range  ? Enterococcus faecalis NOT DETECTED NOT DETECTED  ? Enterococcus Faecium NOT DETECTED NOT DETECTED  ? Listeria monocytogenes NOT DETECTED NOT DETECTED  ? Staphylococcus species DETECTED (A) NOT DETECTED  ? Staphylococcus aureus (BCID) NOT DETECTED NOT DETECTED  ? Staphylococcus epidermidis DETECTED (A) NOT DETECTED  ? Staphylococcus lugdunensis NOT DETECTED NOT DETECTED  ? Streptococcus species NOT DETECTED NOT DETECTED  ? Streptococcus agalactiae NOT DETECTED NOT DETECTED  ? Streptococcus pneumoniae NOT DETECTED NOT DETECTED  ? Streptococcus pyogenes NOT DETECTED NOT DETECTED  ? A.calcoaceticus-baumannii NOT DETECTED NOT DETECTED  ? Bacteroides fragilis NOT DETECTED NOT DETECTED  ? Enterobacterales NOT DETECTED NOT DETECTED  ? Enterobacter cloacae complex NOT DETECTED NOT DETECTED  ? Escherichia coli NOT DETECTED NOT DETECTED  ? Klebsiella aerogenes NOT DETECTED NOT DETECTED  ? Klebsiella oxytoca NOT DETECTED NOT DETECTED  ? Klebsiella pneumoniae NOT DETECTED NOT DETECTED  ? Proteus species NOT DETECTED NOT DETECTED  ? Salmonella species NOT DETECTED NOT DETECTED  ? Serratia marcescens NOT DETECTED NOT DETECTED  ? Haemophilus influenzae NOT DETECTED NOT DETECTED  ? Neisseria  meningitidis NOT DETECTED NOT DETECTED  ? Pseudomonas aeruginosa NOT DETECTED NOT DETECTED  ? Stenotrophomonas maltophilia NOT DETECTED NOT DETECTED  ? Candida albicans NOT DETECTED NOT DETECTED  ? Candida auris NOT DETECTED NOT DETECTED  ? Candida glabrata NOT DETECTED NOT DETECTED  ? Candida krusei NOT DETECTED NOT DETECTED  ? Candida parapsilosis NOT DETECTED NOT DETECTED  ? Candida tropicalis NOT DETECTED NOT DETECTED  ? Cryptococcus neoformans/gattii NOT DETECTED NOT DETECTED  ? Methicillin resistance mecA/C DETECTED (A) NOT DETECTED  ? ?Juliette Alcide, PharmD, BCPS, BCIDP ?Work Cell: [phone] ?04/17/2021 12:50 PM ? ? ?

## 2021-04-17 NOTE — Hospital Course (Addendum)
Taken from H&P. ? ? Lance Marquez is a 26 y.o. male with medical history significant for IV drug use, recent hospitalization for septic arthritis involving the right knee who presents to the ER via EMS for evaluation of left lateral chest wall pain for about 3 days.  He rates his pain a 10 x 10 in intensity at its worst and it is worse with inspiration.  He states that he has had  fever and chills even though was not documented and has a cough occasionally productive of yellow phlegm.  Denies any recent fall or trauma.  Admits to continued IV drug use with last use about 2 days prior.  Per patient he continued to use IV heroin daily. ? ?Chest x-ray showed a large left pleural effusion with adjacent consolidation. ?ER physician, Dr Jari Pigg consulted CT surgery at [facility] who recommended consulting interventional radiology for thoracentesis with pigtail drainage and then to consult pulmonary. ? ?He was also found to have significant leukocytosis which started improving.  There was mild hyponatremia with sodium of 129 noted, thought to be due to poor p.o. intake, resolved with IV fluid. ? ?Thoracentesis was done with removal of 650 mL of fluid, preliminary labs with exudative fluid and preliminary cultures remain negative.  Patient was already on Zyvox. ? ?1/4 blood cultures bottles with MRSE, most likely a contaminant.  Infectious disease was also consulted-we will appreciate their recommendations. ? ?Patient started getting opioid withdrawal symptoms, started him on Suboxone protocol. ?He was again extensively counseled.  Will remain high risk. ?He will need a prolonged hospitalization if IV antibiotics needed. ? ?3/8: Suboxone seems working.  Infectious diseases concern about TB and they ordered airborne precautions until cleared. ?HIV screening negative, hep C pending, strep pneumo negative, pleural fluid culture remains negative.  Rest of the labs pending. ?He will continue current antibiotics at this  time. ? ?ID discussed with pulmonology and they are concerned about a trapped lung and loculated effusion. ?They advised patient to be transferred to [facility] for further management. ?Message also sent to cardiothoracic surgery to evaluate once patient reaches their. ?We will continue current management at this time. ?

## 2021-04-17 NOTE — Progress Notes (Signed)
?Progress Note ? ? ?Patient: Lance Marquez [id number] DOB: 03/04/1995 DOA: 04/16/2021     1 ?DOS: the patient was seen and examined on 04/17/2021 ?  ?Brief hospital course: ?Taken from H&P. ? ? Liam Arbon is a 26 y.o. male with medical history significant for IV drug use, recent hospitalization for septic arthritis involving the right knee who presents to the ER via EMS for evaluation of left lateral chest wall pain for about 3 days.  He rates his pain a 10 x 10 in intensity at its worst and it is worse with inspiration.  He states that he has had  fever and chills even though was not documented and has a cough occasionally productive of yellow phlegm.  Denies any recent fall or trauma.  Admits to continued IV drug use with last use about 2 days prior.  Per patient he continued to use IV heroin daily. ? ?Chest x-ray showed a large left pleural effusion with adjacent consolidation. ?ER physician, Dr Jari Pigg consulted CT surgery at [facility] who recommended consulting interventional radiology for thoracentesis with pigtail drainage and then to consult pulmonary. ? ?He was also found to have significant leukocytosis which started improving.  There was mild hyponatremia with sodium of 129 noted, thought to be due to poor p.o. intake, resolved with IV fluid. ? ?Thoracentesis was done with removal of 650 mL of fluid, preliminary labs with exudative fluid and preliminary cultures remain negative.  Patient was already on Zyvox. ? ?1/4 blood cultures bottles with MRSE, most likely a contaminant.  Infectious disease was also consulted-we will appreciate their recommendations. ? ?Patient started getting opioid withdrawal symptoms, started him on Suboxone protocol. ?He was again extensively counseled.  Will remain high risk. ?He will need a prolonged hospitalization if IV antibiotics needed. ? ? ?Assessment and Plan: ?* Pleural effusion on left ?MRIPatient presents for evaluation of left lateral chest wall pain  associated with fever, chills and a productive cough. ?He has a large left pleural effusion with consolidation in the left lung ?Patient has a history of IV drug use and is at increased risk for infection with MRSA. He is allergic to vancomycin. ?Pleural fluid preliminary cultures remain negative, although consistent with exudative fluid. ?1/4 blood cultures with MRSE, most likely a contaminant. ?ID was consulted-will appreciate their input. ?-Continue with Zosyn and Zyvox for now ? ?Lobar pneumonia ([place]) ?Patient at risk for MRSA infection ?Continue empiric antibiotic therapy with Zosyn  and Zyvox ? ?Drug abuse and dependence ([place]) ?Patient with a history of IV drug use. He has track marks on his arms and legs as well.  Admit daily use of heroin. ?Having some muscle spasms and goosebumps which is consistent with withdrawal. ?-He was extensively counseled and interested in Suboxone detox. ?-He is being started on Suboxone protocol. ?-Symptomatic management for withdrawal symptoms ? ?Hyponatremia ?Resolved.  Most likely secondary to poor p.o. intake as it resolved with IV fluid. ?-Continue to monitor ? ? ?Subjective: Patient was seen and examined today.  He was complaining of muscle spasms and some goosebumps.  No nausea, vomiting or diarrhea.  Per patient he was using heroin on a daily basis, last use was 2 days ago.  He seems interested in getting opioid treatment with Suboxone. ?Grandfather at bedside. ? ?Physical Exam: ?Vitals:  ? 04/17/21 0618 04/17/21 1030 04/17/21 1033 04/17/21 1115  ?BP: 127/81 (!) 124/94  135/86  ?Pulse: (!) 103   (!) 102  ?Resp: (!) 22 (!) 22 14 18   ?Temp:  98.3 ?F (36.8 ?C)  98.5 ?F (36.9 ?C)  ?TempSrc:  Oral  Oral  ?SpO2: 97% 97%  98%  ?Weight:      ?Height:      ? ?General.     In no acute distress. ?Pulmonary.  Lungs clear bilaterally, normal respiratory effort.  Mildly decreased breath sounds at left base. ?CV.  Regular rate and rhythm, no JVD, rub or murmur. ?Abdomen.  Soft,  nontender, nondistended, BS positive. ?CNS.  Alert and oriented x3.  No focal neurologic deficit. ?Extremities.  No edema, no cyanosis, pulses intact and symmetrical. ?Psychiatry.  Judgment and insight appears normal. ? ?Data Reviewed: ?Prior notes, labs and imaging reviewed ? ?Family Communication: Discussed with grandfather at bedside ? ?Disposition: ?Status is: Inpatient ?Remains inpatient appropriate because: Severity of illness ? ? Planned Discharge Destination: Home ? ?DVT prophylaxis.  Lovenox ? ?Time spent: 50 minutes ? ?This record has been created using Systems analyst. Errors have been sought and corrected,but may not always be located. Such creation errors do not reflect on the standard of care. ? ?Author: ?Lorella Nimrod, MD ?04/17/2021 2:58 PM ? ?For on call review [email or web address].  ?

## 2021-04-17 NOTE — Consult Note (Signed)
[facility]  Total days of antibiotics 2/linezolid plus piptazo         Reason for Consult: pleural effusion    Referring Physician: amin  Principal Problem:   Pleural effusion on left Active Problems:   Hyponatremia   Drug abuse and dependence (HCC)   Lobar pneumonia (HCC)    HPI: Lance Marquez is a 26 y.o. male with history of active heroin use and occasional meth use, hx of incarceration x 1 year released in jan 2023 who is admitted for 3 day history of shortness of breath, fever, chills, nightsweats. Denies productive cough. He was admitted on 3/6 with left lateral chest wall pain with worsening pain on inspiration. On admit, he was found to have WBC of 21.5K, plt 382. Cxr showed large left pleural effusion with airspace disease.he underwent 670-mL thoracentesis with small residual loculated pleural effusion. He was empircally started on linezolid and piptazo. Wbc down to 14K.infectious work up showed 1/4 bottles as MRSE (likely contaminant). Gram stain on pleural fluid no organism. Pleural fluid -3932 cells, neutrophilic predominance, ldh 417 with glu 34.  Last month was admitted for right knee effusion, concern for septic athritis, with wbc of 52K, 94% neutrophils, no crystals, underwent I x D but did not get treated with further abtx at discharge- he reports his knee is improved  He has scattered pustules to extremities. His MRSA screen is +  Past Medical History:  Diagnosis Date   Asthma    C. 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 Weight loss due to drug use. 12 point ros is negative  OBJECTIVE: Temp:  [98.1 F (36.7 C)-98.5 F (36.9 C)] 98.1 F (36.7 C) (03/07 1556) Pulse Rate:  [93-110] 93 (03/07 1556) Resp:  [14-22] 18 (03/07 1556) BP: (110-141)/(65-94) 127/65 (03/07 1556) SpO2:  [96 %-99 %] 99 % (03/07 1556) Physical Exam  Constitutional: He is oriented to person, place, and time. He appears ill appearing and under-nourished. No distress.  HENT:  Mouth/Throat: Oropharynx is clear and moist. No oropharyngeal exudate.  Cardiovascular: Normal rate, regular rhythm and normal heart sounds. Exam reveals no gallop and no friction rub.  No murmur heard.  Pulmonary/Chest: Effort normal and breath sounds are decreased on the left Abdominal: Soft. Bowel sounds are normal. He exhibits no distension. There is no tenderness.  Lymphadenopathy:  He has no cervical adenopathy.  Neurological: He is alert and oriented to person, place, and time.  Skin: Skin is warm and dry. No rash noted. No erythema. Scattered pustules to arms and legs bilaterally Psychiatric: He has a normal mood  and affect. His behavior is normal.   LABS: Results for orders placed or performed during the hospital encounter of 04/16/21 (from the past 48 hour(s))  Basic metabolic panel     Status: Abnormal   Collection Time: 04/16/21 11:13 AM  Result Value Ref Range   Sodium 129 (L) 135 - 145 mmol/L   Potassium 3.7 3.5 - 5.1 mmol/L   Chloride 97 (L) 98 - 111 mmol/L   CO2 25 22 - 32 mmol/L   Glucose, Bld 98 70 - 99 mg/dL    Comment: Glucose reference range applies only to samples taken after fasting for at least 8 hours.   BUN 9 6 -  20 mg/dL   Creatinine, Ser 1.61 (L) 0.61 - 1.24 mg/dL   Calcium 8.5 (L) 8.9 - 10.3 mg/dL   GFR, Estimated >09 >60 mL/min    Comment: (NOTE) Calculated using the CKD-EPI Creatinine Equation (2021)    Anion gap 7 5 - 15    Comment: Performed at [facility], [address] [place], [place] [address]  CBC     Status: Abnormal   Collection Time: 04/16/21 11:13 AM  Result Value Ref Range   WBC 21.5 (H) 4.0 - 10.5 K/uL   RBC 4.14 (L) 4.22 - 5.81 MIL/uL   Hemoglobin 11.9 (L) 13.0 - 17.0 g/dL   HCT 81.1 (L) 91.4 - 78.2 %   MCV 87.2 80.0 - 100.0 fL   MCH 28.7 26.0 - 34.0 pg   MCHC 33.0 30.0 - 36.0 g/dL   RDW 95.6 21.3 - 08.6 %   Platelets 404 (H) 150 - 400 K/uL   nRBC 0.0 0.0 - 0.2 %    Comment: Performed at [facility], [address], [place], [place] [address]  Troponin I (High Sensitivity)     Status: None   Collection Time: 04/16/21 11:13 AM  Result Value Ref Range   Troponin I (High Sensitivity) 6 <18 ng/L    Comment: (NOTE) Elevated high sensitivity troponin I (hsTnI) values and significant  changes across serial measurements may suggest ACS but many other  chronic and acute conditions are known to elevate hsTnI results.  Refer to the "Links" section for chest pain algorithms and additional  guidance. Performed at [facility], [address] [place], [place] [address]   Hepatic function panel     Status: Abnormal   Collection Time: 04/16/21 11:13 AM  Result Value Ref Range   Total Protein 7.9 6.5 - 8.1 g/dL   Albumin 2.9 (L) 3.5 - 5.0 g/dL   AST 25 15 - 41 U/L   ALT 27 0 - 44 U/L   Alkaline Phosphatase 116 38 - 126 U/L   Total Bilirubin 1.0 0.3 - 1.2 mg/dL   Bilirubin, Direct 0.3 (H) 0.0 - 0.2 mg/dL   Indirect Bilirubin 0.7 0.3 - 0.9 mg/dL    Comment: Performed at [facility], [address] [place], [place] [address]  Blood culture (routine x 2)     Status: None (Preliminary result)   Collection Time: 04/16/21  1:35 PM    Specimen: BLOOD  Result Value Ref Range   Specimen Description      BLOOD BLOOD LEFT FOREARM Performed at [facility], [address], [place], [place] [address]    Special Requests      BOTTLES DRAWN AEROBIC AND ANAEROBIC Blood Culture adequate volume Performed at [facility], [address] [place], [place] [address]    Culture  Setup Time      GRAM POSITIVE COCCI ANAEROBIC BOTTLE ONLY CRITICAL RESULT CALLED TO, READ BACK BY AND VERIFIED WITH: [facility] MITCHELL 04/17/21 1442 MW GRAM STAIN REVIEWED-AGREE WITH RESULT Performed at [facility], [address] [place], [place] [address]    Culture GRAM POSITIVE COCCI    Report Status PENDING   Blood Culture ID Panel (Reflexed)     Status: Abnormal   Collection Time: 04/16/21  1:35 PM  Result Value Ref Range   Enterococcus faecalis NOT DETECTED NOT DETECTED   Enterococcus Faecium NOT DETECTED NOT DETECTED   Listeria monocytogenes NOT DETECTED NOT DETECTED   Staphylococcus species DETECTED (A) NOT DETECTED    Comment: CRITICAL RESULT CALLED TO, READ BACK BY AND VERIFIED WITH: [facility] MITCHELL 04/17/21 1143 MW    Staphylococcus aureus (BCID) NOT DETECTED NOT DETECTED   Staphylococcus epidermidis DETECTED (A) NOT DETECTED    Comment: Methicillin (oxacillin) resistant coagulase negative staphylococcus. Possible blood culture contaminant (unless isolated from more than one blood culture draw or clinical case suggests pathogenicity). No antibiotic treatment is indicated for blood  culture contaminants. CRITICAL RESULT CALLED TO, READ BACK BY AND VERIFIED WITH: [facility] MITCHELL 04/17/21 1143 MW    Staphylococcus lugdunensis NOT DETECTED NOT DETECTED   Streptococcus species NOT DETECTED NOT DETECTED   Streptococcus agalactiae NOT DETECTED NOT DETECTED   Streptococcus pneumoniae NOT DETECTED NOT DETECTED   Streptococcus pyogenes NOT DETECTED NOT DETECTED   A.calcoaceticus-baumannii NOT DETECTED NOT DETECTED    Bacteroides fragilis NOT DETECTED NOT DETECTED   Enterobacterales NOT DETECTED NOT DETECTED   Enterobacter cloacae complex NOT DETECTED NOT DETECTED   Escherichia coli NOT DETECTED NOT DETECTED   Klebsiella aerogenes NOT DETECTED NOT DETECTED   Klebsiella oxytoca NOT DETECTED NOT DETECTED   Klebsiella pneumoniae NOT DETECTED NOT DETECTED   Proteus species NOT DETECTED NOT DETECTED   Salmonella species NOT DETECTED NOT DETECTED   Serratia marcescens NOT DETECTED NOT DETECTED   Haemophilus influenzae NOT DETECTED NOT DETECTED   Neisseria meningitidis NOT DETECTED NOT DETECTED   Pseudomonas aeruginosa NOT DETECTED NOT DETECTED   Stenotrophomonas maltophilia NOT DETECTED NOT DETECTED   Candida albicans NOT DETECTED NOT DETECTED   Candida auris NOT DETECTED NOT DETECTED   Candida glabrata NOT DETECTED NOT DETECTED   Candida krusei NOT DETECTED NOT DETECTED   Candida parapsilosis NOT DETECTED NOT DETECTED   Candida tropicalis NOT DETECTED NOT DETECTED   Cryptococcus neoformans/gattii NOT DETECTED NOT DETECTED   Methicillin resistance mecA/C DETECTED (A) NOT DETECTED    Comment: CRITICAL RESULT CALLED TO, READ BACK BY AND VERIFIED WITH: Drusilla Kanner 04/17/21 1143 MW Performed at [facility], [address] [place] [address] [place], [place] [address]   Blood culture (routine x 2)     Status: None (Preliminary result)   Collection Time: 04/16/21  1:42 PM   Specimen: BLOOD  Result Value Ref Range   Specimen Description BLOOD BLOOD RIGHT HAND    Special Requests      BOTTLES DRAWN AEROBIC AND ANAEROBIC Blood Culture adequate volume   Culture      NO GROWTH < 24 HOURS Performed at [facility], [address] [place], [place] [address]    Report Status PENDING   Lactic acid, plasma     Status: None   Collection Time: 04/16/21  1:42 PM  Result Value Ref Range   Lactic Acid, Venous 0.9 0.5 - 1.9 mmol/L    Comment: Performed at [facility], [address]  [place], [place] [address]  Troponin I (High Sensitivity)     Status: None   Collection Time: 04/16/21  2:00 PM  Result Value Ref Range   Troponin I (High Sensitivity) 7 <18 ng/L    Comment: (NOTE) Elevated high sensitivity troponin I (hsTnI) values and significant  changes across serial measurements may suggest ACS but many other  chronic and acute conditions are known to elevate hsTnI results.  Refer to the "Links" section for chest pain algorithms and additional  guidance. Performed at [facility], [address] [place], [place] [address]   Resp Panel by RT-PCR (Flu A&B, Covid) Nasopharyngeal Swab     Status: None   Collection Time: 04/16/21  2:05 PM   Specimen: Nasopharyngeal Swab; Nasopharyngeal(NP) swabs in vial transport medium  Result Value Ref Range   SARS Coronavirus 2 by RT PCR NEGATIVE NEGATIVE    Comment: (NOTE) SARS-CoV-2 target nucleic acids are NOT DETECTED.  The SARS-CoV-2 RNA is generally detectable in upper respiratory specimens during the acute phase of infection. The lowest concentration of SARS-CoV-2 viral copies this assay can detect is 138 copies/mL. A negative result does not preclude SARS-Cov-2 infection and should not be used as the sole basis for treatment or other patient management decisions. A negative result may occur with  improper specimen collection/handling, submission of specimen other than nasopharyngeal swab, presence of viral mutation(s) within the areas targeted by this assay, and inadequate number of viral copies(<138 copies/mL). A negative result must be combined with clinical observations, patient history, and epidemiological information. The expected result is Negative.  Fact Sheet for Patients:  BloggerCourse.com  Fact Sheet for Healthcare Providers:  SeriousBroker.it  This test is no t yet approved or cleared by the [place] FDA and  has been authorized for detection  and/or diagnosis of SARS-CoV-2 by FDA under an Emergency Use Authorization (EUA). This EUA will remain  in effect (meaning this test can be used) for the duration of the COVID-19 declaration under Section 564(b)(1) of the Act, 21 U.S.C.section 360bbb-3(b)(1), unless the authorization is terminated  or revoked sooner.       Influenza A by PCR NEGATIVE NEGATIVE   Influenza B by PCR NEGATIVE NEGATIVE    Comment: (NOTE) The Xpert Xpress SARS-CoV-2/FLU/RSV plus assay is intended as an aid in the diagnosis of influenza from Nasopharyngeal swab specimens and should not be used as a sole basis for treatment. Nasal washings and aspirates are unacceptable for Xpert Xpress SARS-CoV-2/FLU/RSV testing.  Fact Sheet for Patients: BloggerCourse.com  Fact Sheet for Healthcare Providers: SeriousBroker.it  This test is not yet approved or cleared by the [place] FDA and has been authorized for detection and/or diagnosis of SARS-CoV-2 by FDA under an Emergency Use Authorization (EUA). This EUA will remain in effect (meaning this test can be used) for the duration of the COVID-19 declaration under Section 564(b)(1) of the Act, 21 U.S.C. section 360bbb-3(b)(1), unless the authorization is terminated or revoked.  Performed at [facility], [address] [place], [place] [address]   Lactate dehydrogenase (pleural or peritoneal fluid)     Status: Abnormal   Collection Time: 04/16/21  3:55 PM  Result Value Ref Range   LD, Fluid 417 (H) 3 - 23 U/L    Comment: (NOTE) Results should be evaluated in conjunction with serum values    Fluid Type-FLDH CYTO PLEU     Comment: Performed at [facility], [address] [place], [place] [address]  Body fluid cell  count with differential     Status: Abnormal   Collection Time: 04/16/21  3:55 PM  Result Value Ref Range   Fluid Type-FCT CYTO PLEU    Color, Fluid YELLOW YELLOW    Appearance, Fluid HAZY (A) CLEAR   Total Nucleated Cell Count, Fluid 3,932 cu mm   Neutrophil Count, Fluid 95 %   Lymphs, Fluid 2 %   Monocyte-Macrophage-Serous Fluid 2 %   Eos, Fluid 1 %   Other Cells, Fluid 0 %    Comment: Performed at [facility], [address] [place], [place] [address]  Albumin, pleural or peritoneal fluid      Status: None   Collection Time: 04/16/21  3:55 PM  Result Value Ref Range   Albumin, Fluid 2.7 g/dL    Comment: (NOTE) No normal range established for this test Results should be evaluated in conjunction with serum values    Fluid Type-FALB CYTO PLEU     Comment: Performed at [facility], [address] [place], [place] [address]  Protein, pleural or peritoneal fluid     Status: None   Collection Time: 04/16/21  3:55 PM  Result Value Ref Range   Total protein, fluid 6.0 g/dL    Comment: (NOTE) No normal range established for this test Results should be evaluated in conjunction with serum values    Fluid Type-FTP CYTO PLEU     Comment: Performed at [facility], [address] [place], [place] [address]  Glucose, pleural or peritoneal fluid     Status: None   Collection Time: 04/16/21  3:55 PM  Result Value Ref Range   Glucose, Fluid 34 mg/dL    Comment: (NOTE) No normal range established for this test Results should be evaluated in conjunction with serum values    Fluid Type-FGLU CYTO PLEU     Comment: Performed at [facility], [address] [place], [place] [address]  Body fluid culture w Gram Stain     Status: None (Preliminary result)   Collection Time: 04/16/21  3:55 PM   Specimen: PATH Cytology Pleural fluid  Result Value Ref Range   Specimen Description      PLEURAL Performed at [facility], [address], [place], [place] [address]    Special Requests      NONE Performed at [facility], [address] [place], [place] [address]    Gram Stain RARE WBC SEEN NO  ORGANISMS SEEN     Culture      NO GROWTH < 12 HOURS Performed at [facility], [address], [place], [place] [address]    Report Status PENDING   Basic metabolic panel     Status: Abnormal   Collection Time: 04/17/21  9:33 AM  Result Value Ref Range   Sodium 135 135 - 145 mmol/L   Potassium 4.7 3.5 - 5.1 mmol/L    Comment: HEMOLYSIS AT THIS LEVEL MAY AFFECT RESULT   Chloride 102 98 - 111 mmol/L   CO2 25 22 - 32 mmol/L   Glucose, Bld 103 (H) 70 - 99 mg/dL    Comment: Glucose reference range applies only to samples taken after fasting for at least 8 hours.   BUN 7 6 - 20 mg/dL   Creatinine, Ser 9.62 0.61 - 1.24 mg/dL   Calcium 8.3 (L) 8.9 - 10.3 mg/dL   GFR, Estimated >95 >28 mL/min    Comment: (NOTE) Calculated using the CKD-EPI Creatinine Equation (2021)  Anion gap 8 5 - 15    Comment: Performed at [facility], [address] [place], [place] [address]  CBC     Status: Abnormal   Collection Time: 04/17/21  9:33 AM  Result Value Ref Range   WBC 14.1 (H) 4.0 - 10.5 K/uL   RBC 4.08 (L) 4.22 - 5.81 MIL/uL   Hemoglobin 11.7 (L) 13.0 - 17.0 g/dL   HCT 47.8 (L) 29.5 - 62.1 %   MCV 85.8 80.0 - 100.0 fL   MCH 28.7 26.0 - 34.0 pg   MCHC 33.4 30.0 - 36.0 g/dL   RDW 30.8 65.7 - 84.6 %   Platelets 452 (H) 150 - 400 K/uL   nRBC 0.0 0.0 - 0.2 %    Comment: Performed at [facility], [address], [place], [place] [address]  MRSA Next Gen by PCR, Nasal     Status: Abnormal   Collection Time: 04/17/21  9:43 AM   Specimen: Nasal Mucosa; Nasal Swab  Result Value Ref Range   MRSA by PCR Next Gen DETECTED (A) NOT DETECTED    Comment: RESULT CALLED TO, READ BACK BY AND VERIFIED WITH: Sung Amabile, RN 04/17/21 1102 MW (NOTE) The GeneXpert MRSA Assay (FDA approved for NASAL specimens only), is one component of a comprehensive MRSA colonization surveillance program. It is not intended to diagnose MRSA infection nor to guide or monitor treatment for MRSA  infections. Test performance is not FDA approved in patients less than 66 years old. Performed at [facility], [address] [place], [place] [address]   Lactic acid, plasma     Status: None   Collection Time: 04/17/21  4:41 PM  Result Value Ref Range   Lactic Acid, Venous 0.9 0.5 - 1.9 mmol/L    Comment: Performed at [facility], [address] [place], [place] [address]    MICRO:  IMAGING: DG Chest 2 View  Result Date: 04/16/2021 CLINICAL DATA:  Left-sided chest pain. EXAM: CHEST - 2 VIEW COMPARISON:  July 13, 2015 FINDINGS: The heart size and mediastinal contours are partially obscured. Large left pleural effusion with adjacent airspace consolidation. The visualized skeletal structures are unremarkable. Metallic left nipple ornamentation. IMPRESSION: Large left pleural effusion with adjacent airspace consolidation. Electronically Signed   By: Maudry Mayhew M.D.   On: 04/16/2021 09:45   CT Angio Chest PE W and/or Wo Contrast  Result Date: 04/16/2021 CLINICAL DATA:  Pulmonary embolism (PE) suspected, high prob EXAM: CT ANGIOGRAPHY CHEST WITH CONTRAST TECHNIQUE: Multidetector CT imaging of the chest was performed using the standard protocol during bolus administration of intravenous contrast. Multiplanar CT image reconstructions and MIPs were obtained to evaluate the vascular anatomy. RADIATION DOSE REDUCTION: This exam was performed according to the departmental dose-optimization program which includes automated exposure control, adjustment of the mA and/or kV according to patient size and/or use of iterative reconstruction technique. CONTRAST:  75mL OMNIPAQUE IOHEXOL 350 MG/ML SOLN COMPARISON:  None. FINDINGS: Cardiovascular: Satisfactory opacification of the pulmonary arteries to the segmental level. No evidence of pulmonary embolism. Normal heart size. No pericardial effusion. Mediastinum/Nodes: No enlarged nodes. Thyroid and esophagus are unremarkable. Lungs/Pleura:  Large left pleural effusion. Collapse of the most of the left lower lobe and lingula. Right lung is clear. No pneumothorax. Upper Abdomen: No acute abnormality. Musculoskeletal: Age-indeterminate mild loss of height at the superior endplates of T2 and T3. Review of the MIP images confirms the above findings. IMPRESSION: Large left pleural effusion with significant lower lobe  and lingular collapse. Effusion may be partially loculated. Underlying pneumonia is not excluded. No evidence of pulmonary embolism. Age-indeterminate mild T2 and T3 compression fractures are probably chronic. Electronically Signed   By: Guadlupe Spanish M.D.   On: 04/16/2021 12:39   DG Chest Port 1 View  Result Date: 04/16/2021 CLINICAL DATA:  Left thoracentesis EXAM: PORTABLE CHEST 1 VIEW COMPARISON:  Radiograph 04/16/2021, CT 04/16/2021 FINDINGS: Decreased left pleural effusion, now small-moderate size, and adjacent atelectasis after thoracentesis. No visible pneumothorax. Right lung is clear. Unchanged cardiomediastinal silhouette. No acute osseous abnormality. IMPRESSION: Decreased left pleural effusion, now small-moderate size, and adjacent opacities after thoracentesis. No visible pneumothorax. Electronically Signed   By: Caprice Renshaw M.D.   On: 04/16/2021 16:23   US THORACENTESIS ASP PLEURAL SPACE W/IMG GUIDE  Result Date: 04/16/2021 INDICATION: Shortness of breath with chest pain and left-sided pleural effusion request received for diagnostic and therapeutic thoracentesis. EXAM: ULTRASOUND GUIDED LEFT THORACENTESIS MEDICATIONS: Local 1% lidocaine only. COMPLICATIONS: None immediate. PROCEDURE: An ultrasound guided thoracentesis was thoroughly discussed with the patient and questions answered. The benefits, risks, alternatives and complications were also discussed. The patient understands and wishes to proceed with the procedure. Written consent was obtained. Ultrasound was performed to localize and mark an adequate pocket of fluid in  the left chest, effusion was severely loculated. The area was then prepped and draped in the normal sterile fashion. 1% Lidocaine was used for local anesthesia. Under ultrasound guidance a 19 gauge, 7-cm, Yueh catheter was introduced. Thoracentesis was performed. Procedure was stopped early secondary to patient's complaints of pain with small remaining pleural effusion seen post procedure. The catheter was removed and a dressing applied. FINDINGS: A total of approximately 670 mL of clear yellow fluid was removed. Samples were sent to the laboratory as requested by the clinical team. IMPRESSION: Successful ultrasound guided left thoracentesis yielding 670 mL of pleural fluid. This exam was performed by Pattricia Boss PA-C, and was supervised and interpreted by Dr. Fredia Sorrow. Electronically Signed   By: Irish Lack M.D.   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  - recommend to place on airborne isolation - will add ada and mtb pcr - please do induce sputum for AFB culture -continue on linezolid and piptazo for the time being, until culture results  - hx of iv drug use = will check for hep C ab, hiv ab;continue on suboxone to minimize withdrawal.  Leukocytosis = improving

## 2021-04-18 ENCOUNTER — Encounter (HOSPITAL_COMMUNITY): Payer: Self-pay | Admitting: Internal Medicine

## 2021-04-18 ENCOUNTER — Inpatient Hospital Stay (HOSPITAL_COMMUNITY)
Admission: AD | Admit: 2021-04-18 | Discharge: 2021-04-24 | DRG: 177 | Disposition: A | Discharge status: Home / Self Care | Payer: Self-pay | Source: Other Acute Inpatient Hospital | Attending: Internal Medicine | Admitting: Internal Medicine

## 2021-04-18 DIAGNOSIS — J189 Pneumonia, unspecified organism: Secondary | ICD-10-CM | POA: Diagnosis present

## 2021-04-18 DIAGNOSIS — Z79899 Other long term (current) drug therapy: Secondary | ICD-10-CM

## 2021-04-18 DIAGNOSIS — E43 Unspecified severe protein-calorie malnutrition: Secondary | ICD-10-CM | POA: Insufficient documentation

## 2021-04-18 DIAGNOSIS — F1193 Opioid use, unspecified with withdrawal: Secondary | ICD-10-CM | POA: Diagnosis present

## 2021-04-18 DIAGNOSIS — F1910 Other psychoactive substance abuse, uncomplicated: Secondary | ICD-10-CM | POA: Diagnosis present

## 2021-04-18 DIAGNOSIS — Z681 Body mass index (BMI) 19 or less, adult: Secondary | ICD-10-CM

## 2021-04-18 DIAGNOSIS — Z22322 Carrier or suspected carrier of Methicillin resistant Staphylococcus aureus: Secondary | ICD-10-CM

## 2021-04-18 DIAGNOSIS — F1123 Opioid dependence with withdrawal: Secondary | ICD-10-CM | POA: Diagnosis present

## 2021-04-18 DIAGNOSIS — E871 Hypo-osmolality and hyponatremia: Secondary | ICD-10-CM | POA: Diagnosis present

## 2021-04-18 DIAGNOSIS — E46 Unspecified protein-calorie malnutrition: Secondary | ICD-10-CM

## 2021-04-18 DIAGNOSIS — B356 Tinea cruris: Secondary | ICD-10-CM | POA: Diagnosis present

## 2021-04-18 DIAGNOSIS — J869 Pyothorax without fistula: Principal | ICD-10-CM | POA: Diagnosis present

## 2021-04-18 DIAGNOSIS — F17210 Nicotine dependence, cigarettes, uncomplicated: Secondary | ICD-10-CM | POA: Diagnosis present

## 2021-04-18 DIAGNOSIS — Z9689 Presence of other specified functional implants: Secondary | ICD-10-CM

## 2021-04-18 DIAGNOSIS — Z881 Allergy status to other antibiotic agents status: Secondary | ICD-10-CM

## 2021-04-18 DIAGNOSIS — J9811 Atelectasis: Secondary | ICD-10-CM | POA: Diagnosis present

## 2021-04-18 DIAGNOSIS — J90 Pleural effusion, not elsewhere classified: Secondary | ICD-10-CM

## 2021-04-18 LAB — CBC
HCT: 33.4 % — ABNORMAL LOW (ref 39.0–52.0)
Hemoglobin: 11.3 g/dL — ABNORMAL LOW (ref 13.0–17.0)
MCH: 28.8 pg (ref 26.0–34.0)
MCHC: 33.8 g/dL (ref 30.0–36.0)
MCV: 85.2 fL (ref 80.0–100.0)
Platelets: 517 10*3/uL — ABNORMAL HIGH (ref 150–400)
RBC: 3.92 MIL/uL — ABNORMAL LOW (ref 4.22–5.81)
RDW: 15.2 % (ref 11.5–15.5)
WBC: 14.8 10*3/uL — ABNORMAL HIGH (ref 4.0–10.5)
nRBC: 0 % (ref 0.0–0.2)

## 2021-04-18 LAB — HIV ANTIBODY (ROUTINE TESTING W REFLEX): HIV Screen 4th Generation wRfx: NONREACTIVE

## 2021-04-18 LAB — CYTOLOGY - NON PAP

## 2021-04-18 MED ORDER — PIPERACILLIN-TAZOBACTAM 3.375 G IVPB
3.3750 g | Freq: Three times a day (TID) | INTRAVENOUS | Status: DC
Start: 2021-04-18 — End: 2021-04-19
  Administered 2021-04-18 – 2021-04-19 (×2): 3.375 g via INTRAVENOUS
  Filled 2021-04-18 (×3): qty 50

## 2021-04-18 MED ORDER — MELATONIN 5 MG PO TABS: 5.0000 mg | ORAL_TABLET | Freq: Every day | ORAL | 0 refills | Status: DC

## 2021-04-18 MED ORDER — ENOXAPARIN SODIUM 40 MG/0.4ML IJ SOSY: 40.0000 mg | PREFILLED_SYRINGE | INTRAMUSCULAR | Status: DC

## 2021-04-18 MED ORDER — LINEZOLID 600 MG/300ML IV SOLN
600.0000 mg | Freq: Two times a day (BID) | INTRAVENOUS | Status: DC
  Administered 2021-04-18 – 2021-04-23 (×10): 600 mg via INTRAVENOUS
  Filled 2021-04-18 (×10): qty 300

## 2021-04-18 MED ORDER — TRAZODONE HCL 50 MG PO TABS
50.0000 mg | ORAL_TABLET | Freq: Every evening | ORAL | Status: DC | PRN
  Administered 2021-04-18: 50 mg via ORAL
  Filled 2021-04-18: qty 1

## 2021-04-18 MED ORDER — LIDOCAINE 5 % EX PTCH
1.0000 | MEDICATED_PATCH | CUTANEOUS | Status: DC
  Administered 2021-04-19 – 2021-04-21 (×3): 1 via TRANSDERMAL
  Filled 2021-04-18 (×3): qty 1

## 2021-04-18 MED ORDER — MELATONIN 5 MG PO TABS: 5.0000 mg | ORAL_TABLET | Freq: Every day | ORAL | Status: DC

## 2021-04-18 MED ORDER — LINEZOLID 600 MG/300ML IV SOLN: 600.0000 mg | Freq: Two times a day (BID) | INTRAVENOUS | Status: DC

## 2021-04-18 MED ORDER — BUPRENORPHINE HCL-NALOXONE HCL 2-0.5 MG SL SUBL
1.0000 | SUBLINGUAL_TABLET | SUBLINGUAL | Status: AC | PRN
  Filled 2021-04-18: qty 1

## 2021-04-18 MED ORDER — LIDOCAINE 5 % EX PTCH: 1.0000 | MEDICATED_PATCH | CUTANEOUS | 0 refills | Status: DC

## 2021-04-18 MED ORDER — ACETAMINOPHEN 325 MG PO TABS: 650.0000 mg | ORAL_TABLET | Freq: Four times a day (QID) | ORAL | Status: DC | PRN

## 2021-04-18 MED ORDER — ACETAMINOPHEN 325 MG PO TABS
650.0000 mg | ORAL_TABLET | Freq: Four times a day (QID) | ORAL | Status: DC | PRN
  Administered 2021-04-19 – 2021-04-24 (×6): 650 mg via ORAL
  Filled 2021-04-18 (×6): qty 2

## 2021-04-18 MED ORDER — MELATONIN 5 MG PO TABS
5.0000 mg | ORAL_TABLET | Freq: Every day | ORAL | Status: DC
Start: 2021-04-18 — End: 2021-04-24
  Administered 2021-04-18 – 2021-04-23 (×6): 5 mg via ORAL
  Filled 2021-04-18 (×6): qty 1

## 2021-04-18 MED ORDER — BUPRENORPHINE HCL-NALOXONE HCL 8-2 MG SL SUBL
1.0000 | SUBLINGUAL_TABLET | Freq: Two times a day (BID) | SUBLINGUAL | Status: DC
  Administered 2021-04-18 (×2): 1 via SUBLINGUAL
  Filled 2021-04-18 (×2): qty 1

## 2021-04-18 MED ORDER — BUPRENORPHINE HCL-NALOXONE HCL 8-2 MG SL SUBL
1.0000 | SUBLINGUAL_TABLET | Freq: Two times a day (BID) | SUBLINGUAL | Status: DC
  Administered 2021-04-18 – 2021-04-24 (×12): 1 via SUBLINGUAL
  Filled 2021-04-18 (×12): qty 1

## 2021-04-18 MED ORDER — BUPRENORPHINE HCL-NALOXONE HCL 8-2 MG SL SUBL: 1.0000 | SUBLINGUAL_TABLET | Freq: Two times a day (BID) | SUBLINGUAL | Status: DC

## 2021-04-18 MED ORDER — PIPERACILLIN-TAZOBACTAM 3.375 G IVPB: 3.3750 g | Freq: Three times a day (TID) | INTRAVENOUS | Status: DC

## 2021-04-18 MED ORDER — ACETAMINOPHEN 650 MG RE SUPP: 650.0000 mg | Freq: Four times a day (QID) | RECTAL | Status: DC | PRN

## 2021-04-18 MED ORDER — SENNOSIDES-DOCUSATE SODIUM 8.6-50 MG PO TABS
1.0000 | ORAL_TABLET | Freq: Every evening | ORAL | Status: DC | PRN
  Administered 2021-04-19: 21:00:00 1 via ORAL
  Filled 2021-04-18: qty 1

## 2021-04-18 MED ORDER — TRAZODONE HCL 50 MG PO TABS: 50.0000 mg | ORAL_TABLET | Freq: Every evening | ORAL | Status: DC | PRN

## 2021-04-18 NOTE — Assessment & Plan Note (Signed)
Patient with a history of IV drug use. He has track marks on his arms and legs as well.  Admit daily use of heroin. ?He is being started on Suboxone protocol, it is helping with his withdrawal symptoms. ?-Continue with Suboxone ?-Symptomatic management for withdrawal symptoms ?

## 2021-04-18 NOTE — Progress Notes (Signed)
?Progress Note ? ? ?Patient: Lance Marquez CZY:[id number] DOB: 03-Jul-1995 DOA: 04/16/2021     2 ?DOS: the patient was seen and examined on 04/18/2021 ?  ?Brief hospital course: ?Taken from H&P. ? ? Lance Marquez is a 26 y.o. male with medical history significant for IV drug use, recent hospitalization for septic arthritis involving the right knee who presents to the ER via EMS for evaluation of left lateral chest wall pain for about 3 days.  He rates his pain a 10 x 10 in intensity at its worst and it is worse with inspiration.  He states that he has had  fever and chills even though was not documented and has a cough occasionally productive of yellow phlegm.  Denies any recent fall or trauma.  Admits to continued IV drug use with last use about 2 days prior.  Per patient he continued to use IV heroin daily. ? ?Chest x-ray showed a large left pleural effusion with adjacent consolidation. ?ER physician, Dr Fuller Plan consulted CT surgery at [facility] who recommended consulting interventional radiology for thoracentesis with pigtail drainage and then to consult pulmonary. ? ?He was also found to have significant leukocytosis which started improving.  There was mild hyponatremia with sodium of 129 noted, thought to be due to poor p.o. intake, resolved with IV fluid. ? ?Thoracentesis was done with removal of 650 mL of fluid, preliminary labs with exudative fluid and preliminary cultures remain negative.  Patient was already on Zyvox. ? ?1/4 blood cultures bottles with MRSE, most likely a contaminant.  Infectious disease was also consulted-we will appreciate their recommendations. ? ?Patient started getting opioid withdrawal symptoms, started him on Suboxone protocol. ?He was again extensively counseled.  Will remain high risk. ?He will need a prolonged hospitalization if IV antibiotics needed. ? ?3/8: Suboxone seems working.  Infectious diseases concern about TB and they ordered airborne precautions until cleared. ?HIV  screening negative, hep C pending, strep pneumo negative, pleural fluid culture remains negative.  Rest of the labs pending. ?He will continue current antibiotics at this time. ? ? ?Assessment and Plan: ?* Pleural effusion on left ?Patient presents for evaluation of left lateral chest wall pain associated with fever, chills and a productive cough. ?He has a large left pleural effusion with consolidation in the left lung ?Patient has a history of IV drug use and is at increased risk for infection with MRSA. He is allergic to vancomycin. ?Pleural fluid preliminary cultures remain negative, although consistent with exudative fluid. ?1/4 blood cultures with MRSE, most likely a contaminant. ?ID was consulted-will appreciate their input. ?Concern of TB-labs pending. ?-Sputum induction for AFB culture ordered. ?-Continue with Zosyn and Zyvox for now ? ?Lobar pneumonia (HCC) ?Patient at risk for MRSA infection ?Continue empiric antibiotic therapy with Zosyn  and Zyvox ? ?Drug abuse and dependence (HCC) ?Patient with a history of IV drug use. He has track marks on his arms and legs as well.  Admit daily use of heroin. ?He is being started on Suboxone protocol, it is helping with his withdrawal symptoms. ?-Continue with Suboxone ?-Symptomatic management for withdrawal symptoms ? ?Hyponatremia ?Resolved.  Most likely secondary to poor p.o. intake as it resolved with IV fluid. ?-Continue to monitor ? ?Unspecified protein-calorie malnutrition (HCC) ?Estimated body mass index is 19.2 kg/m? as calculated from the following: ?  Height as of this encounter: 5\' 9"  (1.753 m). ?  Weight as of this encounter: 59 kg.  ? ?-Dietitian consult ? ?Subjective: Patient was resting comfortably when  seen today.  Stating that Suboxone is working and he is able to tolerate his withdrawals.  Denies any nausea, vomiting or diarrhea.  No palpitations.  Having some periodic muscle spasms.  Unable to sleep well overnight, he wants to get some rest  now. ? ?Physical Exam: ?Vitals:  ? 04/18/21 0002 04/18/21 0402 04/18/21 0823 04/18/21 1200  ?BP:  113/67 125/72 120/70  ?Pulse: 96 89 93   ?Resp:  18 16 16   ?Temp:  98.4 ?F (36.9 ?C) 98.4 ?F (36.9 ?C) 98 ?F (36.7 ?C)  ?TempSrc:  Oral Oral Oral  ?SpO2: 100% 97% 98% 99%  ?Weight:      ?Height:      ? ?General.     In no acute distress. ?Pulmonary.  Lungs clear bilaterally, normal respiratory effort. ?CV.  Regular rate and rhythm, no JVD, rub or murmur. ?Abdomen.  Soft, nontender, nondistended, BS positive. ?CNS.  Alert and oriented x3.  No focal neurologic deficit. ?Extremities.  No edema, no cyanosis, pulses intact and symmetrical. ?Psychiatry.  Judgment and insight appears normal. ? ?Data Reviewed: ?Notes from the consultant and labs reviewed. ? ?Family Communication: Discussed with patient ? ?Disposition: ?Status is: Inpatient ?Remains inpatient appropriate because: Severity of illness ? ? Planned Discharge Destination: Home ? ?DVT prophylaxis.  Lovenox ? ?Time spent: 45 minutes ? ?This record has been created using Systems analyst. Errors have been sought and corrected,but may not always be located. Such creation errors do not reflect on the standard of care. ? ?Author: ?Lorella Nimrod, MD ?04/18/2021 1:22 PM ? ?For on call review [email or web address].  ?

## 2021-04-18 NOTE — H&P (Signed)
History and Physical    PLEASE NOTE THAT DRAGON DICTATION SOFTWARE WAS USED IN THE CONSTRUCTION OF THIS NOTE.   Lance Marquez [id number] DOB: [birth year] DOA: 04/18/2021  PCP: Pcp, No (will further assess) Patient coming from: [facility]  I have personally briefly reviewed patient's old medical records in [facility]  Reason for transfer: left pleural effusion  HPI: Lance Marquez is a 26 y.o. male with medical history significant for IV drug abuse, including IV heroin use, who is admitted to [facility] on 04/18/2021 by way of transfer from [facility] for further evaluation and management of left pleural effusion after presenting from home to [facility] ED on 04/16/2021 complaining of pleuritic left-sided chest discomfort.  The patient has been hospitalized at [facility] from 04/16/2021 until today's transfer to [facility] for left pleural effusion as well as suspected left lower lobe pneumonia after originally presenting to [facility] ED on 04/16/2021 complaining of 2 to 3 days of left lateral pleuritic chest discomfort associated with shortness of breath.  This was associated with subjective fever/chills as well as new onset mildly productive cough in the absence of any hemoptysis.    While at [facility], troponin x2 were nonelevated and EKG reportedly showed no evidence of acute ischemic changes.  Chest x-ray showed left pleural effusion as well as adjacent left lower lobe airspace opacities concerning for pneumonia with parapneumonic effusion.  CTA chest showed no evidence of acute pulmonary embolism, nor any evidence of interstitial or pulmonary edema.  He underwent diagnostic left lower thoracentesis via IR on 04/16/2021 associated with removal of 670 cc of pleural fluid.  Postprocedural chest x-ray showed interval decrease in size of left pleural effusion, while continuing to demonstrate adjacent left lower lobe airspace opacities, and showed no evidence of pneumothorax.  Pleural fluid analysis  was reportedly consistent with an exudative process, with associated pleural fluid culture showing no growth to date.  Cytology associated with pleural fluid analysis was negative for malignancy.  Evaluation of pleural fluid includes the following results that remain pending at this time: AFB culture, adenosine deaminase.   Additional infectious evaluation over the course of hospitalization at [facility] was notable for HIV screen negative, strep pneumonia negative, COVID-19/influenza PCR negative.  Legionella pending.  Hepatitis C pending.  MRSA PCR positive.  Blood cultures x2 drawn on the day of admission at [facility] demonstrated 1 out of 4 bottles positive for methicillin resistant Staph epidermidis.  Infectious disease was consulted, and conveys suspicion that this result was consistent with contaminant.  Per ID consult for left lower lobe pneumonia with pleural effusion, and in the setting of recent hospitalization in February 2023 for septic arthritis in the form of a septic right knee for which he received IV antibiotics, in the context of the patient's acknowledgment of regular IV drug abuse, the patient has been on Zosyn as well as Zyvox in the setting of a reported history of allergic reaction to vancomycin.   [facility] hospital course was also notable for reported evidence of symptoms consistent with opioid withdrawal, in the context of patient's report of most recent heroin use within 2 days of his presentation to [facility] ED on 04/16/2021.  Consequently, Suboxone protocol for opioid withdrawal was initiated.  He also had mild hyponatremia at presentation, with associated sodium noted to be 129.  This was felt to be on the basis of dehydration, including diminished oral intake, and improved to 135 via interval IV fluid administration.  In addition to the aforementioned infectious  disease consult, case was also discussed with pulmonology at [facility].  Ultimately, decision was made to transfer patient to [facility] for  further evaluation management of left pleural effusion, with concern for potential element of loculation.   Vital signs over the last 24 hours [facility] were notable for the following: Temperature max 98.4; heart rate 89-98; blood pressure 113/67 - 125/72; respiratory rate 16-19; oxygen saturation 97 to 100% on room air.  Most recent labs at [facility] were notable for the following: BMP on 04/17/2021: Sodium 135, bicarbonate 25, creatinine 0.64.  CBC on 04/18/2021 notable for the following: Blood cell count 14,800 compared to 14,100 on 04/17/2021.  Patient without any reported acute respiratory distress throughout hospital course at [facility], reportedly remained hemodynamically stable throughout that timeframe as well.  Upon arrival at [facility], the patient reports mild residual left lateral chest wall discomfort, limited to deep inspiration or coughing.  Denies any associated mopped assist.  Reports interval improvement in the subjective fever/chills with which she had originally presented to [facility] ED a few days ago.  Denies any significant residual shortness of breath.  Also denies any associated palpitations, diaphoresis, nausea, vomiting.  Denies any new onset rash, abdominal pain, diarrhea.     Review of Systems: As per HPI otherwise 10 point review of systems negative.   Past Medical History:  Diagnosis Date   Asthma    C. difficile diarrhea    Diarrhea    Drug abuse and dependence ([place])    Parasites in stool    Suicidal behavior     Past Surgical History:  Procedure Laterality Date   KNEE ARTHROSCOPY Right 03/22/2021   Procedure: ARTHROSCOPY KNEE;  Surgeon: Leim Fabry, MD;  Location: [facility] ORS;  Service: Orthopedics;  Laterality: Right;    Social History:  reports that he has been smoking cigarettes. He has never used smokeless tobacco. He reports current alcohol use. He reports current drug use. Drugs: IV and Methamphetamines.   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 No pertinent family history.   Prior to Admission medications   Medication Sig Start Date End Date Taking? Authorizing Provider  acetaminophen (TYLENOL) 325 MG tablet Take 2 tablets (650 mg total) by mouth every 6 (six) hours as needed for mild pain, headache or fever. 04/18/21   Lorella Nimrod, MD  buprenorphine-naloxone (SUBOXONE) 8-2 mg SUBL SL tablet Place 1 tablet under the tongue 2 (two) times daily. 04/18/21   Lorella Nimrod, MD  enoxaparin (LOVENOX) 40 MG/0.4ML injection Inject 0.4 mLs (40 mg total) into the skin daily. 04/18/21   Lorella Nimrod, MD  lidocaine (LIDODERM) 5 % Place 1 patch onto the skin daily. Remove & Discard patch within 12 hours or as directed by MD 04/19/21   Lorella Nimrod, MD  linezolid (ZYVOX) 600 MG/300ML IVPB Inject 300 mLs (600 mg total) into the vein every 12 (twelve) hours. 04/18/21   Lorella Nimrod, MD  melatonin 5 MG TABS Take 1 tablet (5 mg total) by mouth at bedtime. 04/18/21   Lorella Nimrod, MD  piperacillin-tazobactam (ZOSYN) 3.375 GM/50ML IVPB Inject 50 mLs (3.375 g total) into the vein every 8 (eight) hours. 04/18/21   Lorella Nimrod, MD  senna-docusate (SENOKOT-S) 8.6-50 MG tablet Take 1 tablet by mouth at bedtime as needed for mild constipation. 03/24/21   Aline August, MD  traZODone (DESYREL) 50 MG tablet Take 1 tablet (50 mg total) by mouth at bedtime  as needed for sleep. 04/18/21   Lorella Nimrod, MD     Objective    Physical Exam: Vitals:   04/18/21 1854  Pulse: 97  Resp: 19  Temp: 98.3 F (36.8 C)  TempSrc: Oral    General: appears to be stated age; alert, oriented Skin: warm, dry, no rash Head:  AT/[place] Mouth:  Oral mucosa membranes appear moist, normal dentition Neck: supple; trachea midline Heart:  RRR; did not appreciate any M/R/G Lungs: Slightly diminished left basilar breath sounds, but otherwise CTAB; did not appreciate any wheezes, rales, or rhonchi Abdomen: + BS; soft, ND, NT Vascular: 2+  pedal pulses b/l; 2+ radial pulses b/l Extremities: no peripheral edema, no muscle wasting Neuro: strength and sensation intact in upper and lower extremities b/l     Labs on Admission: I have personally reviewed following labs and imaging studies  CBC: Recent Labs  Lab 04/16/21 1113 04/17/21 0933 04/18/21 0943  WBC 21.5* 14.1* 14.8*  HGB 11.9* 11.7* 11.3*  HCT 36.1* 35.0* 33.4*  MCV 87.2 85.8 85.2  PLT 404* 452* [id number]*   Basic Metabolic Panel: Recent Labs  Lab 04/16/21 1113 04/17/21 0933  NA 129* 135  K 3.7 4.7  CL 97* 102  CO2 25 25  GLUCOSE 98 103*  BUN 9 7  CREATININE 0.58* 0.64  CALCIUM 8.5* 8.3*   GFR: Estimated Creatinine Clearance: 117.8 mL/min (by C-G formula based on SCr of 0.64 mg/dL). Liver Function Tests: Recent Labs  Lab 04/16/21 1113  AST 25  ALT 27  ALKPHOS 116  BILITOT 1.0  PROT 7.9  ALBUMIN 2.9*   No results for input(s): LIPASE, AMYLASE in the last 168 hours. No results for input(s): AMMONIA in the last 168 hours. Coagulation Profile: No results for input(s): INR, PROTIME in the last 168 hours. Cardiac Enzymes: No results for input(s): CKTOTAL, CKMB, CKMBINDEX, TROPONINI in the last 168 hours. BNP (last 3 results) No results for input(s): PROBNP in the last 8760 hours. HbA1C: No results for input(s): HGBA1C in the last 72 hours. CBG: No results for input(s): GLUCAP in the last 168 hours. Lipid Profile: No results for input(s): CHOL, HDL, LDLCALC, TRIG, CHOLHDL, LDLDIRECT in the last 72 hours. Thyroid Function Tests: No results for input(s): TSH, T4TOTAL, FREET4, T3FREE, THYROIDAB in the last 72 hours. Anemia Panel: No results for input(s): VITAMINB12, FOLATE, FERRITIN, TIBC, IRON, RETICCTPCT in the last 72 hours. Urine analysis:    Component Value Date/Time   COLORURINE STRAW (A) 03/22/2021 1543   APPEARANCEUR CLEAR (A) 03/22/2021 1543   APPEARANCEUR Cloudy 08/17/2013 0359   LABSPEC 1.006 03/22/2021 1543   LABSPEC 1.021  08/17/2013 0359   PHURINE 7.0 03/22/2021 1543   GLUCOSEU NEGATIVE 03/22/2021 1543   GLUCOSEU Negative 08/17/2013 0359   HGBUR NEGATIVE 03/22/2021 1543   BILIRUBINUR NEGATIVE 03/22/2021 1543   BILIRUBINUR Negative 08/17/2013 0359   KETONESUR NEGATIVE 03/22/2021 1543   PROTEINUR NEGATIVE 03/22/2021 1543   UROBILINOGEN 1.0 06/22/2009 0657   NITRITE NEGATIVE 03/22/2021 1543   LEUKOCYTESUR TRACE (A) 03/22/2021 1543   LEUKOCYTESUR Negative 08/17/2013 0359    Radiological Exams on Admission: No results found.    Assessment/Plan   Principal Problem:   Pleural effusion on left Active Problems:   Hyponatremia   Unspecified protein-calorie malnutrition (HCC)   Left lower lobe pneumonia   Opioid withdrawal ([place])       #) Left pleural effusion: Identified on initial imaging performed at [facility] ED on 04/16/2021.  Status post diagnostic thoracentesis on 04/16/2021, with  ensuing pleural fluid analysis reportedly consistent with exudative process, with postprocedural chest x-ray showing interval decrease in size of left pleural effusion, without any evidence of pneumothorax.  Suspected to represent parapneumonic effusion, given adjacent airspace opacities consistent with pneumonia.  Pleural fluid cultures demonstrate no growth to date.  Cytology negative for malignancy.  AFB/ADA results remain pending at this time.  HIV negative.  COVID-negative, flu negative.  There were some concerns at [facility] for an element of loculation associate this pleural effusion, prompting ultimate transfer to [facility] for further evaluation and management thereof.  Most recent chest x-ray occurred on 04/16/2021. Will repeat CXR to evaluate interval trend and to help guide potential consultation of pulmonology vs CT surg.   Plan: Repeat chest x-ray.  Repeat CBC with differential in the morning.  Continue existing Zosyn and Zyvox for suspected parapneumonic etiology.  Follow-up result of pleural effusion cultures as well as  ADA/AFP.  Check procalcitonin.        #) Left lower lobe pneumonia: Imaging at [facility] on 04/16/2021 demonstrated left lower lobe airspace opacities adjacent to aforementioned left pleural effusion, concerning for pneumonia with parapneumonic effusion.  As the patient had been hospitalized 1 month prior for septic arthritis at which time he received IV antibiotics, he is at increased risk for drug-resistant bacteria.  Additionally, in the setting of his history of IV drug abuse, he is also increased risk for MRSA.  Consequently, and in the setting of history of allergy to vancomycin, infectious disease was consulted at [facility], with ensuing recommendations for Zosyn as well as Zyvox, which the patient was receiving throughout his course at [facility].  Of note, and in tandem with infectious disease consultation, presenting blood cultures x2 were notable for 1 out of 4 bottles positive for methicillin resistant Staph epidermidis, which was felt to represent a contaminant.  Pneumonia work-up has also been notable for strep pneumonia urine antigen negative.  MRSA PCR positive, pending further continuation of Zyvox.  COVID-19/influenza negative.  Legionella urine antigen pending.   Plan: Continue Zosyn and Zyvox, as above.  Repeat chest x-ray in the morning, as above.  Check procalcitonin.  Follow-up results of Legionella urine antigen.        #) Protein calorie malnutrition: Presenting BMI 19.2.  Potential contributions given the patient's report of ongoing IV drug abuse with related suboptimal nutrition.  Of note, HIV negative.  DVT testing in the form of pleural fluid ADA/AFB, results currently pending, as further detailed above.  Pleural fluid cytology negative for malignancy.  Plan: I placed a consult with the dietitian to assist with recommendations for optimization of the patient's nutritional status, including recommendations regarding nutritional supplements.       #) Acute hypoosmolar  hyponatremia: Initial low serum sodium level at [facility] noted to be 129, exceptionally improved to 135 following interval IV fluids, conveying suspicion for element of dehydration in the setting of recent decline in oral intake.  Initial differential also includes the possibility of SIADH given multiple pulmonary pathologies identified on initial imaging.   Plan: Monitor strict I's and O's Daily weights.  CMP in the morning.      #) Opioid withdrawal: Reported evidence of opioid withdrawal at [facility] in the setting of patient's acknowledgment of regular use of IV heroin, prompting initiation of Suboxone protocol for opioid withdrawal at [facility]. Will continue this protocol.   Plan: Continue Suboxone protocol, as above.  Consult placed with transition of care team in the setting of reported IV heroin abuse.  DVT prophylaxis: SCD's   Code Status: Full code Family Communication: none Disposition Plan: Per Rounding Team Consults called: none;  Admission status: inpatient; med-tele   PLEASE NOTE THAT DRAGON DICTATION SOFTWARE WAS USED IN THE CONSTRUCTION OF THIS NOTE.   [place] DO [facility]  From [place]   04/18/2021, 8:26 PM

## 2021-04-18 NOTE — Progress Notes (Signed)
?  Chaplain On-Call attempted to visit with the patient at 41. ? ?Patient stated that he was "up all night", and that he would prefer a later visit. ? ?Chaplain will refer to the next On-Call Chaplain for follow up. ? ?Chaplain Evelena Peat ?M.Div., [facility] ?

## 2021-04-18 NOTE — Progress Notes (Signed)
Patient was complaining of muscle spasms, chills, and anxiety.  Suboxone was given and he is now resting.  Will continue to monitor.  Lance Marquez  04/18/2021  6:02 AM ? ? ? ?

## 2021-04-18 NOTE — Assessment & Plan Note (Signed)
Patient at risk for MRSA infection ?Continue empiric antibiotic therapy with Zosyn  and Zyvox ?

## 2021-04-18 NOTE — Assessment & Plan Note (Signed)
Estimated body mass index is 19.2 kg/m? as calculated from the following: ?  Height as of this encounter: 5\' 9"  (1.753 m). ?  Weight as of this encounter: 59 kg.  ? ?-Dietitian consult ?

## 2021-04-18 NOTE — Progress Notes (Incomplete)
Assessment and Plan: °No notes have been filed under this hospital service. °Service: Hospitalist ° °  °

## 2021-04-18 NOTE — Discharge Summary (Signed)
Physician Discharge Summary   Patient: Lance Marquez MRN: [id number] DOB: February 14, 1995  Admit date:     04/16/2021  Discharge date: 04/18/21  Discharge Physician: Arnetha Courser   PCP: Pcp, No   Recommendations at discharge:  Patient is being transferred to [facility] for concern of loculated effusion. Need cardiothoracic surgery evaluation. Infectious diseases involved. Continue current antibiotics. Patient was also started on Suboxone protocol for opioid withdrawal and detox  Discharge Diagnoses: Principal Problem:   Pleural effusion on left Active Problems:   Lobar pneumonia (HCC)   Drug abuse and dependence (HCC)   Hyponatremia   Unspecified protein-calorie malnutrition (HCC)  Resolved Problems:   * No resolved hospital problems. [facility] Course: Taken from H&P.   Josean Lycan is a 26 y.o. male with medical history significant for IV drug use, recent hospitalization for septic arthritis involving the right knee who presents to the ER via EMS for evaluation of left lateral chest wall pain for about 3 days.  He rates his pain a 10 x 10 in intensity at its worst and it is worse with inspiration.  He states that he has had  fever and chills even though was not documented and has a cough occasionally productive of yellow phlegm.  Denies any recent fall or trauma.  Admits to continued IV drug use with last use about 2 days prior.  Per patient he continued to use IV heroin daily.  Chest x-ray showed a large left pleural effusion with adjacent consolidation. ER physician, Dr Fuller Plan consulted CT surgery at [facility] who recommended consulting interventional radiology for thoracentesis with pigtail drainage and then to consult pulmonary.  He was also found to have significant leukocytosis which started improving.  There was mild hyponatremia with sodium of 129 noted, thought to be due to poor p.o. intake, resolved with IV fluid.  Thoracentesis was done with removal of 650 mL of  fluid, preliminary labs with exudative fluid and preliminary cultures remain negative.  Patient was already on Zyvox.  1/4 blood cultures bottles with MRSE, most likely a contaminant.  Infectious disease was also consulted-we will appreciate their recommendations.  Patient started getting opioid withdrawal symptoms, started him on Suboxone protocol. He was again extensively counseled.  Will remain high risk. He will need a prolonged hospitalization if IV antibiotics needed.  3/8: Suboxone seems working.  Infectious diseases concern about TB and they ordered airborne precautions until cleared. HIV screening negative, hep C pending, strep pneumo negative, pleural fluid culture remains negative.  Rest of the labs pending. He will continue current antibiotics at this time.  ID discussed with pulmonology and they are concerned about a trapped lung and loculated effusion. They advised patient to be transferred to [facility] for further management. Message also sent to cardiothoracic surgery to evaluate once patient reaches their. We will continue current management at this time.  Assessment and Plan: * Pleural effusion on left Patient presents for evaluation of left lateral chest wall pain associated with fever, chills and a productive cough. He has a large left pleural effusion with consolidation in the left lung Patient has a history of IV drug use and is at increased risk for infection with MRSA. He is allergic to vancomycin. Pleural fluid preliminary cultures remain negative, although consistent with exudative fluid. 1/4 blood cultures with MRSE, most likely a contaminant. ID was consulted-will appreciate their input. Concern of TB-labs pending. -Sputum induction for AFB culture ordered. -Continue with Zosyn and Zyvox for now  Lobar  pneumonia [facility]) Patient at risk for MRSA infection Continue empiric antibiotic therapy with Zosyn  and Zyvox  Drug abuse and dependence [facility]) Patient with  a history of IV drug use. He has track marks on his arms and legs as well.  Admit daily use of heroin. He is being started on Suboxone protocol, it is helping with his withdrawal symptoms. -Continue with Suboxone -Symptomatic management for withdrawal symptoms  Hyponatremia Resolved.  Most likely secondary to poor p.o. intake as it resolved with IV fluid. -Continue to monitor  Unspecified protein-calorie malnutrition (HCC) Estimated body mass index is 19.2 kg/m as calculated from the following:   Height as of this encounter: 5\' 9"  (1.753 m).   Weight as of this encounter: 59 kg.   -Dietitian consult   Pain control - [place] Controlled Substance Reporting System database was reviewed. and patient was instructed, not to drive, operate heavy machinery, perform activities at heights, swimming or participation in water activities or provide baby-sitting services while on Pain, Sleep and Anxiety Medications; until their outpatient Physician has advised to do so again. Also recommended to not to take more than prescribed Pain, Sleep and Anxiety Medications.   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   buprenorphine-naloxone 8-2 mg Subl SL tablet Commonly known as: SUBOXONE Place 1 tablet under the tongue 2 (two) times daily.   enoxaparin 40 MG/0.4ML injection Commonly known as: LOVENOX Inject 0.4 mLs (40 mg total) into the  skin daily.   lidocaine 5 % Commonly known as: LIDODERM Place 1 patch onto the skin daily. Remove & Discard patch within 12 hours or as directed by MD Start taking on: April 19, 2021   linezolid 600 MG/300ML IVPB Commonly known as: ZYVOX Inject 300 mLs (600 mg total) into the vein every 12 (twelve) hours.   melatonin 5 MG Tabs Take 1 tablet (5 mg total) by mouth at bedtime.   piperacillin-tazobactam 3.375 GM/50ML IVPB Commonly known as: ZOSYN Inject 50 mLs (3.375 g total) into the vein every 8 (eight) hours.   senna-docusate 8.6-50 MG tablet Commonly known as: Senokot-S Take 1 tablet by mouth at bedtime as needed for mild constipation.   traZODone 50 MG tablet Commonly known as: DESYREL Take 1 tablet (50 mg total) by mouth at bedtime as needed for sleep.        Discharge Exam: Filed Weights   04/16/21 0915  Weight: 59 kg   General.     In no acute distress. Pulmonary.  Lungs clear bilaterally, normal respiratory effort. CV.  Regular rate and rhythm, no JVD, rub or murmur. Abdomen.  Soft, nontender, nondistended, BS positive. CNS.  Alert and oriented .  No focal neurologic deficit. Extremities.  No edema, no cyanosis, pulses intact and symmetrical. Psychiatry.  Judgment and insight appears normal.   Condition at discharge: stable  The results of significant diagnostics from this hospitalization (including imaging,  microbiology, ancillary and laboratory) are listed below for reference.   Imaging Studies: DG Chest 2 View  Result Date: 04/16/2021 CLINICAL DATA:  Left-sided chest pain. EXAM: CHEST - 2 VIEW COMPARISON:  July 13, 2015 FINDINGS: The heart size and mediastinal contours are partially obscured. Large left pleural effusion with adjacent airspace consolidation. The visualized skeletal structures are unremarkable. Metallic left nipple ornamentation. IMPRESSION: Large left pleural effusion with adjacent airspace consolidation. Electronically Signed   By: Maudry MayhewJeffrey   Waltz M.D.   On: 04/16/2021 09:45   CT Angio Chest PE W and/or Wo Contrast  Result Date: 04/16/2021 CLINICAL DATA:  Pulmonary embolism (PE) suspected, high prob EXAM: CT ANGIOGRAPHY CHEST WITH CONTRAST TECHNIQUE: Multidetector CT imaging of the chest was performed using the standard protocol during bolus administration of intravenous contrast. Multiplanar CT image reconstructions and MIPs were obtained to evaluate the vascular anatomy. RADIATION DOSE REDUCTION: This exam was performed according to the departmental dose-optimization program which includes automated exposure control, adjustment of the mA and/or kV according to patient size and/or use of iterative reconstruction technique. CONTRAST:  75mL OMNIPAQUE IOHEXOL 350 MG/ML SOLN COMPARISON:  None. FINDINGS: Cardiovascular: Satisfactory opacification of the pulmonary arteries to the segmental level. No evidence of pulmonary embolism. Normal heart size. No pericardial effusion. Mediastinum/Nodes: No enlarged nodes. Thyroid and esophagus are unremarkable. Lungs/Pleura: Large left pleural effusion. Collapse of the most of the left lower lobe and lingula. Right lung is clear. No pneumothorax. Upper Abdomen: No acute abnormality. Musculoskeletal: Age-indeterminate mild loss of height at the superior endplates of T2 and T3. Review of the MIP images confirms the above findings. IMPRESSION: Large left pleural effusion with significant lower lobe and lingular collapse. Effusion may be partially loculated. Underlying pneumonia is not excluded. No evidence of pulmonary embolism. Age-indeterminate mild T2 and T3 compression fractures are probably chronic. Electronically Signed   By: Guadlupe SpanishPraneil  Patel M.D.   On: 04/16/2021 12:39   DG Chest Port 1 View  Result Date: 04/16/2021 CLINICAL DATA:  Left thoracentesis EXAM: PORTABLE CHEST 1 VIEW COMPARISON:  Radiograph 04/16/2021, CT 04/16/2021 FINDINGS: Decreased left pleural effusion, now small-moderate size, and adjacent  atelectasis after thoracentesis. No visible pneumothorax. Right lung is clear. Unchanged cardiomediastinal silhouette. No acute osseous abnormality. IMPRESSION: Decreased left pleural effusion, now small-moderate size, and adjacent opacities after thoracentesis. No visible pneumothorax. Electronically Signed   By: Caprice RenshawJacob  Kahn M.D.   On: 04/16/2021 16:23   DG Knee Complete 4 Views Right  Result Date: 03/22/2021 CLINICAL DATA:  Septic knee. EXAM: RIGHT KNEE - COMPLETE 4+ VIEW COMPARISON:  None. FINDINGS: No evidence of fracture, dislocation, or joint effusion. No evidence of arthropathy or other focal bone abnormality. Soft tissues are unremarkable. IMPRESSION: Negative. Electronically Signed   By: Lupita RaiderJames  Green Jr M.D.   On: 03/22/2021 16:11   US THORACENTESIS ASP PLEURAL SPACE W/IMG GUIDE  Result Date: 04/16/2021 INDICATION: Shortness of breath with chest pain and left-sided pleural effusion request received for diagnostic and therapeutic thoracentesis. EXAM: ULTRASOUND GUIDED LEFT THORACENTESIS MEDICATIONS: Local 1% lidocaine only. COMPLICATIONS: None immediate. PROCEDURE: An ultrasound guided thoracentesis was thoroughly discussed with the patient and questions answered. The benefits, risks, alternatives and complications were also discussed. The patient understands and wishes to proceed with the procedure. Written consent was obtained. Ultrasound was performed to localize and mark an adequate pocket of fluid in the left chest, effusion was severely loculated. The area was then prepped and draped in the normal sterile fashion. 1% Lidocaine was used for local anesthesia. Under  ultrasound guidance a 19 gauge, 7-cm, Yueh catheter was introduced. Thoracentesis was performed. Procedure was stopped early secondary to patient's complaints of pain with small remaining pleural effusion seen post procedure. The catheter was removed and a dressing applied. FINDINGS: A total of approximately 670 mL of clear yellow fluid  was removed. Samples were sent to the laboratory as requested by the clinical team. IMPRESSION: Successful ultrasound guided left thoracentesis yielding 670 mL of pleural fluid. This exam was performed by Pattricia Boss PA-C, and was supervised and interpreted by Dr. Fredia Sorrow. Electronically Signed   By: Irish Lack M.D.   On: 04/16/2021 16:23    Microbiology: Results for orders placed or performed during the hospital encounter of 04/16/21  Blood culture (routine x 2)     Status: Abnormal (Preliminary result)   Collection Time: 04/16/21  1:35 PM   Specimen: BLOOD  Result Value Ref Range Status   Specimen Description   Final    BLOOD BLOOD LEFT FOREARM Performed at [facility], [address], [place], [place] [address]    Special Requests   Final    BOTTLES DRAWN AEROBIC AND ANAEROBIC Blood Culture adequate volume Performed at [facility], [address], [place], [place] [address]    Culture  Setup Time   Final    GRAM POSITIVE COCCI ANAEROBIC BOTTLE ONLY CRITICAL RESULT CALLED TO, READ BACK BY AND VERIFIED WITH: DEVAN MITCHELL 04/17/21 1442 MW GRAM STAIN REVIEWED-AGREE WITH RESULT    Culture (A)  Final    STAPHYLOCOCCUS EPIDERMIDIS THE SIGNIFICANCE OF ISOLATING THIS ORGANISM FROM A SINGLE SET OF BLOOD CULTURES WHEN MULTIPLE SETS ARE DRAWN IS UNCERTAIN. PLEASE NOTIFY THE MICROBIOLOGY DEPARTMENT WITHIN ONE WEEK IF SPECIATION AND SENSITIVITIES ARE REQUIRED. Performed at [facility], [address], [place], [place] [address]    Report Status PENDING  Incomplete  Blood Culture ID Panel (Reflexed)     Status: Abnormal   Collection Time: 04/16/21  1:35 PM  Result Value Ref Range Status   Enterococcus faecalis NOT DETECTED NOT DETECTED Final   Enterococcus Faecium NOT DETECTED NOT DETECTED Final   Listeria monocytogenes NOT DETECTED NOT DETECTED Final   Staphylococcus species DETECTED (A) NOT DETECTED Final    Comment: CRITICAL RESULT CALLED TO, READ BACK  BY AND VERIFIED WITH: DEVAN MITCHELL 04/17/21 1143 MW    Staphylococcus aureus (BCID) NOT DETECTED NOT DETECTED Final   Staphylococcus epidermidis DETECTED (A) NOT DETECTED Final    Comment: Methicillin (oxacillin) resistant coagulase negative staphylococcus. Possible blood culture contaminant (unless isolated from more than one blood culture draw or clinical case suggests pathogenicity). No antibiotic treatment is indicated for blood  culture contaminants. CRITICAL RESULT CALLED TO, READ BACK BY AND VERIFIED WITH: [facility] MITCHELL 04/17/21 1143 MW    Staphylococcus lugdunensis NOT DETECTED NOT DETECTED Final   Streptococcus species NOT DETECTED NOT DETECTED Final   Streptococcus agalactiae NOT DETECTED NOT DETECTED Final   Streptococcus pneumoniae NOT DETECTED NOT DETECTED Final   Streptococcus pyogenes NOT DETECTED NOT DETECTED Final   A.calcoaceticus-baumannii NOT DETECTED NOT DETECTED Final   Bacteroides fragilis NOT DETECTED NOT DETECTED Final   Enterobacterales NOT DETECTED NOT DETECTED Final   Enterobacter cloacae complex NOT DETECTED NOT DETECTED Final   Escherichia coli NOT DETECTED NOT DETECTED Final   Klebsiella aerogenes NOT DETECTED NOT DETECTED Final   Klebsiella oxytoca NOT DETECTED NOT DETECTED Final   Klebsiella pneumoniae NOT DETECTED NOT DETECTED Final   Proteus species NOT DETECTED NOT DETECTED Final  Salmonella species NOT DETECTED NOT DETECTED Final   Serratia marcescens NOT DETECTED NOT DETECTED Final   Haemophilus influenzae NOT DETECTED NOT DETECTED Final   Neisseria meningitidis NOT DETECTED NOT DETECTED Final   Pseudomonas aeruginosa NOT DETECTED NOT DETECTED Final   Stenotrophomonas maltophilia NOT DETECTED NOT DETECTED Final   Candida albicans NOT DETECTED NOT DETECTED Final   Candida auris NOT DETECTED NOT DETECTED Final   Candida glabrata NOT DETECTED NOT DETECTED Final   Candida krusei NOT DETECTED NOT DETECTED Final   Candida parapsilosis NOT DETECTED NOT  DETECTED Final   Candida tropicalis NOT DETECTED NOT DETECTED Final   Cryptococcus neoformans/gattii NOT DETECTED NOT DETECTED Final   Methicillin resistance mecA/C DETECTED (A) NOT DETECTED Final    Comment: CRITICAL RESULT CALLED TO, READ BACK BY AND VERIFIED WITH: Drusilla Kanner 04/17/21 1143 MW Performed at [facility], [address] [place], [place] [address]   Blood culture (routine x 2)     Status: None (Preliminary result)   Collection Time: 04/16/21  1:42 PM   Specimen: BLOOD  Result Value Ref Range Status   Specimen Description BLOOD BLOOD RIGHT HAND  Final   Special Requests   Final    BOTTLES DRAWN AEROBIC AND ANAEROBIC Blood Culture adequate volume   Culture   Final    NO GROWTH 2 DAYS Performed at [facility], [address], [place], [place] [address]    Report Status PENDING  Incomplete  Resp Panel by RT-PCR (Flu A&B, Covid) Nasopharyngeal Swab     Status: None   Collection Time: 04/16/21  2:05 PM   Specimen: Nasopharyngeal Swab; Nasopharyngeal(NP) swabs in vial transport medium  Result Value Ref Range Status   SARS Coronavirus 2 by RT PCR NEGATIVE NEGATIVE Final    Comment: (NOTE) SARS-CoV-2 target nucleic acids are NOT DETECTED.  The SARS-CoV-2 RNA is generally detectable in upper respiratory specimens during the acute phase of infection. The lowest concentration of SARS-CoV-2 viral copies this assay can detect is 138 copies/mL. A negative result does not preclude SARS-Cov-2 infection and should not be used as the sole basis for treatment or other patient management decisions. A negative result may occur with  improper specimen collection/handling, submission of specimen other than nasopharyngeal swab, presence of viral mutation(s) within the areas targeted by this assay, and inadequate number of viral copies(<138 copies/mL). A negative result must be combined with clinical observations, patient history, and epidemiological information.  The expected result is Negative.  Fact Sheet for Patients:  BloggerCourse.com  Fact Sheet for Healthcare Providers:  SeriousBroker.it  This test is no t yet approved or cleared by the [place] FDA and  has been authorized for detection and/or diagnosis of SARS-CoV-2 by FDA under an Emergency Use Authorization (EUA). This EUA will remain  in effect (meaning this test can be used) for the duration of the COVID-19 declaration under Section 564(b)(1) of the Act, 21 U.S.C.section 360bbb-3(b)(1), unless the authorization is terminated  or revoked sooner.       Influenza A by PCR NEGATIVE NEGATIVE Final   Influenza B by PCR NEGATIVE NEGATIVE Final    Comment: (NOTE) The Xpert Xpress SARS-CoV-2/FLU/RSV plus assay is intended as an aid in the diagnosis of influenza from Nasopharyngeal swab specimens and should not be used as a sole basis for treatment. Nasal washings and aspirates are unacceptable for Xpert Xpress SARS-CoV-2/FLU/RSV testing.  Fact Sheet for Patients: BloggerCourse.com  Fact Sheet for Healthcare Providers: SeriousBroker.it  This test is not  yet approved or cleared by the [place] and has been authorized for detection and/or diagnosis of SARS-CoV-2 by FDA under an Emergency Use Authorization (EUA). This EUA will remain in effect (meaning this test can be used) for the duration of the COVID-19 declaration under Section 564(b)(1) of the Act, 21 U.S.C. section 360bbb-3(b)(1), unless the authorization is terminated or revoked.  Performed at [facility], [address] [place], [place] [address]   Body fluid culture w Gram Stain     Status: None (Preliminary result)   Collection Time: 04/16/21  3:55 PM   Specimen: PATH Cytology Pleural fluid  Result Value Ref Range Status   Specimen Description   Final    PLEURAL Performed at [facility], [address], [place], [place] [address]    Special Requests   Final    NONE Performed at [facility], [address] [place], [place] [address]    Gram Stain RARE WBC SEEN NO ORGANISMS SEEN   Final   Culture   Final    NO GROWTH 2 DAYS Performed at [facility], [address], [place], [place] [address]    Report Status PENDING  Incomplete  MRSA Next Gen by PCR, Nasal     Status: Abnormal   Collection Time: 04/17/21  9:43 AM   Specimen: Nasal Mucosa; Nasal Swab  Result Value Ref Range Status   MRSA by PCR Next Gen DETECTED (A) NOT DETECTED Final    Comment: RESULT CALLED TO, READ BACK BY AND VERIFIED WITH: Sung Amabile, RN 04/17/21 1102 MW (NOTE) The GeneXpert MRSA Assay (FDA approved for NASAL specimens only), is one component of a comprehensive MRSA colonization surveillance program. It is not intended to diagnose MRSA infection nor to guide or monitor treatment for MRSA infections. Test performance is not FDA approved in patients less than 71 years old. Performed at [facility], [address] [place], [place] [address]     Labs: CBC: Recent Labs  Lab 04/16/21 1113 04/17/21 0933 04/18/21 0943  WBC 21.5* 14.1* 14.8*  HGB 11.9* 11.7* 11.3*  HCT 36.1* 35.0* 33.4*  MCV 87.2 85.8 85.2  PLT 404* 452* 517*   Basic Metabolic Panel: Recent Labs  Lab 04/16/21 1113 04/17/21 0933  NA 129* 135  K 3.7 4.7  CL 97* 102  CO2 25 25  GLUCOSE 98 103*  BUN 9 7  CREATININE 0.58* 0.64  CALCIUM 8.5* 8.3*   Liver Function Tests: Recent Labs  Lab 04/16/21 1113  AST 25  ALT 27  ALKPHOS 116  BILITOT 1.0  PROT 7.9  ALBUMIN 2.9*   CBG: No results for input(s): GLUCAP in the last 168 hours.  Discharge time spent: greater than 30 minutes.  Signed: Arnetha Courser, MD [facility] 04/18/2021

## 2021-04-18 NOTE — Assessment & Plan Note (Signed)
Patient presents for evaluation of left lateral chest wall pain associated with fever, chills and a productive cough. ?He has a large left pleural effusion with consolidation in the left lung ?Patient has a history of IV drug use and is at increased risk for infection with MRSA. He is allergic to vancomycin. ?Pleural fluid preliminary cultures remain negative, although consistent with exudative fluid. ?1/4 blood cultures with MRSE, most likely a contaminant. ?ID was consulted-will appreciate their input. ?Concern of TB-labs pending. ?-Sputum induction for AFB culture ordered. ?-Continue with Zosyn and Zyvox for now ?

## 2021-04-18 NOTE — Assessment & Plan Note (Signed)
Resolved.  Most likely secondary to poor p.o. intake as it resolved with IV fluid. ?-Continue to monitor ?

## 2021-04-18 NOTE — Progress Notes (Signed)
Pharmacy Antibiotic Note ? ?Lance Marquez is a 26 y.o. male admitted on 04/18/2021 with pneumonia.  Pharmacy has been consulted for zosyn dosing. ? ?Pt was recently admitted for [facility] for CP and a variety of symptoms. He has a hx of IV drug use. He has been on linezolid/zosyn for PNA. ID is on board.  ? ? ?Plan: ?Zosyn 3.375g IV q8 ? ?  ? ?Temp (24hrs), Avg:98.3 ?F (36.8 ?C), Min:98 ?F (36.7 ?C), Max:98.4 ?F (36.9 ?C) ? ?Recent Labs  ?Lab 04/16/21 ?1113 04/16/21 ?1342 04/17/21 ?0933 04/17/21 ?1641 04/18/21 ?8466  ?WBC 21.5*  --  14.1*  --  14.8*  ?CREATININE 0.58*  --  0.64  --   --   ?LATICACIDVEN  --  0.9  --  0.9  --   ?  ?Estimated Creatinine Clearance: 117.8 mL/min (by C-G formula based on SCr of 0.64 mg/dL).   ? ?Allergies  ?Allergen Reactions  ? Vancomycin Rash  ?  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 ? ?Antimicrobials this admission: ?Zosyn + linezolid 3/6 >> ? ?Dose adjustments this admission: ? ? ?Microbiology results: ?3/6 pleural>>ngtd ?--MRSA PCR pos ?--3/6 Bcx 1/4 GPC, BCID = MRSE (likely contam) ? ?Ulyses Southward, PharmD, BCIDP, AAHIVP, CPP ?Infectious Disease Pharmacist ?04/18/2021 8:34 PM ? ? ? ?

## 2021-04-18 NOTE — Progress Notes (Signed)
?    [facility]   ? ?Date of Admission:  04/16/2021   Total days of antibiotics 3/ linezolid plus piptazo ?        ? ?ID: Lance Marquez is a 26 y.o. male with  left lobar pneumonia/empyema ?Principal Problem: ?  Pleural effusion on left ?Active Problems: ?  Hyponatremia ?  Drug abuse and dependence ([place]) ?  Lobar pneumonia ([place]) ?  Unspecified protein-calorie malnutrition ([place]) ? ? ? ?Subjective: ?Patient reports sleeping poorly until he was started on suboxone this morning at 4 am. Slept this morning. He is afebrile. Still has cough, non productive, but doesn't feel he can take deep inspiration. ? ?Medications:  ? buprenorphine-naloxone  1 tablet Sublingual BID  ? enoxaparin (LOVENOX) injection  40 mg Subcutaneous Q24H  ? lidocaine  1 patch Transdermal Q24H  ? melatonin  5 mg Oral QHS  ? ? ?Objective: ?Vital signs in last 24 hours: ?Temp:  [98 ?F (36.7 ?C)-98.4 ?F (36.9 ?C)] 98 ?F (36.7 ?C) (03/08 1200) ?Pulse Rate:  [89-108] 93 (03/08 0823) ?Resp:  [16-20] 16 (03/08 1200) ?BP: (113-141)/(67-80) 120/70 (03/08 1200) ?SpO2:  [97 %-100 %] 99 % (03/08 1200) ?Physical Exam  ?Constitutional: He is oriented to person, place, and time. He appears ill appearing and mal-nourished. No distress.  ?HENT:  ?Mouth/Throat: Oropharynx is clear and moist. No oropharyngeal exudate.  ?Cardiovascular: Normal rate, regular rhythm and normal heart sounds. Exam reveals no gallop and no friction rub.  ?No murmur heard.  ?Pulmonary/Chest: Effort normal and breath sounds unequal, decrease BS on left ?Abdominal: Soft. Bowel sounds are normal. He exhibits no distension. There is no tenderness.  ?Lymphadenopathy:  ?He has no cervical adenopathy.  ?Neurological: He is alert and oriented to person, place, and time.  ?Skin: Skin is warm and dry. Scattered pustules ?Psychiatric: He has a normal mood and affect. His behavior is normal.  ? ? ?Lab Results ?Recent Labs  ?  04/16/21 ?1113 04/17/21 ?0933 04/18/21 ?0943   ?WBC 21.5* 14.1* 14.8*  ?HGB 11.9* 11.7* 11.3*  ?HCT 36.1* 35.0* 33.4*  ?NA 129* 135  --   ?K 3.7 4.7  --   ?CL 97* 102  --   ?CO2 25 25  --   ?BUN 9 7  --   ?CREATININE 0.58* 0.64  --   ? ?Liver Panel ?Recent Labs  ?  04/16/21 ?1113  ?PROT 7.9  ?ALBUMIN 2.9*  ?AST 25  ?ALT 27  ?ALKPHOS 116  ?BILITOT 1.0  ?BILIDIR 0.3*  ?IBILI 0.7  ? ? ? ?Microbiology: ?Pleural fluid 3/6: no growth to date ?MRSA screen + ?HIV + ?Blood cx 1/4 MRSE ?Strep pneumo NEGATIVE ? ?Studies/Results: ?No results found. ? ? ?Assessment/Plan: ?Complicated pneumonia, with empyema, loculated = continue with linezolid plus piptazo. Agree with pulmonology that patient would benefit from chest tube for drainage. Recommend to re-send for aerobic culture PLUS AFB culture to rule out for mTB(given incarcerated for 1 yr, released in [employer]). Continue with airborne isolation ? ?Opiate withdrawal = will move up his suboxone schedule to receive next dose at 6pm - to do 6 and 6 scheduling, since his first dose was at 4am ? ?Hx of iv drug use = recommend to check for hep C ab. ? ?[name] ?[facility] ?Pager: [phone] ? ?04/18/2021, 4:57 PM ? ? ? ? ? ?

## 2021-04-19 ENCOUNTER — Inpatient Hospital Stay (HOSPITAL_COMMUNITY): Payer: Self-pay

## 2021-04-19 ENCOUNTER — Encounter (HOSPITAL_COMMUNITY)
Admission: AD | Disposition: A | Discharge status: Home / Self Care | Payer: Self-pay | Source: Other Acute Inpatient Hospital | Attending: Internal Medicine

## 2021-04-19 DIAGNOSIS — J869 Pyothorax without fistula: Secondary | ICD-10-CM

## 2021-04-19 DIAGNOSIS — J90 Pleural effusion, not elsewhere classified: Secondary | ICD-10-CM

## 2021-04-19 DIAGNOSIS — F1193 Opioid use, unspecified with withdrawal: Secondary | ICD-10-CM

## 2021-04-19 DIAGNOSIS — Z9689 Presence of other specified functional implants: Secondary | ICD-10-CM

## 2021-04-19 LAB — CBC WITH DIFFERENTIAL/PLATELET
Abs Immature Granulocytes: 0.22 10*3/uL — ABNORMAL HIGH (ref 0.00–0.07)
Basophils Absolute: 0.1 10*3/uL (ref 0.0–0.1)
Basophils Relative: 1 %
Eosinophils Absolute: 0.1 10*3/uL (ref 0.0–0.5)
Eosinophils Relative: 1 %
HCT: 35.3 % — ABNORMAL LOW (ref 39.0–52.0)
Hemoglobin: 11.7 g/dL — ABNORMAL LOW (ref 13.0–17.0)
Immature Granulocytes: 1 %
Lymphocytes Relative: 30 %
Lymphs Abs: 4.5 10*3/uL — ABNORMAL HIGH (ref 0.7–4.0)
MCH: 28.7 pg (ref 26.0–34.0)
MCHC: 33.1 g/dL (ref 30.0–36.0)
MCV: 86.7 fL (ref 80.0–100.0)
Monocytes Absolute: 1.5 10*3/uL — ABNORMAL HIGH (ref 0.1–1.0)
Monocytes Relative: 10 %
Neutro Abs: 8.9 10*3/uL — ABNORMAL HIGH (ref 1.7–7.7)
Neutrophils Relative %: 57 %
Platelets: 530 10*3/uL — ABNORMAL HIGH (ref 150–400)
RBC: 4.07 MIL/uL — ABNORMAL LOW (ref 4.22–5.81)
RDW: 15.5 % (ref 11.5–15.5)
WBC: 15.3 10*3/uL — ABNORMAL HIGH (ref 4.0–10.5)
nRBC: 0 % (ref 0.0–0.2)

## 2021-04-19 LAB — PROTIME-INR
INR: 1.2 (ref 0.8–1.2)
Prothrombin Time: 14.7 seconds (ref 11.4–15.2)

## 2021-04-19 LAB — COMPREHENSIVE METABOLIC PANEL
ALT: 15 U/L (ref 0–44)
AST: 13 U/L — ABNORMAL LOW (ref 15–41)
Albumin: 2.2 g/dL — ABNORMAL LOW (ref 3.5–5.0)
Alkaline Phosphatase: 82 U/L (ref 38–126)
Anion gap: 9 (ref 5–15)
BUN: 5 mg/dL — ABNORMAL LOW (ref 6–20)
CO2: 24 mmol/L (ref 22–32)
Calcium: 8.4 mg/dL — ABNORMAL LOW (ref 8.9–10.3)
Chloride: 104 mmol/L (ref 98–111)
Creatinine, Ser: 0.75 mg/dL (ref 0.61–1.24)
GFR, Estimated: >60 mL/min (ref >60)
Glucose, Bld: 65 mg/dL — ABNORMAL LOW (ref 70–99)
Potassium: 3.6 mmol/L (ref 3.5–5.1)
Sodium: 137 mmol/L (ref 135–145)
Total Bilirubin: <0.1 mg/dL — ABNORMAL LOW (ref 0.3–1.2)
Total Protein: 7.2 g/dL (ref 6.5–8.1)

## 2021-04-19 LAB — CULTURE, BLOOD (ROUTINE X 2): Special Requests: ADEQUATE

## 2021-04-19 LAB — LEGIONELLA PNEUMOPHILA SEROGP 1 UR AG: L. pneumophila Serogp 1 Ur Ag: NEGATIVE

## 2021-04-19 LAB — PROCALCITONIN: Procalcitonin: 0.1 ng/mL

## 2021-04-19 LAB — MAGNESIUM: Magnesium: 2 mg/dL (ref 1.7–2.4)

## 2021-04-19 LAB — PREALBUMIN: Prealbumin: 6.8 mg/dL — ABNORMAL LOW (ref 18–38)

## 2021-04-19 SURGERY — CHEST TUBE INSERTION
Anesthesia: LOCAL | Laterality: Left

## 2021-04-19 MED ORDER — HYDROMORPHONE HCL 1 MG/ML IJ SOLN
1.0000 mg | INTRAMUSCULAR | Status: DC | PRN
  Administered 2021-04-19 – 2021-04-20 (×3): 1 mg via INTRAVENOUS
  Filled 2021-04-19 (×4): qty 1

## 2021-04-19 MED ORDER — AMPICILLIN-SULBACTAM SODIUM 3 (2-1) G IJ SOLR
3.0000 g | Freq: Four times a day (QID) | INTRAMUSCULAR | Status: DC
  Administered 2021-04-19 – 2021-04-22 (×13): 3 g via INTRAVENOUS
  Filled 2021-04-19 (×15): qty 8

## 2021-04-19 MED ORDER — ENSURE ENLIVE PO LIQD
237.0000 mL | Freq: Three times a day (TID) | ORAL | Status: DC
  Administered 2021-04-19 – 2021-04-24 (×9): 237 mL via ORAL

## 2021-04-19 MED ORDER — SODIUM CHLORIDE 0.9% FLUSH
10.0000 mL | Freq: Three times a day (TID) | INTRAVENOUS | Status: DC
  Administered 2021-04-19 – 2021-04-23 (×12): 10 mL

## 2021-04-19 MED ORDER — ADULT MULTIVITAMIN W/MINERALS CH
1.0000 | ORAL_TABLET | Freq: Every day | ORAL | Status: DC
  Administered 2021-04-20 – 2021-04-24 (×5): 1 via ORAL
  Filled 2021-04-19 (×6): qty 1

## 2021-04-19 MED ORDER — MUPIROCIN 2 % EX OINT
1.0000 "application " | TOPICAL_OINTMENT | Freq: Two times a day (BID) | CUTANEOUS | Status: AC
  Administered 2021-04-19 – 2021-04-23 (×10): 1 via NASAL
  Filled 2021-04-19: qty 22

## 2021-04-19 MED ORDER — KETOROLAC TROMETHAMINE 30 MG/ML IJ SOLN
30.0000 mg | Freq: Four times a day (QID) | INTRAMUSCULAR | Status: DC | PRN
  Administered 2021-04-19 – 2021-04-23 (×13): 30 mg via INTRAVENOUS
  Filled 2021-04-19 (×13): qty 1

## 2021-04-19 MED ORDER — LIDOCAINE HCL (PF) 1 % IJ SOLN
INTRAMUSCULAR | Status: AC
  Filled 2021-04-19: qty 30

## 2021-04-19 MED ORDER — SODIUM CHLORIDE (PF) 0.9 % IJ SOLN
10.0000 mg | Freq: Once | INTRAMUSCULAR | Status: AC
  Administered 2021-04-19: 10 mg via INTRAPLEURAL
  Filled 2021-04-19 (×2): qty 10

## 2021-04-19 MED ORDER — STERILE WATER FOR INJECTION IJ SOLN
5.0000 mg | Freq: Once | RESPIRATORY_TRACT | Status: AC
  Administered 2021-04-19: 14:00:00 5 mg via INTRAPLEURAL
  Filled 2021-04-19 (×2): qty 5

## 2021-04-19 MED ORDER — CHLORHEXIDINE GLUCONATE CLOTH 2 % EX PADS
6.0000 | MEDICATED_PAD | Freq: Every day | CUTANEOUS | Status: AC
  Administered 2021-04-19 – 2021-04-23 (×5): 6 via TOPICAL

## 2021-04-19 NOTE — Consult Note (Addendum)
[address]       [place] [address]             [phone]        Lance Marquez Medical Record [id number] Date of Birth: Mar 24, 1995  Referring: Lorella Nimrod, MD Primary Care: Pcp, No Primary Cardiologist:None  Reason for consult: Evaluation for management of left loculated pleural effusion.  History of Present Illness:    [name] is a 26 year old male with a past history significant for IV heroin use, most recently used on 04/16/2021.  He was recently incarcerated for period of 1 year having been released in January of this year.  He presented to [facility] emergency room via EMS on 04/16/2021 with complaint of left lateral chest pain of 3 days duration along with fever and chills and cough with occasional production of yellow phlegm.  Evaluation in the ED included CBC with a white blood count of 21,500.  Chest x-ray showed a large left pleural effusion.  CTA chest was negative for pulmonary embolus and confirmed the large left pleural effusion with compression of the left lower lobe and lingula and likely loculations within the collection.  He was admitted to the hospital.  Infectious disease service was consulted.  Broad-spectrum antibiotics were started empirically including linezolid and Zosyn.  The interventional radiology team was consulted for left thoracentesis.  This was performed on 04/16/2021 yielding 670 mL of yellow fluid.  Procedure was stopped due to patient developing pain.  Follow-up chest x-ray after thoracentesis showed persistent left pleural effusion fluid and atelectasis.  There was no pneumothorax.  On advice of the infectious disease service, [name] was transferred from [facility] to [facility] last evening by the hospitalist service for evaluation by CT surgery for further management of this loculated pleural effusion.  Currently, [name] is resting quietly in bed.   He denies having any pain or shortness of breath.  He is oxygenating well on room air. He was apparently demonstrating withdrawal symptoms at [facility].  He has been started on Suboxone.   He has a remote history of asthma.  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  During that project, he was exposed to a lot of particulate matter and mold that he thinks may have contributed to his current lung issues.  Blood cultures obtained on admission are positive for methicillin-resistant staph and for Staph epidermidis.  The Gram stain of the pleural fluid demonstrated no organisms.  Delorse Lek have shown no growth at 3 days.  Screening for Mycobacterium is pending.    Current Activity/ Functional Status: Patient is independent with mobility/ambulation, transfers, ADL's, IADL's.   Zubrod Score: At the time of surgery this patients most appropriate activity status/level should be described as: []     0    Normal activity, no symptoms []     1    Restricted in physical strenuous activity but ambulatory, able to do out light work []     2    Ambulatory and capable of self care, unable to do work activities, up and about                 more than 50%  Of the time                            []   3    Only limited self care, in bed greater than 50% of waking hours []     4    Completely disabled, no self care, confined to bed or chair []     5    Moribund  Past Medical History:  Diagnosis Date   Asthma    C. 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."    Current Facility-Administered Medications  Medication Dose Route Frequency Provider Last Rate Last Admin   acetaminophen (TYLENOL) tablet 650 mg  650 mg Oral Q6H PRN Howerter, Justin B, DO       Or   acetaminophen (TYLENOL) suppository 650 mg  650 mg Rectal Q6H PRN Howerter, Justin B, DO       buprenorphine-naloxone (SUBOXONE) 2-0.5 mg per SL tablet 1 tablet  1 tablet Sublingual PRN Howerter, Justin B, DO       buprenorphine-naloxone (SUBOXONE) 8-2 mg per SL tablet 1 tablet  1 tablet Sublingual BID Howerter, Justin B, DO   1 tablet at 04/18/21 2333   lidocaine (LIDODERM) 5 % 1 patch  1 patch Transdermal Q24H Howerter, Justin B, DO       linezolid (ZYVOX) IVPB 600 mg  600 mg Intravenous Q12H Howerter, Justin B, DO   Stopped at 04/19/21 0052   melatonin tablet 5 mg  5 mg Oral QHS Howerter, Justin B, DO   5 mg at 04/18/21 2330   piperacillin-tazobactam (ZOSYN) IVPB 3.375 g  3.375 g Intravenous Q8H Pham, Minh Q, RPH-CPP 12.5 mL/hr at 04/19/21 0623 3.375 g at 04/19/21 [id number]   senna-docusate (Senokot-S) tablet 1 tablet  1 tablet Oral QHS PRN Howerter, Justin B, DO       traZODone (DESYREL) tablet 50 mg  50 mg Oral QHS PRN Howerter, Justin B, DO        Medications Prior to Admission  Medication Sig Dispense Refill Last Dose   acetaminophen (TYLENOL) 325 MG tablet Take 2 tablets (650 mg total) by mouth every 6 (six) hours as needed for mild pain, headache or fever.      buprenorphine-naloxone (SUBOXONE) 8-2 mg SUBL SL tablet Place 1 tablet under the tongue 2 (two) times daily. 30 tablet     enoxaparin (LOVENOX) 40 MG/0.4ML injection Inject 0.4 mLs (40 mg total) into the skin daily. 0 mL     lidocaine (LIDODERM) 5 % Place 1 patch onto the skin daily. Remove & Discard patch within 12 hours or as directed by MD 30 patch 0    linezolid  (ZYVOX) 600 MG/300ML IVPB Inject 300 mLs (600 mg total) into the vein every 12 (twelve) hours. 100 mL     melatonin 5 MG TABS Take 1 tablet (5 mg total) by mouth at bedtime.  0    piperacillin-tazobactam (ZOSYN) 3.375 GM/50ML IVPB Inject 50 mLs (3.375 g  total) into the vein every 8 (eight) hours. 50 mL     senna-docusate (SENOKOT-S) 8.6-50 MG tablet Take 1 tablet by mouth at bedtime as needed for mild constipation. 20 tablet 0    traZODone (DESYREL) 50 MG tablet Take 1 tablet (50 mg total) by mouth at bedtime as needed for sleep.       History reviewed. No pertinent family history.   Review of Systems:      Cardiac Review of Systems: Y or  [    ]= no  Chest Pain [    ] Resting SOB [   ] Exertional SOB  [ x ]  Orthopnea [  ]   Pedal Edema [   ]    Palpitations [  ] Syncope  [  ]   Presyncope [   ]  General Review of Systems: [Y] = yes [  ]=no Constitional: recent weight change [  ]; anorexia [  ]; fatigue [  ]; nausea [  ]; night sweats [  ]; fever [ x ]; or chills [ x ]                                                                 Eye : blurred vision [  ]; diplopia [   ]; vision changes [  ];  Amaurosis fugax[  ]; Resp: cough [ x ];  wheezing[  ];  hemoptysis[  ]; shortness of breath[ x ]; paroxysmal nocturnal dyspnea[  ]; dyspnea on exertion[  ]; or orthopnea[  ];  GI:  gallstones[  ], vomiting[  ];  dysphagia[  ]; melena[  ];  hematochezia [  ]; heartburn[  ];   Hx of  Colonoscopy[  ]; GU: kidney stones [  ]; hematuria[  ];   dysuria [  ];  nocturia[  ];  history of     obstruction [  ]; urinary frequency [  ]             Skin: rash, swelling[  ];, hair loss[  ];  peripheral edema[  ];  or itching[  ]; Musculosketetal: myalgias[  ];  joint swelling[  ];  joint erythema[  ];  joint pain[  ];  back pain[  ];  Heme/Lymph: bruising[  ];  bleeding[  ];  anemia[  ];  Neuro: TIA[  ];  headaches[  ];  stroke[  ];  vertigo[  ];  seizures[  ];   paresthesias[  ];  difficulty walking[   ];  Psych:depression[  ]; anxiety[  ];  Endocrine: diabetes[  ];  thyroid dysfunction[  ];            Physical Exam: BP 115/66 (BP Location: Left Arm)    Pulse 100    Temp 99.4 F (37.4 C) (Oral)    Resp 16    Ht 5\' 9"  (1.753 m)    Wt 68.5 kg    SpO2 95%    BMI 22.30 kg/m    General appearance: alert, cooperative, and no distress Head: Normocephalic, without obvious abnormality, atraumatic Neck: no adenopathy, no carotid bruit, no JVD, and supple, symmetrical, trachea midline Lymph nodes: No cervical or clavicular adenopathy Resp: Breath sounds clear on the right, clear but diminished on the  left. Cardio: Regular rhythm, mildly tachycardic. GI: Soft, nontender.  Active bowel sounds. Extremities: No deformities, all are well perfused with easily palpable distal pulses.   Neurologic: Grossly normal  Diagnostic Studies & Laboratory data:     Recent Radiology Findings:    CLINICAL DATA:  Left pleural effusion   EXAM: PORTABLE CHEST 1 VIEW   COMPARISON:  04/16/2021   FINDINGS: Persistent left pleural effusion with adjacent atelectasis. No pneumothorax. Right lung remains clear. Stable cardiomediastinal contours.   IMPRESSION: Similar left pleural effusion and adjacent atelectasis. No pneumothorax.     Electronically Signed   By: Macy Mis M.D.   On: 04/19/2021 08:01   CLINICAL DATA:  Pulmonary embolism (PE) suspected, high prob   EXAM: CT ANGIOGRAPHY CHEST WITH CONTRAST   TECHNIQUE: Multidetector CT imaging of the chest was performed using the standard protocol during bolus administration of intravenous contrast. Multiplanar CT image reconstructions and MIPs were obtained to evaluate the vascular anatomy.   RADIATION DOSE REDUCTION: This exam was performed according to the departmental dose-optimization program which includes automated exposure control, adjustment of the mA and/or kV according to patient size and/or use of iterative reconstruction  technique.   CONTRAST:  74mL OMNIPAQUE IOHEXOL 350 MG/ML SOLN   COMPARISON:  None.   FINDINGS: Cardiovascular: Satisfactory opacification of the pulmonary arteries to the segmental level. No evidence of pulmonary embolism. Normal heart size. No pericardial effusion.   Mediastinum/Nodes: No enlarged nodes. Thyroid and esophagus are unremarkable.   Lungs/Pleura: Large left pleural effusion. Collapse of the most of the left lower lobe and lingula. Right lung is clear. No pneumothorax.   Upper Abdomen: No acute abnormality.   Musculoskeletal: Age-indeterminate mild loss of height at the superior endplates of T2 and T3.   Review of the MIP images confirms the above findings.   IMPRESSION: Large left pleural effusion with significant lower lobe and lingular collapse. Effusion may be partially loculated. Underlying pneumonia is not excluded.   No evidence of pulmonary embolism.   Age-indeterminate mild T2 and T3 compression fractures are probably chronic.     Electronically Signed   By: Macy Mis M.D.   On: 04/16/2021 12:39   I have independently reviewed the above radiologic studies and discussed with the patient   Recent Lab Findings: Lab Results  Component Value Date   WBC 15.3 (H) 04/19/2021   HGB 11.7 (L) 04/19/2021   HCT 35.3 (L) 04/19/2021   PLT 530 (H) 04/19/2021   GLUCOSE 65 (L) 04/19/2021   ALT 15 04/19/2021   AST 13 (L) 04/19/2021   NA 137 04/19/2021   K 3.6 04/19/2021   CL 104 04/19/2021   CREATININE 0.75 04/19/2021   BUN 5 (L) 04/19/2021   CO2 24 04/19/2021   TSH 2.134 Test methodology is 3rd generation TSH 06/22/2009   INR 1.2 04/19/2021      Assessment / Plan:      26 year old male IV drug abuser recently incarcerated presents with left loculated pleural effusion.  This was partially drained by interventional radiology for thoracentesis.  Fluid cultures have shown no growth to date.  Fungal and mycobacterial studies are pending.  Empiric  Zosyn and linezolid.  He has an allergy to vancomycin.  The surgery has been asked to evaluate for management of the residual loculated effusion on the left that has compressed most of the left lower lobe and lingula.  He is currently stable without any dyspnea on room air.  CT scan is reviewed.  It would  appear this could potentially be managed with lytic therapy with a good result.  We recommend this as a first-line of therapy.  If unable to achieve adequate clearing of the effusion and reexpansion of the left lung, we would consider left video-assisted thoracoscopy for drainage and decortication.    I  spent 20 minutes counseling the patient face to face.   Antony Odea, PA-C  04/19/2021 8:37 AM   Chart reviewed, patient examined, agree with above. This looks like a single large collection that has a good chance of resolving with a pigtail catheter and lytic therapy. I think that is the best initial option in this difficult patient.

## 2021-04-19 NOTE — Progress Notes (Signed)
[facility]   Reason for visit: Follow up on empyema  Interval History: transferred here to [facility] yesterday; WBC trending down to 15.3, remains afebrile.  No rash or diarrhea  Day 4 linezolid + pip/tazo  Physical Exam: Constitutional:  Vitals:   04/19/21 0557 04/19/21 0810  BP: 115/66   Pulse: (!) 104 100  Resp: 16 16  Temp: 99.4 F (37.4 C) 99.4 F (37.4 C)  SpO2: 96% 95%   patient appears in NAD Respiratory: Normal respiratory effort; CTA B Cardiovascular: RRR   Review of Systems: Constitutional: negative for fevers and chills Gastrointestinal: negative for nausea and diarrhea  Lab Results  Component Value Date   WBC 15.3 (H) 04/19/2021   HGB 11.7 (L) 04/19/2021   HCT 35.3 (L) 04/19/2021   MCV 86.7 04/19/2021   PLT 530 (H) 04/19/2021    Lab Results  Component Value Date   CREATININE 0.75 04/19/2021   BUN 5 (L) 04/19/2021   NA 137 04/19/2021   K 3.6 04/19/2021   CL 104 04/19/2021   CO2 24 04/19/2021    Lab Results  Component Value Date   ALT 15 04/19/2021   AST 13 (L) 04/19/2021   ALKPHOS 82 04/19/2021     Microbiology: Recent Results (from the past 240 hour(s))  Blood culture (routine x 2)     Status: Abnormal   Collection Time: 04/16/21  1:35 PM   Specimen: BLOOD  Result Value Ref Range Status   Specimen Description   Final    BLOOD BLOOD LEFT FOREARM Performed at [facility], [address], [place], [place] [address]    Special Requests   Final    BOTTLES DRAWN AEROBIC AND ANAEROBIC Blood Culture adequate volume Performed at [facility], [address] [place], [place] [address]    Culture  Setup Time   Final    GRAM POSITIVE COCCI ANAEROBIC BOTTLE ONLY CRITICAL RESULT CALLED TO, READ BACK BY AND VERIFIED WITH: DEVAN MITCHELL 04/17/21 1442 MW GRAM STAIN REVIEWED-AGREE WITH RESULT    Culture (A)  Final    STAPHYLOCOCCUS EPIDERMIDIS THE SIGNIFICANCE OF ISOLATING THIS ORGANISM FROM A SINGLE  SET OF BLOOD CULTURES WHEN MULTIPLE SETS ARE DRAWN IS UNCERTAIN. PLEASE NOTIFY THE MICROBIOLOGY DEPARTMENT WITHIN ONE WEEK IF SPECIATION AND SENSITIVITIES ARE REQUIRED. Performed at [facility], [address] [place], [place] [address]    Report Status 04/19/2021 FINAL  Final  Blood Culture ID Panel (Reflexed)     Status: Abnormal   Collection Time: 04/16/21  1:35 PM  Result Value Ref Range Status   Enterococcus faecalis NOT DETECTED NOT DETECTED Final   Enterococcus Faecium NOT DETECTED NOT DETECTED Final   Listeria monocytogenes NOT DETECTED NOT DETECTED Final   Staphylococcus species DETECTED (A) NOT DETECTED Final    Comment: CRITICAL RESULT CALLED TO, READ BACK BY AND VERIFIED WITH: DEVAN MITCHELL 04/17/21 1143 MW    Staphylococcus aureus (BCID) NOT DETECTED NOT DETECTED Final   Staphylococcus epidermidis DETECTED (A) NOT DETECTED Final    Comment: Methicillin (oxacillin) resistant coagulase negative staphylococcus. Possible blood culture contaminant (unless isolated from more than one blood culture draw or clinical case suggests pathogenicity). No antibiotic treatment is indicated for blood  culture contaminants. CRITICAL RESULT CALLED TO, READ BACK BY AND VERIFIED WITH: [facility] MITCHELL 04/17/21 1143 MW    Staphylococcus lugdunensis NOT DETECTED NOT DETECTED Final   Streptococcus species NOT DETECTED NOT DETECTED Final   Streptococcus agalactiae NOT DETECTED  NOT DETECTED Final   Streptococcus pneumoniae NOT DETECTED NOT DETECTED Final   Streptococcus pyogenes NOT DETECTED NOT DETECTED Final   A.calcoaceticus-baumannii NOT DETECTED NOT DETECTED Final   Bacteroides fragilis NOT DETECTED NOT DETECTED Final   Enterobacterales NOT DETECTED NOT DETECTED Final   Enterobacter cloacae complex NOT DETECTED NOT DETECTED Final   Escherichia coli NOT DETECTED NOT DETECTED Final   Klebsiella aerogenes NOT DETECTED NOT DETECTED Final   Klebsiella oxytoca NOT DETECTED NOT DETECTED Final    Klebsiella pneumoniae NOT DETECTED NOT DETECTED Final   Proteus species NOT DETECTED NOT DETECTED Final   Salmonella species NOT DETECTED NOT DETECTED Final   Serratia marcescens NOT DETECTED NOT DETECTED Final   Haemophilus influenzae NOT DETECTED NOT DETECTED Final   Neisseria meningitidis NOT DETECTED NOT DETECTED Final   Pseudomonas aeruginosa NOT DETECTED NOT DETECTED Final   Stenotrophomonas maltophilia NOT DETECTED NOT DETECTED Final   Candida albicans NOT DETECTED NOT DETECTED Final   Candida auris NOT DETECTED NOT DETECTED Final   Candida glabrata NOT DETECTED NOT DETECTED Final   Candida krusei NOT DETECTED NOT DETECTED Final   Candida parapsilosis NOT DETECTED NOT DETECTED Final   Candida tropicalis NOT DETECTED NOT DETECTED Final   Cryptococcus neoformans/gattii NOT DETECTED NOT DETECTED Final   Methicillin resistance mecA/C DETECTED (A) NOT DETECTED Final    Comment: CRITICAL RESULT CALLED TO, READ BACK BY AND VERIFIED WITH: Drusilla KannerDEVAN MITCHELL 04/17/21 1143 MW Performed at [facility], [address] [place], [place] [address]   Blood culture (routine x 2)     Status: None (Preliminary result)   Collection Time: 04/16/21  1:42 PM   Specimen: BLOOD  Result Value Ref Range Status   Specimen Description BLOOD BLOOD RIGHT HAND  Final   Special Requests   Final    BOTTLES DRAWN AEROBIC AND ANAEROBIC Blood Culture adequate volume   Culture   Final    NO GROWTH 3 DAYS Performed at [facility], [address] [place], [place] [address]    Report Status PENDING  Incomplete  Resp Panel by RT-PCR (Flu A&B, Covid) Nasopharyngeal Swab     Status: None   Collection Time: 04/16/21  2:05 PM   Specimen: Nasopharyngeal Swab; Nasopharyngeal(NP) swabs in vial transport medium  Result Value Ref Range Status   SARS Coronavirus 2 by RT PCR NEGATIVE NEGATIVE Final    Comment: (NOTE) SARS-CoV-2 target nucleic acids are NOT DETECTED.  The SARS-CoV-2 RNA is generally  detectable in upper respiratory specimens during the acute phase of infection. The lowest concentration of SARS-CoV-2 viral copies this assay can detect is 138 copies/mL. A negative result does not preclude SARS-Cov-2 infection and should not be used as the sole basis for treatment or other patient management decisions. A negative result may occur with  improper specimen collection/handling, submission of specimen other than nasopharyngeal swab, presence of viral mutation(s) within the areas targeted by this assay, and inadequate number of viral copies(<138 copies/mL). A negative result must be combined with clinical observations, patient history, and epidemiological information. The expected result is Negative.  Fact Sheet for Patients:  BloggerCourse.comhttps://www.fda.gov/media/152166/download  Fact Sheet for Healthcare Providers:  SeriousBroker.ithttps://www.fda.gov/media/152162/download  This test is no t yet approved or cleared by the [place] FDA and  has been authorized for detection and/or diagnosis of SARS-CoV-2 by FDA under an Emergency Use Authorization (EUA). This EUA will remain  in effect (meaning this test can be used) for the duration of the COVID-19 declaration under Section  564(b)(1) of the Act, 21 U.S.C.section 360bbb-3(b)(1), unless the authorization is terminated  or revoked sooner.       Influenza A by PCR NEGATIVE NEGATIVE Final   Influenza B by PCR NEGATIVE NEGATIVE Final    Comment: (NOTE) The Xpert Xpress SARS-CoV-2/FLU/RSV plus assay is intended as an aid in the diagnosis of influenza from Nasopharyngeal swab specimens and should not be used as a sole basis for treatment. Nasal washings and aspirates are unacceptable for Xpert Xpress SARS-CoV-2/FLU/RSV testing.  Fact Sheet for Patients: BloggerCourse.com  Fact Sheet for Healthcare Providers: SeriousBroker.it  This test is not yet approved or cleared by the [place] FDA  and has been authorized for detection and/or diagnosis of SARS-CoV-2 by FDA under an Emergency Use Authorization (EUA). This EUA will remain in effect (meaning this test can be used) for the duration of the COVID-19 declaration under Section 564(b)(1) of the Act, 21 U.S.C. section 360bbb-3(b)(1), unless the authorization is terminated or revoked.  Performed at [facility], [address] [place], [place] [address]   Body fluid culture w Gram Stain     Status: None (Preliminary result)   Collection Time: 04/16/21  3:55 PM   Specimen: PATH Cytology Pleural fluid  Result Value Ref Range Status   Specimen Description   Final    PLEURAL Performed at [facility], [address], [place], [place] [address]    Special Requests   Final    NONE Performed at [facility], [address] [place], [place] [address]    Gram Stain RARE WBC SEEN NO ORGANISMS SEEN   Final   Culture   Final    NO GROWTH 3 DAYS Performed at [facility], [address], [place], [place] [address]    Report Status PENDING  Incomplete  MT-RIF NAA W Cult, Non-Sputum     Status: None (Preliminary result)   Collection Time: 04/16/21  3:55 PM   Specimen: PATH Cytology Pleural fluid  Result Value Ref Range Status   Source PLEURAL  Final    Comment: Performed at [facility], [address], [place], [place] [address]   Specimen Source PENDING  Incomplete   M tuberculosis complex PENDING  Incomplete   Rifampin PENDING  Incomplete   AFB Specimen Processing Concentration  Final    Comment: (NOTE) Performed At: [facility] [address] [place], [place] [id number] Jolene Schimke MD GP:[id number]    Acid Fast Culture PENDING  Incomplete  MRSA Next Gen by PCR, Nasal     Status: Abnormal   Collection Time: 04/17/21  9:43 AM   Specimen: Nasal Mucosa; Nasal Swab  Result Value Ref Range Status   MRSA by PCR Next Gen DETECTED (A) NOT DETECTED Final     Comment: RESULT CALLED TO, READ BACK BY AND VERIFIED WITH: Sung Amabile, RN 04/17/21 1102 MW (NOTE) The GeneXpert MRSA Assay (FDA approved for NASAL specimens only), is one component of a comprehensive MRSA colonization surveillance program. It is not intended to diagnose MRSA infection nor to guide or monitor treatment for MRSA infections. Test performance is not FDA approved in patients less than 78 years old. Performed at [facility], [address] [place], [place] [address]     Impression/Plan:  1. Empyema - loculated and no positive cultures to date from thoracentesis done on 04/16/21.  Seen by thoracic surgery and considering lytic therapy. He has been on broad antibiotics and will change the piperacillin/tazobactam to ampicillin/sulbactam and  continue linezolid.  2.  Medication monitoring - will continue monitoring CMP, CBC while on linezolid.   3.  Infection prevention - on airborne isolation for concern for tuberculosis and sample sent with the thoracentesis for NAA with culture and results pending at this time.  Will continue with isolation pending these results.    Will continue to follow

## 2021-04-19 NOTE — Progress Notes (Signed)
?PROGRESS NOTE ? ? ? ?Lance Marquez  [id number]:[id number] DOB: November 23, 1995 DOA: 04/18/2021 ?PCP: Pcp, No  ? ? ?Brief Narrative:  ?Lance Marquez is a 26 y.o. male with past medical history of septic arthritis, IV drug abuse presented to hospital with left lateral chest wall pain for 3 days which was severe in intensity with some fever chills and productive yellowish sputum.  In the ED patient had a chest x-ray which showed large left pleural effusion with adjacent consolidation.  CT surgery was consulted and patient underwent IR guided thoracocentesis with pigtail catheter placement.  Pulmonary was then consulted.  There was removal of 650 mL of fluid from thoracocentesis.  Infectious disease was also consulted and patient was continued on Suboxone protocol.  Patient is currently on isolation protocol for concerns of DVT.  Pleural fluid culture negative so far.     ? ?  ?Assessment and Plan: ?* Pleural effusion on left ?Complicated pneumonia with loculated empyema. Status post diagnostic thoracentesis on 04/16/2021 at [facility], exudative in nature with elevated LDH and glucose of 34..  Suspected parapneumonic effusion with loculations so the patient was transferred to [place] [facility] [place] from [facility].    1/4 blood cultures with MRSE likely contaminant.  ID has been consulted.    Continue Zosyn and Zyvox for now.  Would likely need chest tube with fibrinolytics..  Pulmonary had seen at [facility] and recommend CT surgery.  Spoke with the CT surgery at bedside we will recommend a pulmonary evaluation.  Have consulted and spoken with pulmonary for further management. ? ?Hyponatremia ?Improved at this time.  Received IV fluids.  Likely secondary to poor oral intake. ? ?Opioid withdrawal (HCC) ?IV drug abuse and dependence.  History of IV heroin usage.  Patient has been started on Suboxone protocol.  Monitor for withdrawal symptoms. ? ?Unspecified protein-calorie malnutrition (HCC) ? ?Dietitian consult.  Encourage oral nutrition. ? ? ? ?  DVT prophylaxis: SCDs Start: 04/18/21 1929 ? ? ?Code Status:   ?  Code Status: Full Code ? ?Disposition: Home ?Status is: Inpatient ?Remains inpatient appropriate because: Complicated pleural effusion, possible need for thoracic intervention, IV antibiotic. ? ? Family Communication: Spoke with the patient at bedside ? ?Consultants:  ?Infectious disease ?Pulmonary ? ?Procedures:  ?Catheter insertion with imaging guidance on 04/19/2021.   ?Fibrinolysis of complicated pleural effusion on 04/19/2021. ? ?Antimicrobials:  ?Unasyn and linezolid ? ?Anti-infectives (From admission, onward)  ? ? Start     Dose/Rate Route Frequency Ordered Stop  ? 04/19/21 1030  Ampicillin-Sulbactam (UNASYN) 3 g in sodium chloride 0.9 % 100 mL IVPB       ? 3 g ?200 mL/hr over 30 Minutes Intravenous Every 6 hours 04/19/21 0935    ? 04/18/21 2200  linezolid (ZYVOX) IVPB 600 mg       ?Note to Pharmacy: (has been on Zyvox per ID recs during hospitalization at [facility] leading up to today's transfer to [facility]; documented allergy to Vanc)  ? 600 mg ?300 mL/hr over 60 Minutes Intravenous Every 12 hours 04/18/21 1959    ? 04/18/21 2200  piperacillin-tazobactam (ZOSYN) IVPB 3.375 g  Status:  Discontinued       ? 3.375 g ?12.5 mL/hr over 240 Minutes Intravenous Every 8 hours 04/18/21 2035 04/19/21 0935  ? ?  ? ? ? ?Subjective: ?Today, patient was seen and examined at bedside seen before the procedure.  Denied any shortness of breath or chest pain at the time of my evaluation. ? ?Objective: ?Vitals:  ? 04/18/21  [id number] 04/19/21 0130 04/19/21 0557 04/19/21 0810  ?BP:  112/72 115/66   ?Pulse: 97  (!) 104 100  ?Resp: 19 15 16 16   ?Temp: 98.3 ?F (36.8 ?C) 98.1 ?F (36.7 ?C) 99.4 ?F (37.4 ?C) 99.4 ?F (37.4 ?C)  ?TempSrc: Oral Oral Oral Oral  ?SpO2:   96% 95%  ?Weight:   68.5 kg   ?Height:   5\' 9"  (1.753 m)   ? ? ?Intake/Output Summary (Last 24 hours) at 04/19/2021 1134 ?Last data filed at 04/19/2021 0500 ?Gross per 24 hour  ?Intake 349.86 ml  ?Output --  ?Net 349.86 ml  ? ?Filed  Weights  ? 04/19/21 0557  ?Weight: 68.5 kg  ? ?Body mass index is 22.3 kg/m?.  ? ?Physical Examination: ? ?General:  Average built, not in obvious distress, chronically ill appearing. ?HENT:   No scleral pallor or icterus noted. Oral mucosa is moist.  ?Chest: Breath sounds mostly on the left side. ?CVS: S1 &S2 heard. No murmur.  Regular rate and rhythm. ?Abdomen: Soft, nontender, nondistended.  Bowel sounds are heard.   ?Extremities: No cyanosis, clubbing or edema.  Peripheral pulses are palpable. ?Psych: Alert, awake and oriented, normal mood ?CNS:  No cranial nerve deficits.  Power equal in all extremities.   ?Skin: Warm and dry.  Tattoo, multiple track marks. ? ?Data Reviewed:  ? ?CBC: ?Recent Labs  ?Lab 04/16/21 ?1113 04/17/21 ?0933 04/18/21 ?06/17/21 04/19/21 ?0336  ?WBC 21.5* 14.1* 14.8* 15.3*  ?NEUTROABS  --   --   --  8.9*  ?HGB 11.9* 11.7* 11.3* 11.7*  ?HCT 36.1* 35.0* 33.4* 35.3*  ?MCV 87.2 85.8 85.2 86.7  ?PLT 404* 452* 517* 530*  ? ? ?Basic Metabolic Panel: ?Recent Labs  ?Lab 04/16/21 ?1113 04/17/21 ?0933 04/19/21 ?0336  ?NA 129* 135 137  ?K 3.7 4.7 3.6  ?CL 97* 102 104  ?CO2 25 25 24   ?GLUCOSE 98 103* 65*  ?BUN 9 7 5*  ?CREATININE 0.58* 0.64 0.75  ?CALCIUM 8.5* 8.3* 8.4*  ?MG  --   --  2.0  ? ? ?Liver Function Tests: ?Recent Labs  ?Lab 04/16/21 ?1113 04/19/21 ?0336  ?AST 25 13*  ?ALT 27 15  ?ALKPHOS 116 82  ?BILITOT 1.0 <0.1*  ?PROT 7.9 7.2  ?ALBUMIN 2.9* 2.2*  ? ? ? ?Radiology Studies: ?DG CHEST PORT 1 VIEW ? ?Result Date: 04/19/2021 ?CLINICAL DATA:  Left pleural effusion EXAM: PORTABLE CHEST 1 VIEW COMPARISON:  04/16/2021 FINDINGS: Persistent left pleural effusion with adjacent atelectasis. No pneumothorax. Right lung remains clear. Stable cardiomediastinal contours. IMPRESSION: Similar left pleural effusion and adjacent atelectasis. No pneumothorax. Electronically Signed   By: 06/19/21 M.D.   On: 04/19/2021 08:01   ? ? ? LOS: 1 day  ? ? ?06/16/2021, MD ?[facility] ?04/19/2021, 11:34 AM  ?   ?

## 2021-04-19 NOTE — Procedures (Signed)
Pleural Fibrinolytic Administration Procedure Note ? ?Byrd Buss  ?[id number]  ?1995/04/05 ? ?Date:04/19/21  ?Time:2:06 PM  ? ?Provider Performing:Hazel Wrinkle D Rollene Rotunda  ? ?Procedure: Pleural Fibrinolysis Initial day [id number]) ? ?Indication(s) ?Fibrinolysis of complicated pleural effusion ? ?Consent ?Risks of the procedure as well as the alternatives and risks of each were explained to the patient and/or caregiver.  Consent for the procedure was obtained. ? ? ?Anesthesia ?None ? ? ?Time Out ?Verified patient identification, verified procedure, site/side was marked, verified correct patient position, special equipment/implants available, medications/allergies/relevant history reviewed, required imaging and test results available. ? ? ?Sterile Technique ?Hand hygiene, gloves ? ? ?Procedure Description ?Existing pleural catheter was cleaned and accessed in sterile manner.  10mg  of tPA in 30cc of saline and 5mg  of dornase in 30cc of sterile water were injected into pleural space using existing pleural catheter.  Catheter will be clamped for 1 hour and then placed back to suction. ? ? ?Complications/Tolerance ?None; patient tolerated the procedure well. ? ? ?EBL ?None ? ? ?Specimen(s) ?None ? ?Mikki Harbor, PA-C ?[facility] ?04/19/2021, 2:06 PM ? ?Please see [email or web address] for pager details. ? ?From 7A-7P if no response, please call [phone]. ?After hours, please call ELink [phone]. ? ?

## 2021-04-19 NOTE — Procedures (Signed)
Insertion of Chest Tube Procedure Note ? ?Lance Marquez  ?[id number]  ?[birth year] ? ?Date:04/19/21  ?Time:12:50 PM  ? ?Provider Performing: NP student Cathie Beams under direction of Lorin Glass MD ? ?Procedure: Pleural Catheter Insertion w/ Imaging Guidance ([id number]) ? ?Indication(s) ?Effusion ? ?Consent ?Risks of the procedure as well as the alternatives and risks of each were explained to the patient and/or caregiver.  Consent for the procedure was obtained and is signed in the bedside chart ? ?Anesthesia ?Topical only with 1% lidocaine  ? ?Time Out ?Verified patient identification, verified procedure, site/side was marked, verified correct patient position, special equipment/implants available, medications/allergies/relevant history reviewed, required imaging and test results available. ? ?Sterile Technique ?Maximal sterile technique including full sterile barrier drape, hand hygiene, sterile gown, sterile gloves, mask, hair covering, sterile ultrasound probe cover (if used). ? ?Procedure Description ?Ultrasound used to identify appropriate pleural anatomy for placement and overlying skin marked. Area of placement cleaned and draped in sterile fashion.  A 14 French pigtail pleural catheter was placed into the left pleural space using Seldinger technique. Appropriate return of fluid was obtained.  The tube was connected to atrium and placed on -20 cm H2O wall suction. ? ?Complications/Tolerance ?None; patient tolerated the procedure well. ?Chest X-ray is ordered to verify placement. ? ?EBL ?Minimal ? ?Specimen(s) ?fluid: sent for aerobic/AFB cultures for  ? ?

## 2021-04-19 NOTE — Progress Notes (Signed)
Assisted Dr Katrinka Blazing with bedside left chest tube placement.  Time Out completed. ?Pleural fluid sent for testing. ?Awaiting PCXR ?

## 2021-04-19 NOTE — Consult Note (Signed)
? ?NAME:  Lance Marquez, MRN:  [id number], DOB:  Aug 22, 1995, LOS: 1 ?ADMISSION DATE:  04/18/2021, CONSULTATION DATE:  04/19/21 ?REFERRING MD:  TRH, CHIEF COMPLAINT:  chest pain  ? ?History of Present Illness:  ?26 year old man with hx of IVDA, recent admit for septic arthritis presenting with pleurisy found to have L loculated effusion, MRSE bacteremia.  Underwent a thoracentesis at [facility] then sent to [facility] for a TCTS consult.  TCTS recommended consulting PCCM so we are now seeing. ? ?Denies any current chest pain or SOB.  Has never had breathing issues before.   ? ?Pertinent  Medical History  ?IVDA ?Asthma ? ?Significant Hospital Events: ?Including procedures, antibiotic start and stop dates in addition to other pertinent events   ?3/6 admit [place], thora ?3/8 transferred to [facility] ?3/9 TCTS and PCCM consult, pigtail ? ?Interim History / Subjective:  ?consulted ? ?Objective   ?Blood pressure 115/66, pulse 100, temperature 99.4 ?F (37.4 ?C), temperature source Oral, resp. rate 16, height 5\' 9"  (1.753 m), weight 68.5 kg, SpO2 95 %. ?   ?   ? ?Intake/Output Summary (Last 24 hours) at 04/19/2021 1256 ?Last data filed at 04/19/2021 0500 ?Gross per 24 hour  ?Intake 349.86 ml  ?Output --  ?Net 349.86 ml  ? ?Filed Weights  ? 04/19/21 0557  ?Weight: 68.5 kg  ? ? ?Examination: ?General: thin chronically ill appearing man ?HENT: MMM, trachea midline ?Lungs: diminished L, complex pleural space on [place] ?Cardiovascular: RRR, ext warm ?Abdomen: soft, +BS ?Extremities: multiple track marks, tattoos ?Neuro: moves all 4 ext to command ?Psych: anxious, AOx3 ? ?WBC up ? ?Resolved Hospital Problem list   ?N/a ? ?Assessment & Plan:  ?Probable empyema in IVDA patient-  ? ?Will place pigtail and do pleural lytics.  Chest tube to suction.  Studies requested by ID have been sent.  Abx per ID.  Will add some toradol to help with chest wall pain. ? ?Remainder of care per primary ? ?Best Practice (right click and "Reselect all SmartList Selections"  daily)  ?Per primary ? ?Labs   ?CBC: ?Recent Labs  ?Lab 04/16/21 ?1113 04/17/21 ?0933 04/18/21 ?[id number] 04/19/21 ?0336  ?WBC 21.5* 14.1* 14.8* 15.3*  ?NEUTROABS  --   --   --  8.9*  ?HGB 11.9* 11.7* 11.3* 11.7*  ?HCT 36.1* 35.0* 33.4* 35.3*  ?MCV 87.2 85.8 85.2 86.7  ?PLT 404* 452* 517* 530*  ? ? ?Basic Metabolic Panel: ?Recent Labs  ?Lab 04/16/21 ?1113 04/17/21 ?0933 04/19/21 ?0336  ?NA 129* 135 137  ?K 3.7 4.7 3.6  ?CL 97* 102 104  ?CO2 25 25 24   ?GLUCOSE 98 103* 65*  ?BUN 9 7 5*  ?CREATININE 0.58* 0.64 0.75  ?CALCIUM 8.5* 8.3* 8.4*  ?MG  --   --  2.0  ? ?GFR: ?Estimated Creatinine Clearance: 136.8 mL/min (by C-G formula based on SCr of 0.75 mg/dL). ?Recent Labs  ?Lab 04/16/21 ?1113 04/16/21 ?1342 04/17/21 ?0933 04/17/21 ?1641 04/18/21 ?[id number] 04/19/21 ?0336  ?PROCALCITON  --   --   --   --   --  0.10  ?WBC 21.5*  --  14.1*  --  14.8* 15.3*  ?LATICACIDVEN  --  0.9  --  0.9  --   --   ? ? ?Liver Function Tests: ?Recent Labs  ?Lab 04/16/21 ?1113 04/19/21 ?0336  ?AST 25 13*  ?ALT 27 15  ?ALKPHOS 116 82  ?BILITOT 1.0 <0.1*  ?PROT 7.9 7.2  ?ALBUMIN 2.9* 2.2*  ? ?No results for input(s):  LIPASE, AMYLASE in the last 168 hours. ?No results for input(s): AMMONIA in the last 168 hours. ? ?ABG ?No results found for: PHART, PCO2ART, PO2ART, HCO3, TCO2, ACIDBASEDEF, O2SAT  ? ?Coagulation Profile: ?Recent Labs  ?Lab 04/19/21 ?0336  ?INR 1.2  ? ? ?Cardiac Enzymes: ?No results for input(s): CKTOTAL, CKMB, CKMBINDEX, TROPONINI in the last 168 hours. ? ?HbA1C: ?No results found for: HGBA1C ? ?CBG: ?No results for input(s): GLUCAP in the last 168 hours. ? ?Review of Systems:   ? ?Positive Symptoms in bold: ? ?Constitutional fevers, chills, weight loss, fatigue, anorexia, malaise  ?Eyes decreased vision, double vision, eye irritation  ?Ears, Nose, Mouth, Throat sore throat, trouble swallowing, sinus congestion  ?Cardiovascular chest pain, paroxysmal nocturnal dyspnea, lower ext edema, palpitations ?  ?Respiratory SOB, cough, DOE,  hemoptysis, wheezing  ?Gastrointestinal nausea, vomiting, diarrhea  ?Genitourinary burning with urination, trouble urinating  ?Musculoskeletal joint aches, joint swelling, back pain  ?Integumentary  rashes, skin lesions  ?Neurological focal weakness, focal numbness, trouble speaking, headaches  ?Psychiatric depression, anxiety, confusion  ?Endocrine polyuria, polydipsia, cold intolerance, heat intolerance  ?Hematologic abnormal bruising, abnormal bleeding, unexplained nose bleeds  ?Allergic/Immunologic recurrent infections, hives, swollen lymph nodes  ? ? ? ?Past Medical History:  ?He,  has a past medical history of Asthma, C. difficile diarrhea, Diarrhea, Drug abuse and dependence ([place]), Parasites in stool, and Suicidal behavior.  ? ?Surgical History:  ? ?Past Surgical History:  ?Procedure Laterality Date  ? KNEE ARTHROSCOPY Right 03/22/2021  ? Procedure: ARTHROSCOPY KNEE;  Surgeon: Leim Fabry, MD;  Location: [facility] ORS;  Service: Orthopedics;  Laterality: Right;  ?  ? ?Social History:  ? reports that he has been smoking cigarettes. He has never used smokeless tobacco. He reports current alcohol use. He reports current drug use. Drugs: IV and Methamphetamines.  ? ?Family History:  ?His family history is not on file.  ? ?Allergies ?Allergies  ?Allergen Reactions  ? Vancomycin Rash  ?  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  ? ?Home Medications  ?Prior to Admission medications   ?Medication Sig Start Date End Date Taking? Authorizing Provider  ?acetaminophen (TYLENOL) 325 MG tablet Take 2 tablets (650 mg total) by mouth every 6 (six) hours as needed for mild pain, headache or fever. 04/18/21   Lorella Nimrod, MD  ?buprenorphine-naloxone (SUBOXONE) 8-2 mg SUBL SL tablet Place 1 tablet under the tongue 2 (two) times daily. 04/18/21   Lorella Nimrod, MD  ?enoxaparin (LOVENOX) 40 MG/0.4ML injection Inject 0.4 mLs (40 mg total) into the skin daily. 04/18/21   Lorella Nimrod, MD  ?lidocaine (LIDODERM) 5 % Place 1 patch onto the  skin daily. Remove & Discard patch within 12 hours or as directed by MD 04/19/21   Lorella Nimrod, MD  ?linezolid (ZYVOX) 600 MG/300ML IVPB Inject 300 mLs (600 mg total) into the vein every 12 (twelve) hours. 04/18/21   Lorella Nimrod, MD  ?melatonin 5 MG TABS Take 1 tablet (5 mg total) by mouth at bedtime. 04/18/21   Lorella Nimrod, MD  ?piperacillin-tazobactam (ZOSYN) 3.375 GM/50ML IVPB Inject 50 mLs (3.375 g total) into the vein every 8 (eight) hours. 04/18/21   Lorella Nimrod, MD  ?senna-docusate (SENOKOT-S) 8.6-50 MG tablet Take 1 tablet by mouth at bedtime as needed for mild constipation. 03/24/21   Aline August, MD  ?traZODone (DESYREL) 50 MG tablet Take 1 tablet (50 mg total) by mouth at bedtime as needed for sleep. 04/18/21   Lorella Nimrod, MD  ?  ? ?

## 2021-04-19 NOTE — Assessment & Plan Note (Addendum)
IV drug abuse and dependence.  History of IV heroin usage.   on Suboxone protocol.  No active withdrawal symptoms at this time.  Continue Toradol for pain relief at this time.

## 2021-04-19 NOTE — Progress Notes (Signed)
CSW received consult for substance use resources for patient and community resources for patient. CSW spoke with patient and provided patient with outpatient substance use treatment services resources patient accepted. CSW also offered patient [place] country Scientist, water quality resources, patient accepted. All questions answered. No further questions reported at this time. ?

## 2021-04-19 NOTE — Progress Notes (Signed)
Patient complained of 9/10 back pain, not resolving with Toradol. Notified critical care and attending. Chest tube output 550 red clear drainage, critical care PA aware and comfortable with output. Orders received for dilaudid and given.  ?

## 2021-04-19 NOTE — Assessment & Plan Note (Addendum)
Complicated pneumonia with loculated empyema.   PCCM and infectious disease on board.  Currently on Unasyn and Zyvox.  Status post chest tube placement with instillation of fibrinolytics x3.   Pleural fluid culture with MRSA.  ID recommends doxycycline at this time.  AFB smear negative.  PCCM on board and patient is on chest tube.  CT scan chest done yesterday showed some small effusion.  Further plan for chest tube versus CT surgery intervention as per pulmonary.

## 2021-04-19 NOTE — Hospital Course (Addendum)
Lance Marquez is a 26 y.o. male with past medical history of septic arthritis, IV drug abuse presented to hospital with left lateral chest wall pain for 3 days which was severe in intensity with some fever chills and productive yellowish sputum.  In the ED, patient had a chest x-ray which showed large left pleural effusion with adjacent consolidation.  CT surgery was consulted and subsequently pulmonary was consulted.  Patient underwent chest tube placement by pulmonary.  Pulmonary managing chest tube placement.  Patient has undergone instillation of fibrinolytics x3. Infectious disease was also consulted for antibiotic management.  Patient was continued on Suboxone protocol.  Pleural smear negative for AFB but positive for MRSA.  ID recommending doxycycline.  Pulmonary following the patient regarding chest tube management.

## 2021-04-19 NOTE — Assessment & Plan Note (Signed)
#)   Left lower lobe pneumonia: Imaging at [facility] on 04/16/2021 demonstrated left lower lobe airspace opacities adjacent to aforementioned left pleural effusion, concerning for pneumonia with parapneumonic effusion.  As the patient had been hospitalized 1 month prior for septic arthritis at which time he received IV antibiotics, he is at increased risk for drug-resistant bacteria.  Additionally, in the setting of his history of IV drug abuse, he is also increased risk for MRSA.  Consequently, and in the setting of history of allergy to vancomycin, infectious disease was consulted at [facility], with ensuing recommendations for Zosyn as well as Zyvox, which the patient was receiving throughout his course at [facility].  Of note, and in tandem with infectious disease consultation, presenting blood cultures x2 were notable for 1 out of 4 bottles positive for methicillin resistant Staph epidermidis, which was felt to represent a contaminant.  Pneumonia work-up has also been notable for strep pneumonia urine antigen negative.  MRSA PCR positive, pending further continuation of Zyvox.  COVID-19/influenza negative.  Legionella urine antigen pending. ?  ?  ?Plan: Continue Zosyn and Zyvox, as above.  Repeat chest x-ray in the morning, as above.  Check procalcitonin.  Follow-up results of Legionella urine antigen. ?

## 2021-04-19 NOTE — Progress Notes (Signed)
Initial Nutrition Assessment ? ?DOCUMENTATION CODES:  ? ?Severe malnutrition in context of social or environmental circumstances ? ?INTERVENTION:  ?Ensure Enlive po TID, each supplement provides 350 kcal and 20 grams of protein. ?Double protein portions with meals ?MVI with minerals daily  ? ? ?NUTRITION DIAGNOSIS:  ? ?Severe Malnutrition related to social / environmental circumstances (IV drug abuse including IV heroin use) as evidenced by percent weight loss, severe fat depletion, severe muscle depletion (12% weight loss within the last one month). ? ? ?GOAL:  ? ?Patient will meet greater than or equal to 90% of their needs ? ? ?MONITOR:  ? ?PO intake, Supplement acceptance, Labs, Weight trends ? ?REASON FOR ASSESSMENT:  ? ?Consult ?Assessment of nutrition requirement/status ? ?ASSESSMENT:  ? ?Pt is a 26 year old male with medical history significant of IV drug abuse, including IV heroin use, recent hospitalization for septic arthritis of the right knee who was transferred from [facility] to [facility] for further evaluation and management of left pleural effusion who intitially presented to [facility] on 04/16/2021 with complaint of left-sided chest pain. ? ?04/16/21 - left lower thoracentesis with removal of 670 cc pleural fluid  ? ?Pt is currently on a regular diet with no meal completions documented at this time.  ? ?Met with pt at bedside. Per pt, pt endorses a good appetite today and states that the last thing that he had to eat was a [place] sandwich last night upon arrival to hospital. Pt denies any nausea, vomiting or constipation at this time. Prior to admission, pt reports poor PO intake due to using drugs. Pt reports that when he does eat, he will have food items such as pizza, hot dogs, hamburgers, and ice cream. Per pt, he lives with his father and grandfather. Pt unsure of usual body weight but suspects that he has lost weight over the past few weeks and/or months due to poor PO intake.  ? ?Discussed with pt the importance  of adequate PO intake for healing and to meet nutritional needs. Discussed oral nutrition supplementation with pt and pt states that he is willing to try Ensure (vanilla-flavored). Discussed MVI supplementation with pt and pt agreeable to taking MVI. Encouraged pt to eat consistent meals throughout the day and pt agreeable to double protein portions with meals as pt reports that if he has food in front of him, he will eat it.  ? ?Current wt: 68.5 kg  ?Per pt's weight history, pt's weight on 03/22/21 was 78 kg. From this time, pt has experienced a 12% weight loss within the last one month, which is significant for time frame.  ? ?Labs reviewed and include: Glucose: 65, Hemoglobin: 11.7  ? ?Medications reviewed and include:  ? buprenorphine-naloxone  1 tablet Sublingual BID  ? ?Continuous Infusions: ? ampicillin-sulbactam (UNASYN) IV 3 g (04/19/21 1145)  ? linezolid (ZYVOX) IV 600 mg (04/19/21 1145)  ? ? ?NUTRITION - FOCUSED PHYSICAL EXAM: ? ?Flowsheet Row Most Recent Value  ?Orbital Region Severe depletion  ?Upper Arm Region Moderate depletion  ?Thoracic and Lumbar Region Severe depletion  ?Buccal Region Severe depletion  ?Temple Region Moderate depletion  ?Clavicle Bone Region Moderate depletion  ?Clavicle and Acromion Bone Region Moderate depletion  ?Scapular Bone Region Severe depletion  ?Dorsal Hand Mild depletion  ?Patellar Region Moderate depletion  ?Anterior Thigh Region Moderate depletion  ?Posterior Calf Region Moderate depletion  ?Edema (RD Assessment) None  ?Hair Reviewed  ?Eyes Reviewed  ?Mouth Reviewed  ?Skin Reviewed  ?Nails Reviewed  ? ?  ? ? ?  Diet Order:   ?Diet Order   ? ?       ?  Diet regular Room service appropriate? Yes; Fluid consistency: Thin  Diet effective now       ?  ? ?  ?  ? ?  ? ? ?EDUCATION NEEDS:  ? ?Education needs have been addressed ? ?Skin:  Skin Assessment: Reviewed RN Assessment ? ?Last BM:  3/7 ? ?Height:  ? ?Ht Readings from Last 1 Encounters:  ?04/19/21 _0  (1.753 m)   ? ? ?Weight:  ? ?Wt Readings from Last 1 Encounters:  ?04/19/21 68.5 kg  ? ? ?BMI:  Body mass index is 22.3 kg/m?. ? ?Estimated Nutritional Needs:  ? ?Kcal:  2200 - 2400 ? ?Protein:  110 - 125 grams ? ?Fluid:  >/= 2.2 L ? ? ? ?Maryruth Hancock, Dietetic Intern ?04/19/2021 3:58 PM ?

## 2021-04-19 NOTE — Progress Notes (Signed)
Attempted to complete admission assessment with patient and patient refused. Stated he is not feeling well and not really wanted to answer questions.  ?

## 2021-04-19 NOTE — Assessment & Plan Note (Addendum)
Improved, sodium at 138 ?

## 2021-04-20 ENCOUNTER — Inpatient Hospital Stay (HOSPITAL_COMMUNITY): Payer: Self-pay

## 2021-04-20 ENCOUNTER — Other Ambulatory Visit: Payer: Self-pay

## 2021-04-20 DIAGNOSIS — F1910 Other psychoactive substance abuse, uncomplicated: Secondary | ICD-10-CM

## 2021-04-20 DIAGNOSIS — F1990 Other psychoactive substance use, unspecified, uncomplicated: Secondary | ICD-10-CM

## 2021-04-20 DIAGNOSIS — E43 Unspecified severe protein-calorie malnutrition: Secondary | ICD-10-CM | POA: Insufficient documentation

## 2021-04-20 LAB — BASIC METABOLIC PANEL
Anion gap: 9 (ref 5–15)
BUN: 6 mg/dL (ref 6–20)
CO2: 25 mmol/L (ref 22–32)
Calcium: 8.2 mg/dL — ABNORMAL LOW (ref 8.9–10.3)
Chloride: 99 mmol/L (ref 98–111)
Creatinine, Ser: 0.64 mg/dL (ref 0.61–1.24)
GFR, Estimated: >60 mL/min (ref >60)
Glucose, Bld: 113 mg/dL — ABNORMAL HIGH (ref 70–99)
Potassium: 3.6 mmol/L (ref 3.5–5.1)
Sodium: 133 mmol/L — ABNORMAL LOW (ref 135–145)

## 2021-04-20 LAB — CBC
HCT: 37.6 % — ABNORMAL LOW (ref 39.0–52.0)
Hemoglobin: 12.3 g/dL — ABNORMAL LOW (ref 13.0–17.0)
MCH: 28.5 pg (ref 26.0–34.0)
MCHC: 32.7 g/dL (ref 30.0–36.0)
MCV: 87 fL (ref 80.0–100.0)
Platelets: 616 10*3/uL — ABNORMAL HIGH (ref 150–400)
RBC: 4.32 MIL/uL (ref 4.22–5.81)
RDW: 15.2 % (ref 11.5–15.5)
WBC: 16.7 10*3/uL — ABNORMAL HIGH (ref 4.0–10.5)
nRBC: 0 % (ref 0.0–0.2)

## 2021-04-20 LAB — BODY FLUID CULTURE W GRAM STAIN: Culture: NO GROWTH

## 2021-04-20 LAB — ACID FAST SMEAR (AFB, MYCOBACTERIA): Acid Fast Smear: NEGATIVE

## 2021-04-20 LAB — PHOSPHORUS: Phosphorus: 4.2 mg/dL (ref 2.5–4.6)

## 2021-04-20 LAB — MAGNESIUM: Magnesium: 2.1 mg/dL (ref 1.7–2.4)

## 2021-04-20 MED ORDER — SODIUM CHLORIDE 0.9 % IV SOLN: INTRAVENOUS | Status: DC | PRN

## 2021-04-20 MED ORDER — HYDROMORPHONE HCL 1 MG/ML IJ SOLN
1.0000 mg | Freq: Once | INTRAMUSCULAR | Status: AC
  Administered 2021-04-20: 1 mg via INTRAVENOUS
  Filled 2021-04-20: qty 1

## 2021-04-20 MED ORDER — SODIUM CHLORIDE (PF) 0.9 % IJ SOLN
10.0000 mg | Freq: Once | INTRAMUSCULAR | Status: AC
  Administered 2021-04-20: 10 mg via INTRAPLEURAL
  Filled 2021-04-20: qty 10

## 2021-04-20 MED ORDER — TRAZODONE HCL 50 MG PO TABS
50.0000 mg | ORAL_TABLET | Freq: Every day | ORAL | Status: DC
  Administered 2021-04-20 – 2021-04-23 (×4): 50 mg via ORAL
  Filled 2021-04-20 (×4): qty 1

## 2021-04-20 MED ORDER — STERILE WATER FOR INJECTION IJ SOLN
5.0000 mg | Freq: Once | RESPIRATORY_TRACT | Status: AC
  Administered 2021-04-20: 5 mg via INTRAPLEURAL
  Filled 2021-04-20: qty 5

## 2021-04-20 NOTE — Progress Notes (Addendum)
? ?NAME:  Kamonte Mcmichen, MRN:  [id number], DOB:  12-30-1995, LOS: 2 ?ADMISSION DATE:  04/18/2021, CONSULTATION DATE:  04/19/21 ?REFERRING MD:  TRH, CHIEF COMPLAINT:  chest pain  ? ?History of Present Illness:  ?26 year old man with hx of IVDA, recent admit for septic arthritis presenting with pleurisy found to have L loculated effusion, MRSE bacteremia.  Underwent a thoracentesis at [facility] then sent to [facility] for a TCTS consult.  TCTS recommended consulting PCCM so we are now seeing. ? ?Denies any current chest pain or SOB.  Has never had breathing issues before.   ? ?Pertinent  Medical History  ?IVDA ?Asthma ? ?Significant Hospital Events: ?Including procedures, antibiotic start and stop dates in addition to other pertinent events   ?3/6 admit [facility], thora ?3/8 transferred to [facility] ?3/9 TCTS and PCCM consult, pigtail. Dose 1 tpa/dornase ? ?Interim History / Subjective:  ? ?Chest tube placed 3/9 and given 1st dose tpa dornase  ?  ? ?NAEO  ?CXR this morning with continued interval improvement in volume of L pleural effusion. 800 ml output since insertion  ? ? ?C/o intermittent moderate-severe pain, feels that toradol has been the most helpful.  ?Wishes he could sleep a bit better  ?Objective   ?Blood pressure 105/65, pulse 93, temperature 99.4 ?F (37.4 ?C), temperature source Oral, resp. rate 12, height 5\' 9"  (1.753 m), weight 68.5 kg, SpO2 98 %. ?   ?   ? ?Intake/Output Summary (Last 24 hours) at 04/20/2021 0900 ?Last data filed at 04/20/2021 0600 ?Gross per 24 hour  ?Intake 1203.44 ml  ?Output 800 ml  ?Net 403.44 ml  ? ?Filed Weights  ? 04/19/21 0557  ?Weight: 68.5 kg  ? ? ?Examination: ?General: Thin, chronically ill appearing young adult M NAD ?HENT: NCAT pink mm  ?Lungs: CTAb. 06/19/21 serosanguinous output in [place]. Chest tube insertion site c/d/i ?Cardiovascular: rrr s1s2 cap refill brisk  ?Abdomen: thin, soft ndnt  ?Extremities: Healed tattoos. Scarred track marks. Scattered scabs.  ?Neuro: Aox4 following commands   ?Psych: calm, cooperative.  ? ?Resolved Hospital Problem list   ?N/a ? ?Assessment & Plan:  ? ?Left sided loculated effusion -- probably empyema ?Hx IVDA  ?Hx septic arthritis  ?-chest tube placed 3/9 ?-1st dose tpa/dornase 3/9  ?-CXR 3/10 with interval decr in sz of L effusion.  ?P ?-linezolid + unasyn (was on linezolid + pip/tazo)  ?-cont PRN toradol for pain ?-repeat tpa/dornase 3/10 for dose 2  ?-monitor pigtail output, routine pigtail care ?-AM CXR  ? ?Polysubstance abuse ?-82yr hx IV heroin and meth use ?-feels suboxone has been very helpful inpt and is motivated to figure out how to stop using. Has been using less fq since leaving prison compared to before prison ?-lives with dad who is a major support ?P ?-cont suboxone.  ?-when close to dispo, help coordinate OP resources  ?-scheduling trazodone to sleep  ? ? ?Remainder of care per primary ? ? ?Best Practice (right click and "Reselect all SmartList Selections" daily)  ?Per primary ? ?Labs   ?CBC: ?Recent Labs  ?Lab 04/16/21 ?1113 04/17/21 ?0933 04/18/21 ?06/18/21 04/19/21 ?06/19/21 04/20/21 ?0209  ?WBC 21.5* 14.1* 14.8* 15.3* 16.7*  ?NEUTROABS  --   --   --  8.9*  --   ?HGB 11.9* 11.7* 11.3* 11.7* 12.3*  ?HCT 36.1* 35.0* 33.4* 35.3* 37.6*  ?MCV 87.2 85.8 85.2 86.7 87.0  ?PLT 404* 452* 517* 530* 616*  ? ? ?Basic Metabolic Panel: ?Recent Labs  ?Lab 04/16/21 ?1113 04/17/21 ?  0263 04/19/21 ?7858 04/20/21 ?0209  ?NA 129* 135 137 133*  ?K 3.7 4.7 3.6 3.6  ?CL 97* 102 104 99  ?CO2 25 25 24 25   ?GLUCOSE 98 103* 65* 113*  ?BUN 9 7 5* 6  ?CREATININE 0.58* 0.64 0.75 0.64  ?CALCIUM 8.5* 8.3* 8.4* 8.2*  ?MG  --   --  2.0 2.1  ?PHOS  --   --   --  4.2  ? ?GFR: ?Estimated Creatinine Clearance: 136.8 mL/min (by C-G formula based on SCr of 0.64 mg/dL). ?Recent Labs  ?Lab 04/16/21 ?1342 04/17/21 ?0933 04/17/21 ?1641 04/18/21 ?06/18/21 04/19/21 ?06/19/21 04/20/21 ?0209  ?PROCALCITON  --   --   --   --  0.10  --   ?WBC  --  14.1*  --  14.8* 15.3* 16.7*  ?LATICACIDVEN 0.9  --  0.9  --   --    --   ? ? ?Liver Function Tests: ?Recent Labs  ?Lab 04/16/21 ?1113 04/19/21 ?0336  ?AST 25 13*  ?ALT 27 15  ?ALKPHOS 116 82  ?BILITOT 1.0 <0.1*  ?PROT 7.9 7.2  ?ALBUMIN 2.9* 2.2*  ? ?No results for input(s): LIPASE, AMYLASE in the last 168 hours. ?No results for input(s): AMMONIA in the last 168 hours. ? ?ABG ?No results found for: PHART, PCO2ART, PO2ART, HCO3, TCO2, ACIDBASEDEF, O2SAT  ? ?Coagulation Profile: ?Recent Labs  ?Lab 04/19/21 ?0336  ?INR 1.2  ? ? ?Cardiac Enzymes: ?No results for input(s): CKTOTAL, CKMB, CKMBINDEX, TROPONINI in the last 168 hours. ? ?HbA1C: ?No results found for: HGBA1C ? ?CBG: ?No results for input(s): GLUCAP in the last 168 hours. ? ?06/19/21 MSN, AGACNP-BC ?[facility] ?Amion for pager  ?04/20/2021, 9:00 AM ? ? ?Pulm Critical care attending: ? ? ?This is a 26 year old gentleman with history of IV drug use, septic arthritis, have a loculated left-sided pleural effusion with MRSE bacteremia.  Had chest tube placed with drainage.  Has about 800 cc out overnight. Planning for repeat tPA dornase today. ? ?BP (!) 112/55 (BP Location: Left Arm)   Pulse 98   Temp 98.9 ?F (37.2 ?C) (Oral)   Resp 18   Ht 5\' 9"  (1.753 m)   Wt 68.5 kg   SpO2 98%   BMI 22.30 kg/m?   ?General: Young gentleman comfortable in bed HEENT: Tracking appropriately, alert, NCAT ?Heart: Regular rhythm S1-S2 ?Lungs: Clear to auscultation bilaterally, diminished left chest ?Abdomen: Soft nontender ? ?Labs: Reviewed ? ?Assessment: ?Left-sided loculated effusion, likely empyema ?History of IV drug use ?History of septic arthritis ?History of MRSE bacteremia ?Polysubstance abuse history ? ?Plan: ?Chest tube drainage in place ?Continue repeat fibrinolytics today. ?Continue antibiotics per infectious disease. ? ?We will continue to monitor and follow his chest tube output. ? ?26, DO ?[facility] ?04/20/2021 4:47 PM   ? ?

## 2021-04-20 NOTE — Procedures (Signed)
Pleural Fibrinolytic Administration Procedure Note ? ?Lance Marquez  ?[id number]  ?04-19-1995 ? ?Date:04/20/21  ?Time:2:11 PM  ? ?Provider Performing:Roshanna Cimino E Shinita Mac  ? ?Procedure: Pleural Fibrinolysis Subsequent day [id number]) ? ?Indication(s) ?Fibrinolysis of complicated pleural effusion ? ?Consent ?Risks of the procedure as well as the alternatives and risks of each were explained to the patient and/or caregiver.  Consent for the procedure was obtained. ? ? ?Anesthesia ?None ? ? ?Time Out ?Verified patient identification, verified procedure, site/side was marked, verified correct patient position, special equipment/implants available, medications/allergies/relevant history reviewed, required imaging and test results available. ? ? ?Sterile Technique ?Hand hygiene, gloves ? ?Procedure Description ?Existing pleural catheter was cleaned and accessed in sterile manner.  10mg  of tPA in 30cc of saline and 5mg  of dornase in 30cc of sterile water were injected into pleural space using existing pleural catheter at 14:10.  Catheter will be clamped for 1 hour and then placed back to suction at 15:10  ? ? ?Complications/Tolerance ?None; patient tolerated the procedure well. ? ?EBL ?None ? ? ?Specimen(s) ?None  ? ? ?Eliseo Gum MSN, AGACNP-BC ?[facility] ?Amion for pager ?04/20/2021, 2:11 PM ? ?

## 2021-04-20 NOTE — Progress Notes (Signed)
[facility]   Reason for visit: Follow up on empyema  Interval History: transferred here to [facility] yesterday; WBC trending down to 15.3, remains afebrile.  No rash or diarrhea  Day 5 linezolid  Day 2 ampicillin/sulbactam  Physical Exam: Constitutional:  Vitals:   04/20/21 0511 04/20/21 0938  BP: 105/65 113/64  Pulse: 93 91  Resp: 12 20  Temp: 99.4 F (37.4 C) 98.9 F (37.2 C)  SpO2: 98%   He is in nad Respiratory: normal respiratory effort Cardiovascular: RRR   Review of Systems: Constitutional: negative for fever  Lab Results  Component Value Date   WBC 16.7 (H) 04/20/2021   HGB 12.3 (L) 04/20/2021   HCT 37.6 (L) 04/20/2021   MCV 87.0 04/20/2021   PLT 616 (H) 04/20/2021    Lab Results  Component Value Date   CREATININE 0.64 04/20/2021   BUN 6 04/20/2021   NA 133 (L) 04/20/2021   K 3.6 04/20/2021   CL 99 04/20/2021   CO2 25 04/20/2021    Lab Results  Component Value Date   ALT 15 04/19/2021   AST 13 (L) 04/19/2021   ALKPHOS 82 04/19/2021     Microbiology: Recent Results (from the past 240 hour(s))  Blood culture (routine x 2)     Status: Abnormal   Collection Time: 04/16/21  1:35 PM   Specimen: BLOOD  Result Value Ref Range Status   Specimen Description   Final    BLOOD BLOOD LEFT FOREARM Performed at [facility], [address], [place], [place] [address]    Special Requests   Final    BOTTLES DRAWN AEROBIC AND ANAEROBIC Blood Culture adequate volume Performed at [facility], [address] [place], [place] [address]    Culture  Setup Time   Final    GRAM POSITIVE COCCI ANAEROBIC BOTTLE ONLY CRITICAL RESULT CALLED TO, READ BACK BY AND VERIFIED WITH: DEVAN MITCHELL 04/17/21 1442 MW GRAM STAIN REVIEWED-AGREE WITH RESULT    Culture (A)  Final    STAPHYLOCOCCUS EPIDERMIDIS THE SIGNIFICANCE OF ISOLATING THIS ORGANISM FROM A SINGLE SET OF BLOOD CULTURES WHEN MULTIPLE SETS ARE DRAWN IS UNCERTAIN.  PLEASE NOTIFY THE MICROBIOLOGY DEPARTMENT WITHIN ONE WEEK IF SPECIATION AND SENSITIVITIES ARE REQUIRED. Performed at [facility], [address] [place], [place] [address]    Report Status 04/19/2021 FINAL  Final  Blood Culture ID Panel (Reflexed)     Status: Abnormal   Collection Time: 04/16/21  1:35 PM  Result Value Ref Range Status   Enterococcus faecalis NOT DETECTED NOT DETECTED Final   Enterococcus Faecium NOT DETECTED NOT DETECTED Final   Listeria monocytogenes NOT DETECTED NOT DETECTED Final   Staphylococcus species DETECTED (A) NOT DETECTED Final    Comment: CRITICAL RESULT CALLED TO, READ BACK BY AND VERIFIED WITH: DEVAN MITCHELL 04/17/21 1143 MW    Staphylococcus aureus (BCID) NOT DETECTED NOT DETECTED Final   Staphylococcus epidermidis DETECTED (A) NOT DETECTED Final    Comment: Methicillin (oxacillin) resistant coagulase negative staphylococcus. Possible blood culture contaminant (unless isolated from more than one blood culture draw or clinical case suggests pathogenicity). No antibiotic treatment is indicated for blood  culture contaminants. CRITICAL RESULT CALLED TO, READ BACK BY AND VERIFIED WITH: [facility] MITCHELL 04/17/21 1143 MW    Staphylococcus lugdunensis NOT DETECTED NOT DETECTED Final   Streptococcus species NOT DETECTED NOT DETECTED Final   Streptococcus agalactiae NOT DETECTED NOT DETECTED Final   Streptococcus pneumoniae NOT DETECTED NOT  DETECTED Final   Streptococcus pyogenes NOT DETECTED NOT DETECTED Final   A.calcoaceticus-baumannii NOT DETECTED NOT DETECTED Final   Bacteroides fragilis NOT DETECTED NOT DETECTED Final   Enterobacterales NOT DETECTED NOT DETECTED Final   Enterobacter cloacae complex NOT DETECTED NOT DETECTED Final   Escherichia coli NOT DETECTED NOT DETECTED Final   Klebsiella aerogenes NOT DETECTED NOT DETECTED Final   Klebsiella oxytoca NOT DETECTED NOT DETECTED Final   Klebsiella pneumoniae NOT DETECTED NOT DETECTED Final   Proteus  species NOT DETECTED NOT DETECTED Final   Salmonella species NOT DETECTED NOT DETECTED Final   Serratia marcescens NOT DETECTED NOT DETECTED Final   Haemophilus influenzae NOT DETECTED NOT DETECTED Final   Neisseria meningitidis NOT DETECTED NOT DETECTED Final   Pseudomonas aeruginosa NOT DETECTED NOT DETECTED Final   Stenotrophomonas maltophilia NOT DETECTED NOT DETECTED Final   Candida albicans NOT DETECTED NOT DETECTED Final   Candida auris NOT DETECTED NOT DETECTED Final   Candida glabrata NOT DETECTED NOT DETECTED Final   Candida krusei NOT DETECTED NOT DETECTED Final   Candida parapsilosis NOT DETECTED NOT DETECTED Final   Candida tropicalis NOT DETECTED NOT DETECTED Final   Cryptococcus neoformans/gattii NOT DETECTED NOT DETECTED Final   Methicillin resistance mecA/C DETECTED (A) NOT DETECTED Final    Comment: CRITICAL RESULT CALLED TO, READ BACK BY AND VERIFIED WITH: Lu Duffel 04/17/21 1143 MW Performed at [facility], [address] [place], [place] [address]   Blood culture (routine x 2)     Status: None (Preliminary result)   Collection Time: 04/16/21  1:42 PM   Specimen: BLOOD  Result Value Ref Range Status   Specimen Description BLOOD BLOOD RIGHT HAND  Final   Special Requests   Final    BOTTLES DRAWN AEROBIC AND ANAEROBIC Blood Culture adequate volume   Culture   Final    NO GROWTH 4 DAYS Performed at [facility], [address] [place], [place] [address]    Report Status PENDING  Incomplete  Resp Panel by RT-PCR (Flu A&B, Covid) Nasopharyngeal Swab     Status: None   Collection Time: 04/16/21  2:05 PM   Specimen: Nasopharyngeal Swab; Nasopharyngeal(NP) swabs in vial transport medium  Result Value Ref Range Status   SARS Coronavirus 2 by RT PCR NEGATIVE NEGATIVE Final    Comment: (NOTE) SARS-CoV-2 target nucleic acids are NOT DETECTED.  The SARS-CoV-2 RNA is generally detectable in upper respiratory specimens during the acute phase of  infection. The lowest concentration of SARS-CoV-2 viral copies this assay can detect is 138 copies/mL. A negative result does not preclude SARS-Cov-2 infection and should not be used as the sole basis for treatment or other patient management decisions. A negative result may occur with  improper specimen collection/handling, submission of specimen other than nasopharyngeal swab, presence of viral mutation(s) within the areas targeted by this assay, and inadequate number of viral copies(<138 copies/mL). A negative result must be combined with clinical observations, patient history, and epidemiological information. The expected result is Negative.  Fact Sheet for Patients:  EntrepreneurPulse.com.au  Fact Sheet for Healthcare Providers:  IncredibleEmployment.be  This test is no t yet approved or cleared by the [place] FDA and  has been authorized for detection and/or diagnosis of SARS-CoV-2 by FDA under an Emergency Use Authorization (EUA). This EUA will remain  in effect (meaning this test can be used) for the duration of the COVID-19 declaration under Section 564(b)(1) of the Act, 21 U.S.C.section 360bbb-3(b)(1), unless the authorization  is terminated  or revoked sooner.       Influenza A by PCR NEGATIVE NEGATIVE Final   Influenza B by PCR NEGATIVE NEGATIVE Final    Comment: (NOTE) The Xpert Xpress SARS-CoV-2/FLU/RSV plus assay is intended as an aid in the diagnosis of influenza from Nasopharyngeal swab specimens and should not be used as a sole basis for treatment. Nasal washings and aspirates are unacceptable for Xpert Xpress SARS-CoV-2/FLU/RSV testing.  Fact Sheet for Patients: EntrepreneurPulse.com.au  Fact Sheet for Healthcare Providers: IncredibleEmployment.be  This test is not yet approved or cleared by the [place] FDA and has been authorized for detection and/or diagnosis of SARS-CoV-2  by FDA under an Emergency Use Authorization (EUA). This EUA will remain in effect (meaning this test can be used) for the duration of the COVID-19 declaration under Section 564(b)(1) of the Act, 21 U.S.C. section 360bbb-3(b)(1), unless the authorization is terminated or revoked.  Performed at [facility], [address], [place], [place] [address]   Body fluid culture w Gram Stain     Status: None   Collection Time: 04/16/21  3:55 PM   Specimen: PATH Cytology Pleural fluid  Result Value Ref Range Status   Specimen Description   Final    PLEURAL Performed at [facility], [address], [place], [place] [address]    Special Requests   Final    NONE Performed at [facility], [address] [place], [place] [address]    Gram Stain RARE WBC SEEN NO ORGANISMS SEEN   Final   Culture   Final    NO GROWTH Performed at [facility], [place] [address] [place], [place] [address]    Report Status 04/20/2021 FINAL  Final  MT-RIF NAA W Cult, Non-Sputum     Status: None (Preliminary result)   Collection Time: 04/16/21  3:55 PM   Specimen: PATH Cytology Pleural fluid  Result Value Ref Range Status   Source PLEURAL  Final    Comment: Performed at [facility], [address], [place], [place] [address]   Specimen Source PENDING  Incomplete   M tuberculosis complex PENDING  Incomplete   Rifampin PENDING  Incomplete   AFB Specimen Processing Concentration  Final    Comment: (NOTE) Performed At: [facility] [place], [place] [id number] Rush Farmer MD [id number]    Acid Fast Culture PENDING  Incomplete  MRSA Next Gen by PCR, Nasal     Status: Abnormal   Collection Time: 04/17/21  9:43 AM   Specimen: Nasal Mucosa; Nasal Swab  Result Value Ref Range Status   MRSA by PCR Next Gen DETECTED (A) NOT DETECTED Final    Comment: RESULT CALLED TO, READ BACK BY AND VERIFIED WITH: Zonia Kief, RN 04/17/21 1102  MW (NOTE) The GeneXpert MRSA Assay (FDA approved for NASAL specimens only), is one component of a comprehensive MRSA colonization surveillance program. It is not intended to diagnose MRSA infection nor to guide or monitor treatment for MRSA infections. Test performance is not FDA approved in patients less than 19 years old. Performed at [facility], [address] [place], [place] [address]   Aerobic/Anaerobic Culture w Gram Stain (surgical/deep wound)     Status: None (Preliminary result)   Collection Time: 04/19/21 11:00 AM   Specimen: Pleura  Result Value Ref Range Status   Specimen Description PLEURAL FLUID  Final   Special Requests Normal  Final   Gram Stain   Final  WBC PRESENT, PREDOMINANTLY PMN NO ORGANISMS SEEN CYTOSPIN SMEAR    Culture   Final    NO GROWTH < 24 HOURS Performed at [facility] [address] [place], [place] [address]    Report Status PENDING  Incomplete    Impression/Plan:  1. Empyema - now s/p fibrinolytic therapy by pulmonary about 800 mL of output since chest tube insertion.  No positive cultures to date.   On empiric antibiotics and will continue.   2.  Medication monitoring - with above antibiotics, will monitor platelets, WBC and BMP. Stable today and will continue to monitor.   3. Polysubstance abuse - long history of heroin and methamphetamine use and motivated to quit.  On suboxone.

## 2021-04-20 NOTE — Progress Notes (Signed)
?PROGRESS NOTE ? ? ? ?Lance Marquez  ZOX:[id number] DOB: 04-19-1995 DOA: 04/18/2021 ?PCP: Pcp, No  ? ? ?Brief Narrative:  ?Lance Marquez is a 26 y.o. male with past medical history of septic arthritis, IV drug abuse presented to hospital with left lateral chest wall pain for 3 days which was severe in intensity with some fever chills and productive yellowish sputum.  In the ED, patient had a chest x-ray which showed large left pleural effusion with adjacent consolidation.  CT surgery was consulted and subsequently pulmonary was consulted.  Patient underwent chest tube placement by pulmonary.  Infectious disease was also consulted for antibiotic management.  Patient was continued on Suboxone protocol.  Patient is currently on isolation protocol for concerns of TB. Pleural fluid culture negative so far.     ? ?  ?Assessment and Plan: ?* Pleural effusion on left ?Complicated pneumonia with loculated empyema. Status post diagnostic thoracentesis on 04/16/20[facility] at [facility], exudative in nature with elevated LDH and glucose of 34..  Suspected parapneumonic effusion with loculations so the [facility] was [facility].    1/4 blood cultures with MRSE likely contaminant.  ID  On board.  Continue Unasyn and Zyvox for now.  Status post pigtail catheter placement 3 9 with instillation of tPA/dornase.  Pleural fluid cultures negative in 24 hours.  AFB smear and culture pending at this time.  Further management as per pulmonary and infectious disease..  Plan for chest x-ray in a.m. patient complains of severe chest wall pain.  Received Dilaudid but is on Suboxone..  On Toradol for pain. ? ?Hyponatremia ?Mild.    Received IV fluids.  Likely secondary to poor oral intake. ? ?Opioid withdrawal (HCC) ?IV drug abuse and dependence.  History of IV heroin usage.   on Suboxone protocol.  Monitor for withdrawal symptoms. ? ?Unspecified protein-calorie malnutrition (HCC) ? ?Dietitian consult.  Encourage oral  nutrition. ? ? ? ? DVT prophylaxis: SCDs Start: 04/18/21 1929 ? ? ?Code Status:   ?  Code Status: Full Code ? ?Disposition: Home ? ?Status is: Inpatient ? ?Remains inpatient appropriate because: Complicated pleural effusion, status post chest tube placement, IV antibiotic. ? ? Family Communication:  ?Spoke with the patient at bedside ? ?Consultants:  ?Infectious disease ?Pulmonary ? ?Procedures:  ?Chest tube  insertion with imaging guidance on 04/19/2021.   ?Fibrinolysis of complicated pleural effusion on 04/19/2021. ? ?Antimicrobials:  ?Unasyn and linezolid ? ?Anti-infectives (From admission, onward)  ? ? Start     Dose/Rate Route Frequency Ordered Stop  ? 04/19/21 1030  Ampicillin-Sulbactam (UNASYN) 3 g in sodium chloride 0.9 % 100 mL IVPB       ? 3 g ?200 mL/hr over 30 Minutes Intravenous Every 6 hours 04/19/21 0935    ? 04/18/21 2200  linezolid (ZYVOX) IVPB 600 mg       ?Note to Pharmacy: (has been on Zyvox per ID recs during [facility] leading up to today's [facility]; documented allergy to Vanc)  ? 600 mg ?300 mL/hr over 60 Minutes Intravenous Every 12 hours 04/18/21 1959    ? 04/18/21 2200  piperacillin-tazobactam (ZOSYN) IVPB 3.375 g  Status:  Discontinued       ? 3.375 g ?12.5 mL/hr over 240 Minutes Intravenous Every 8 hours 04/18/21 2035 04/19/21 0935  ? ?  ? ?Subjective: ?Today, patient was seen and examined at bedside.  Complains of pain over the chest wall and difficulty sleeping at night.  Denies dyspnea, fever,  chills or rigor.  ? ?Objective: ?Vitals:  ? 04/19/21 2017 04/20/21 0031 04/20/21 9702 04/20/21 6378  ?BP: 126/68 (!) 103/58 105/65 113/64  ?Pulse: 93 86 93 91  ?Resp: 16 17 12 20   ?Temp: 98.1 ?F (36.7 ?C) 97.9 ?F (36.6 ?C) 99.4 ?F (37.4 ?C) 98.9 ?F (37.2 ?C)  ?TempSrc: Oral Oral Oral Oral  ?SpO2: 97% 98% 98%   ?Weight:      ?Height:      ? ? ?Intake/Output Summary (Last 24 hours) at 04/20/2021 1123 ?Last data filed at 04/20/2021 0600 ?Gross per 24 hour  ?Intake 1203.44 ml  ?Output  800 ml  ?Net 403.44 ml  ? ?Filed Weights  ? 04/19/21 0557  ?Weight: 68.5 kg  ? ?Body mass index is 22.3 kg/m?.  ? ?Physical Examination: ? ?General:  Average built, not in obvious distress, appears chronically ill ?HENT:   No scleral pallor or icterus noted. Oral mucosa is moist.  ?Chest: Diminished breath sounds mostly on the left side.  Left chest wall chest tube in place. ?CVS: S1 &S2 heard. No murmur.  Regular rate and rhythm. ?Abdomen: Soft, nontender, nondistended.  Bowel sounds are heard.   ?Extremities: No cyanosis, clubbing or edema.  Peripheral pulses are palpable. ?Psych: Alert, awake and oriented, normal mood ?CNS:  No cranial nerve deficits.  Power equal in all extremities.   ?Skin: Warm and dry.  Tattoo noted.  Multiple track marks in the skin. ? ? ?Data Reviewed:  ? ?CBC: ?Recent Labs  ?Lab 04/16/21 ?1113 04/17/21 ?0933 04/18/21 ?06/18/21 04/19/21 ?06/19/21 04/20/21 ?0209  ?WBC 21.5* 14.1* 14.8* 15.3* 16.7*  ?NEUTROABS  --   --   --  8.9*  --   ?HGB 11.9* 11.7* 11.3* 11.7* 12.3*  ?HCT 36.1* 35.0* 33.4* 35.3* 37.6*  ?MCV 87.2 85.8 85.2 86.7 87.0  ?PLT 404* 452* 517* 530* 616*  ? ? ?Basic Metabolic Panel: ?Recent Labs  ?Lab 04/16/21 ?1113 04/17/21 ?0933 04/19/21 ?0336 04/20/21 ?0209  ?NA 129* 135 137 133*  ?K 3.7 4.7 3.6 3.6  ?CL 97* 102 104 99  ?CO2 25 25 24 25   ?GLUCOSE 98 103* 65* 113*  ?BUN 9 7 5* 6  ?CREATININE 0.58* 0.64 0.75 0.64  ?CALCIUM 8.5* 8.3* 8.4* 8.2*  ?MG  --   --  2.0 2.1  ?PHOS  --   --   --  4.2  ? ? ?Liver Function Tests: ?Recent Labs  ?Lab 04/16/21 ?1113 04/19/21 ?0336  ?AST 25 13*  ?ALT 27 15  ?ALKPHOS 116 82  ?BILITOT 1.0 <0.1*  ?PROT 7.9 7.2  ?ALBUMIN 2.9* 2.2*  ? ? ? ?Radiology Studies: ?DG Chest 1 View ? ?Result Date: 04/19/2021 ?CLINICAL DATA:  Chest pain today. Chest tube in place. History of pleural effusion. EXAM: CHEST  1 VIEW COMPARISON:  AP chest earlier same day 04/19/2021 FINDINGS: New left lateral lower hemithorax pigtail drainage catheter. Interval decrease in mild-to-moderate  layering left pleural effusion. Cardiac silhouette and mediastinal contours are within normal limits. The right lung is clear. No pneumothorax. Mild multilevel degenerative disc changes of the thoracic spine. Metallic possible jewelry again overlies the left lower hemithorax. IMPRESSION: Left lateral lower hemithorax pigtail drainage catheter with slight decrease in mild-to-moderate left pleural effusion. Electronically Signed   By: 06/19/2021 M.D.   On: 04/19/2021 11:36  ? ?DG Chest Port 1 View ? ?Result Date: 04/20/2021 ?CLINICAL DATA:  Pleural effusion follow-up. EXAM: PORTABLE CHEST 1 VIEW COMPARISON:  Chest x-ray from yesterday. FINDINGS: Left-sided pigtail chest tube  appears slightly retracted compared to yesterday's study. Interval decrease in size of the small loculated left pleural effusion. Improving aeration at the left lung base with residual atelectasis. Right lung is clear. No pneumothorax. The heart size and mediastinal contours are within normal limits. No acute osseous abnormality. Unchanged left nipple ring. IMPRESSION: 1. Slight retraction of the left chest tube with interval decrease in size of the small loculated left pleural effusion. Electronically Signed   By: Obie Dredge M.D.   On: 04/20/2021 08:15  ? ?DG CHEST PORT 1 VIEW ? ?Result Date: 04/19/2021 ?CLINICAL DATA:  Left pleural effusion EXAM: PORTABLE CHEST 1 VIEW COMPARISON:  04/16/2021 FINDINGS: Persistent left pleural effusion with adjacent atelectasis. No pneumothorax. Right lung remains clear. Stable cardiomediastinal contours. IMPRESSION: Similar left pleural effusion and adjacent atelectasis. No pneumothorax. Electronically Signed   By: Guadlupe Spanish M.D.   On: 04/19/2021 08:01   ? ? ? LOS: 2 days  ? ? ?Joycelyn Das, MD ?[facility] ?04/20/2021, 11:23 AM  ?  ?

## 2021-04-21 ENCOUNTER — Inpatient Hospital Stay (HOSPITAL_COMMUNITY): Payer: Self-pay

## 2021-04-21 LAB — COMPREHENSIVE METABOLIC PANEL
ALT: 11 U/L (ref 0–44)
AST: 14 U/L — ABNORMAL LOW (ref 15–41)
Albumin: 1.8 g/dL — ABNORMAL LOW (ref 3.5–5.0)
Alkaline Phosphatase: 57 U/L (ref 38–126)
Anion gap: 8 (ref 5–15)
BUN: 14 mg/dL (ref 6–20)
CO2: 27 mmol/L (ref 22–32)
Calcium: 8 mg/dL — ABNORMAL LOW (ref 8.9–10.3)
Chloride: 102 mmol/L (ref 98–111)
Creatinine, Ser: 0.85 mg/dL (ref 0.61–1.24)
GFR, Estimated: >60 mL/min (ref >60)
Glucose, Bld: 100 mg/dL — ABNORMAL HIGH (ref 70–99)
Potassium: 4 mmol/L (ref 3.5–5.1)
Sodium: 137 mmol/L (ref 135–145)
Total Bilirubin: 0.2 mg/dL — ABNORMAL LOW (ref 0.3–1.2)
Total Protein: 6 g/dL — ABNORMAL LOW (ref 6.5–8.1)

## 2021-04-21 LAB — CULTURE, BLOOD (ROUTINE X 2)
Culture: NO GROWTH
Special Requests: ADEQUATE

## 2021-04-21 LAB — MAGNESIUM: Magnesium: 2.1 mg/dL (ref 1.7–2.4)

## 2021-04-21 LAB — CBC
HCT: 31.8 % — ABNORMAL LOW (ref 39.0–52.0)
Hemoglobin: 10.5 g/dL — ABNORMAL LOW (ref 13.0–17.0)
MCH: 29 pg (ref 26.0–34.0)
MCHC: 33 g/dL (ref 30.0–36.0)
MCV: 87.8 fL (ref 80.0–100.0)
Platelets: 624 10*3/uL — ABNORMAL HIGH (ref 150–400)
RBC: 3.62 MIL/uL — ABNORMAL LOW (ref 4.22–5.81)
RDW: 15.4 % (ref 11.5–15.5)
WBC: 12.4 10*3/uL — ABNORMAL HIGH (ref 4.0–10.5)
nRBC: 0 % (ref 0.0–0.2)

## 2021-04-21 LAB — HCV AB W REFLEX TO QUANT PCR: HCV Ab: REACTIVE — AB

## 2021-04-21 LAB — HCV RT-PCR, QUANT (NON-GRAPH): Hepatitis C Quantitation: NOT DETECTED IU/mL

## 2021-04-21 MED ORDER — STERILE WATER FOR INJECTION IJ SOLN
5.0000 mg | Freq: Once | RESPIRATORY_TRACT | Status: AC
  Administered 2021-04-21: 5 mg via INTRAPLEURAL
  Filled 2021-04-21: qty 5

## 2021-04-21 MED ORDER — SODIUM CHLORIDE (PF) 0.9 % IJ SOLN
10.0000 mg | Freq: Once | INTRAMUSCULAR | Status: AC
  Administered 2021-04-21: 10 mg via INTRAPLEURAL
  Filled 2021-04-21: qty 10

## 2021-04-21 NOTE — Progress Notes (Signed)
? ?  NAME:  Lance Marquez, MRN:  [id number], DOB:  April 10, 1995, LOS: 3 ?ADMISSION DATE:  04/18/2021, CONSULTATION DATE:  04/19/21 ?REFERRING MD:  TRH, CHIEF COMPLAINT:  chest pain  ? ?History of Present Illness:  ?26 year old man with hx of IVDA, recent admit for septic arthritis presenting with pleurisy found to have L loculated effusion, MRSE bacteremia.  Underwent a thoracentesis at [facility] then sent to [facility] for a TCTS consult.  TCTS recommended consulting PCCM so we are now seeing. ? ?Denies any current chest pain or SOB.  Has never had breathing issues before.   ? ?Pertinent  Medical History  ?IVDA ?Asthma ? ?Significant Hospital Events: ?Including procedures, antibiotic start and stop dates in addition to other pertinent events   ?3/6 admit [facility], thora ?3/8 transferred to [facility] ?3/9 TCTS and PCCM consult, pigtail. Dose 1 tpa/dornase ?3/10 second dose of pleural tPA/Dornase given  ?3/11 Will instill third and hopefully last dose of pleural tPA/Dornase  ? ?Interim History / Subjective:  ?Seen lying in bed with complaints of continues chest tube discomfort  ? ?Objective   ?Blood pressure 114/72, pulse 82, temperature 99.3 ?F (37.4 ?C), temperature source Oral, resp. rate 13, height 5\' 9"  (1.753 m), weight 70 kg, SpO2 96 %. ?   ?   ? ?Intake/Output Summary (Last 24 hours) at 04/21/2021 0750 ?Last data filed at 04/20/2021 2216 ?Gross per 24 hour  ?Intake 432.27 ml  ?Output 170 ml  ?Net 262.27 ml  ? ? ?Filed Weights  ? 04/19/21 0557 04/21/21 0410  ?Weight: 68.5 kg 70 kg  ? ? ?Examination: ?General: Thin appearing adult male lying in bed in NAD ?HEENT: ETT, MM pink/moist, PERRL,  ?Neuro: Alert and oriented x3, non-focal  ?CV: s1s2 regular rate and rhythm, no murmur, rubs, or gallops,  ?PULM:  Clear to ascultation bilaterally, no increased work of breathing, no added breath sounds  ?GI: soft, bowel sounds active in all 4 quadrants, non-tender, non-distended, tolerating oral diet  ?Extremities: warm/dry, no edema  ?Skin: no  rashes or lesions  ? ?Resolved Hospital Problem list   ?N/a ? ?Assessment & Plan:  ? ?Left sided loculated effusion  ?- probably empyema ?Hx IVDA  ?Hx septic arthritis  ?-chest tube placed 3/9 ?-1st dose tpa/dornase 3/9, 2nd dose 3/10, 3rd dose 3/11 ?P: ?Continue Linezolid and Unasyn per ID  ?Remains on Toradol for pain control  ?Will repeat 3rd dose of tPA this am ?Repeat CXR in am  ?Mobilize as able  ? ?Polysubstance abuse ?-39yr hx IV heroin and meth use ?-feels suboxone has been very helpful inpt and is motivated to figure out how to stop using. Has been using less fq since leaving prison compared to before prison ?-lives with dad who is a major support ?P: ?Continue Suboxone  ?Consult to [facility] for resources upon discharge  ?Continue schedule Trazodone for sleep  ? ?Remainder of care per primary ? ? ?Best Practice (right click and "Reselect all SmartList Selections" daily)  ?Per primary ? ?Signature:  ?Nishika Parkhurst D. Harris, NP-C ?[facility] ?Personal contact information can be found on Amion  ?04/21/2021, 7:58 AM ? ? ? ? ?

## 2021-04-21 NOTE — Progress Notes (Signed)
[facility]   Reason for visit: Follow up on pneumonia with loculated empyema  Interval History: remains afebrile; no new complaints.  Day 6 total antibiotics   Physical Exam: Constitutional:  Vitals:   04/21/21 0841 04/21/21 1244  BP: 110/61 119/72  Pulse: 97 90  Resp: 18 16  Temp: 98.2 F (36.8 C) 98 F (36.7 C)  SpO2:     patient appears in NAD Respiratory: Normal respiratory effort; CTA B Cardiovascular: RRR   Review of Systems: Constitutional: negative for fevers and chills Gastrointestinal: negative for nausea and diarrhea  Lab Results  Component Value Date   WBC 16.7 (H) 04/20/2021   HGB 12.3 (L) 04/20/2021   HCT 37.6 (L) 04/20/2021   MCV 87.0 04/20/2021   PLT 616 (H) 04/20/2021    Lab Results  Component Value Date   CREATININE 0.64 04/20/2021   BUN 6 04/20/2021   NA 133 (L) 04/20/2021   K 3.6 04/20/2021   CL 99 04/20/2021   CO2 25 04/20/2021    Lab Results  Component Value Date   ALT 15 04/19/2021   AST 13 (L) 04/19/2021   ALKPHOS 82 04/19/2021     Microbiology: Recent Results (from the past 240 hour(s))  Blood culture (routine x 2)     Status: Abnormal   Collection Time: 04/16/21  1:35 PM   Specimen: BLOOD  Result Value Ref Range Status   Specimen Description   Final    BLOOD BLOOD LEFT FOREARM Performed at [facility], [address] [place], [place] [address]    Special Requests   Final    BOTTLES DRAWN AEROBIC AND ANAEROBIC Blood Culture adequate volume Performed at [facility], [address] [place], [place] [address]    Culture  Setup Time   Final    GRAM POSITIVE COCCI ANAEROBIC BOTTLE ONLY CRITICAL RESULT CALLED TO, READ BACK BY AND VERIFIED WITH: DEVAN MITCHELL 04/17/21 1442 MW GRAM STAIN REVIEWED-AGREE WITH RESULT    Culture (A)  Final    STAPHYLOCOCCUS EPIDERMIDIS THE SIGNIFICANCE OF ISOLATING THIS ORGANISM FROM A SINGLE SET OF BLOOD CULTURES WHEN MULTIPLE SETS ARE DRAWN  IS UNCERTAIN. PLEASE NOTIFY THE MICROBIOLOGY DEPARTMENT WITHIN ONE WEEK IF SPECIATION AND SENSITIVITIES ARE REQUIRED. Performed at [facility], [address] [place], [place] [address]    Report Status 04/19/2021 FINAL  Final  Blood Culture ID Panel (Reflexed)     Status: Abnormal   Collection Time: 04/16/21  1:35 PM  Result Value Ref Range Status   Enterococcus faecalis NOT DETECTED NOT DETECTED Final   Enterococcus Faecium NOT DETECTED NOT DETECTED Final   Listeria monocytogenes NOT DETECTED NOT DETECTED Final   Staphylococcus species DETECTED (A) NOT DETECTED Final    Comment: CRITICAL RESULT CALLED TO, READ BACK BY AND VERIFIED WITH: DEVAN MITCHELL 04/17/21 1143 MW    Staphylococcus aureus (BCID) NOT DETECTED NOT DETECTED Final   Staphylococcus epidermidis DETECTED (A) NOT DETECTED Final    Comment: Methicillin (oxacillin) resistant coagulase negative staphylococcus. Possible blood culture contaminant (unless isolated from more than one blood culture draw or clinical case suggests pathogenicity). No antibiotic treatment is indicated for blood  culture contaminants. CRITICAL RESULT CALLED TO, READ BACK BY AND VERIFIED WITH: [facility] MITCHELL 04/17/21 1143 MW    Staphylococcus lugdunensis NOT DETECTED NOT DETECTED Final   Streptococcus species NOT DETECTED NOT DETECTED Final   Streptococcus agalactiae NOT DETECTED NOT DETECTED Final   Streptococcus pneumoniae NOT DETECTED NOT  DETECTED Final   Streptococcus pyogenes NOT DETECTED NOT DETECTED Final   A.calcoaceticus-baumannii NOT DETECTED NOT DETECTED Final   Bacteroides fragilis NOT DETECTED NOT DETECTED Final   Enterobacterales NOT DETECTED NOT DETECTED Final   Enterobacter cloacae complex NOT DETECTED NOT DETECTED Final   Escherichia coli NOT DETECTED NOT DETECTED Final   Klebsiella aerogenes NOT DETECTED NOT DETECTED Final   Klebsiella oxytoca NOT DETECTED NOT DETECTED Final   Klebsiella pneumoniae NOT DETECTED NOT DETECTED  Final   Proteus species NOT DETECTED NOT DETECTED Final   Salmonella species NOT DETECTED NOT DETECTED Final   Serratia marcescens NOT DETECTED NOT DETECTED Final   Haemophilus influenzae NOT DETECTED NOT DETECTED Final   Neisseria meningitidis NOT DETECTED NOT DETECTED Final   Pseudomonas aeruginosa NOT DETECTED NOT DETECTED Final   Stenotrophomonas maltophilia NOT DETECTED NOT DETECTED Final   Candida albicans NOT DETECTED NOT DETECTED Final   Candida auris NOT DETECTED NOT DETECTED Final   Candida glabrata NOT DETECTED NOT DETECTED Final   Candida krusei NOT DETECTED NOT DETECTED Final   Candida parapsilosis NOT DETECTED NOT DETECTED Final   Candida tropicalis NOT DETECTED NOT DETECTED Final   Cryptococcus neoformans/gattii NOT DETECTED NOT DETECTED Final   Methicillin resistance mecA/C DETECTED (A) NOT DETECTED Final    Comment: CRITICAL RESULT CALLED TO, READ BACK BY AND VERIFIED WITH: Drusilla KannerDEVAN MITCHELL 04/17/21 1143 MW Performed at [facility], [address] [place], [place] [address]   Blood culture (routine x 2)     Status: None   Collection Time: 04/16/21  1:42 PM   Specimen: BLOOD  Result Value Ref Range Status   Specimen Description BLOOD BLOOD RIGHT HAND  Final   Special Requests   Final    BOTTLES DRAWN AEROBIC AND ANAEROBIC Blood Culture adequate volume   Culture   Final    NO GROWTH 5 DAYS Performed at [facility], [address] [place], [place] [address]    Report Status 04/21/2021 FINAL  Final  Resp Panel by RT-PCR (Flu A&B, Covid) Nasopharyngeal Swab     Status: None   Collection Time: 04/16/21  2:05 PM   Specimen: Nasopharyngeal Swab; Nasopharyngeal(NP) swabs in vial transport medium  Result Value Ref Range Status   SARS Coronavirus 2 by RT PCR NEGATIVE NEGATIVE Final    Comment: (NOTE) SARS-CoV-2 target nucleic acids are NOT DETECTED.  The SARS-CoV-2 RNA is generally detectable in upper respiratory specimens during the acute phase of  infection. The lowest concentration of SARS-CoV-2 viral copies this assay can detect is 138 copies/mL. A negative result does not preclude SARS-Cov-2 infection and should not be used as the sole basis for treatment or other patient management decisions. A negative result may occur with  improper specimen collection/handling, submission of specimen other than nasopharyngeal swab, presence of viral mutation(s) within the areas targeted by this assay, and inadequate number of viral copies(<138 copies/mL). A negative result must be combined with clinical observations, patient history, and epidemiological information. The expected result is Negative.  Fact Sheet for Patients:  BloggerCourse.comhttps://www.fda.gov/media/152166/download  Fact Sheet for Healthcare Providers:  SeriousBroker.ithttps://www.fda.gov/media/152162/download  This test is no t yet approved or cleared by the [place] FDA and  has been authorized for detection and/or diagnosis of SARS-CoV-2 by FDA under an Emergency Use Authorization (EUA). This EUA will remain  in effect (meaning this test can be used) for the duration of the COVID-19 declaration under Section 564(b)(1) of the Act, 21 U.S.C.section 360bbb-3(b)(1), unless the authorization is  terminated  or revoked sooner.       Influenza A by PCR NEGATIVE NEGATIVE Final   Influenza B by PCR NEGATIVE NEGATIVE Final    Comment: (NOTE) The Xpert Xpress SARS-CoV-2/FLU/RSV plus assay is intended as an aid in the diagnosis of influenza from Nasopharyngeal swab specimens and should not be used as a sole basis for treatment. Nasal washings and aspirates are unacceptable for Xpert Xpress SARS-CoV-2/FLU/RSV testing.  Fact Sheet for Patients: BloggerCourse.com  Fact Sheet for Healthcare Providers: SeriousBroker.it  This test is not yet approved or cleared by the [place] FDA and has been authorized for detection and/or diagnosis of SARS-CoV-2  by FDA under an Emergency Use Authorization (EUA). This EUA will remain in effect (meaning this test can be used) for the duration of the COVID-19 declaration under Section 564(b)(1) of the Act, 21 U.S.C. section 360bbb-3(b)(1), unless the authorization is terminated or revoked.  Performed at [facility], [address] [place], [place] [address]   Body fluid culture w Gram Stain     Status: None   Collection Time: 04/16/21  3:55 PM   Specimen: PATH Cytology Pleural fluid  Result Value Ref Range Status   Specimen Description   Final    PLEURAL Performed at [facility], [address] [place], [place] [address]    Special Requests   Final    NONE Performed at [facility], [address] [place], [place] [address]    Gram Stain RARE WBC SEEN NO ORGANISMS SEEN   Final   Culture   Final    NO GROWTH Performed at [facility], [address], [place], [place] [address]    Report Status 04/20/2021 FINAL  Final  MT-RIF NAA W Cult, Non-Sputum     Status: None (Preliminary result)   Collection Time: 04/16/21  3:55 PM   Specimen: PATH Cytology Pleural fluid  Result Value Ref Range Status   Source PLEURAL  Final    Comment: Performed at [facility], [address] [place], [place] [address]   Specimen Source Pleural fluid  Final   M tuberculosis complex Comment NOT DETECTED Final    Comment: (NOTE) Mycobacterium tuberculosis complex (MTBC) NOT detected. This test was developed and its performance characteristics determined by [facility]. It has not been cleared or approved by the Food and Drug Administration.    Rifampin Comment Susceptible Final    Comment: (NOTE) Because Mycobacterium tuberculosis complex (MTBC) was not detected, no rifampin determination is possible. This test was developed and its performance characteristics determined by [facility]. It has not been cleared or approved by the Food and Drug Administration.     AFB Specimen Processing Concentration  Final    Comment: (NOTE) Performed At: [facility] [address] [place], [place] [id number] Jolene Schimke MD WN:[id number]    Acid Fast Culture PENDING  Incomplete  MRSA Next Gen by PCR, Nasal     Status: Abnormal   Collection Time: 04/17/21  9:43 AM   Specimen: Nasal Mucosa; Nasal Swab  Result Value Ref Range Status   MRSA by PCR Next Gen DETECTED (A) NOT DETECTED Final    Comment: RESULT CALLED TO, READ BACK BY AND VERIFIED WITH: Sung Amabile, RN 04/17/21 1102 MW (NOTE) The GeneXpert MRSA Assay (FDA approved for NASAL specimens only), is one component of a comprehensive MRSA colonization surveillance program. It is not intended to diagnose MRSA infection nor to guide or monitor treatment for MRSA infections. Test performance  is not FDA approved in patients less than 100 years old. Performed at [facility], [address] [place], [place] [address]   Acid Fast Smear (AFB)     Status: None   Collection Time: 04/19/21 11:00 AM   Specimen: PATH Cytology Pleural fluid  Result Value Ref Range Status   AFB Specimen Processing Concentration  Final   Acid Fast Smear Negative  Final    Comment: (NOTE) Performed At: [facility] [address] [place], [place] [id number] Jolene Schimke MD XB:[id number]    Source (AFB) PLEURAL  Final    Comment: FLUID Performed at [facility], [address], [place], [place] [address]   Aerobic/Anaerobic Culture w Gram Stain (surgical/deep wound)     Status: None (Preliminary result)   Collection Time: 04/19/21 11:00 AM   Specimen: Pleura  Result Value Ref Range Status   Specimen Description PLEURAL FLUID  Final   Special Requests Normal  Final   Gram Stain   Final    WBC PRESENT, PREDOMINANTLY PMN NO ORGANISMS SEEN CYTOSPIN SMEAR    Culture   Final    NO GROWTH < 24 HOURS Performed at [facility], [address], [place], [place] [address]    Report Status  PENDING  Incomplete    Impression/Plan:  1. Complicated pneumonia with loculated empyema - chest tube placed and getting fibrinolytics by CCM.  Culture from the pleural fluid with no growth to date.  AFB smear negative, previous NAA negative for tuberculosis.  Unclear organism and on broad coverage with linezolid and ampicillin/sulbactam.   Will continue with antibiotics including ampicillin/sulbactam.  Will change linezolid to doxycycline.    2.  Infection prevention - on airborne isolation due to concern for tuberculosis as cause of his empyema.  His AFB smear is negative in the pleural fluid and previous NAA negative for MTB so no further concerns for Tb now so no further indication for any further testing.  With no signs of Tb in his pleural fluid, I have no concern for pulmonary Tb so there is no further indication for continued airborne precautions and I will d/c this.     3.  Substance abuse - on suboxone and no signs of withdrawal.  Continue with current care.

## 2021-04-21 NOTE — Plan of Care (Signed)

## 2021-04-21 NOTE — Assessment & Plan Note (Addendum)
Has been removed 04/23/2021.  We will follow-up in the pulmonary clinic after discharge.

## 2021-04-21 NOTE — Progress Notes (Signed)
Chest Tube stop cock opened s/p 1 hr medication dwell time. Tolerated well. No c/o expressed at this time.  ?

## 2021-04-21 NOTE — Progress Notes (Signed)
?PROGRESS NOTE ? ? ? ?Lance Marquez  [id number] DOB: 05-Aug-1995 DOA: 04/18/2021 ?PCP: Pcp, No  ? ? ?Brief Narrative:  ?Lance Marquez is a 26 y.o. male with past medical history of septic arthritis, IV drug abuse presented to hospital with left lateral chest wall pain for 3 days which was severe in intensity with some fever chills and productive yellowish sputum.  In the ED, patient had a chest x-ray which showed large left pleural effusion with adjacent consolidation.  CT surgery was consulted and subsequently pulmonary was consulted.  Patient underwent chest tube placement by pulmonary.  Pulmonary managing chest tube placement.  Patient has undergone instillation of fibrinolytics x2. infectious disease was also consulted for antibiotic management.  Patient was continued on Suboxone protocol.  Patient is currently on isolation protocol for concerns of TB. Pleural fluid culture negative so far.     ? ?  ?Assessment and Plan: ?* Pleural effusion on left ?Complicated pneumonia with loculated empyema.   PCCM and infectious disease on board.  Currently on Unasyn and Zyvox.  Status post chest tube placement with instillation of fibrinolytics x2, second instillation today.  Continue plan as per ID and pulmonary.  Negative in less than 24 hours.  AFB smear negative. ? ?Hyponatremia ?Mild.  Sodium at 133.  We will continue to monitor. ? ?Protein-calorie malnutrition, severe ?Present on admission.  Dietitian consult appreciated, on Ensure..  Encourage oral nutrition. ? ?Chest tube in place ?Chest tube management as per pulmonary. ? ?Opioid withdrawal ([place]) ?IV drug abuse and dependence.  History of IV heroin usage.   on Suboxone protocol.  No active withdrawal symptoms at this time.  Toradol for pain relief at this time. ? ? ? ? DVT prophylaxis: SCDs Start: 04/18/21 1929 ? ? ?Code Status:   ?  Code Status: Full Code ? ?Disposition: Home ? ?Status is: Inpatient ? ?Remains inpatient appropriate because:  Complicated pleural effusion, status post chest tube placement, IV antibiotic, administration of fibrinolytics ? ? Family Communication:  ?Spoke with the patient at bedside ? ?Consultants:  ?Infectious disease ?Pulmonary ? ?Procedures:  ?Chest tube  insertion with imaging guidance on 04/19/2021.   ?Fibrinolysis of complicated pleural effusion on 04/19/2021 and on 3/11 ? ?Antimicrobials:  ?Unasyn and linezolid ? ?Anti-infectives (From admission, onward)  ? ? Start     Dose/Rate Route Frequency Ordered Stop  ? 04/19/21 1030  Ampicillin-Sulbactam (UNASYN) 3 g in sodium chloride 0.9 % 100 mL IVPB       ? 3 g ?200 mL/hr over 30 Minutes Intravenous Every 6 hours 04/19/21 0935    ? 04/18/21 2200  linezolid (ZYVOX) IVPB 600 mg       ?Note to Pharmacy: (has been on Zyvox per ID recs during hospitalization at [facility] leading up to today's transfer to [facility]; documented allergy to Vanc)  ? 600 mg ?300 mL/hr over 60 Minutes Intravenous Every 12 hours 04/18/21 1959    ? 04/18/21 2200  piperacillin-tazobactam (ZOSYN) IVPB 3.375 g  Status:  Discontinued       ? 3.375 g ?12.5 mL/hr over 240 Minutes Intravenous Every 8 hours 04/18/21 2035 04/19/21 0935  ? ?  ? ?Subjective: ? ?Today, patient was seen and examined at bedside.  Denies overt pain, nausea, vomiting, fever chills or shortness of breath.  Lying flat in bed.  Was able to sleep better.   ? ?Objective: ?Vitals:  ? 04/21/21 0028 04/21/21 0410 04/21/21 [id number] 04/21/21 0841  ?BP: (!) 99/55 114/72  110/61  ?Pulse:  85 82  97  ?Resp: 17 13 13 18   ?Temp: 97.9 ?F (36.6 ?C) 99.3 ?F (37.4 ?C)  98.2 ?F (36.8 ?C)  ?TempSrc: Oral Oral  Oral  ?SpO2: 97% 96%    ?Weight:  70 kg    ?Height:      ? ? ?Intake/Output Summary (Last 24 hours) at 04/21/2021 1110 ?Last data filed at 04/20/2021 2216 ?Gross per 24 hour  ?Intake 432.27 ml  ?Output 170 ml  ?Net 262.27 ml  ? ?Filed Weights  ? 04/19/21 0557 04/21/21 0410  ?Weight: 68.5 kg 70 kg  ? ?Body mass index is 22.79 kg/m?.  ? ?Physical Examination: ? ?General:   Average built, not in obvious distress appears chronically ill, ?HENT:   No scleral pallor or icterus noted. Oral mucosa is moist.  ?Chest: Diminished breath sounds bilaterally.  Left chest wall-chest tube in place  ?CVS: S1 &S2 heard. No murmur.  Regular rate and rhythm. ?Abdomen: Soft, nontender, nondistended.  Bowel sounds are heard.   ?Extremities: No cyanosis, clubbing or edema.  Peripheral pulses are palpable. ?Psych: Alert, awake and oriented, normal mood ?CNS:  No cranial nerve deficits.  Power equal in all extremities.   ?Skin: Warm and dry.  Injection marks in the skin.  Tattoos noted. ? ? ?Data Reviewed:  ? ?CBC: ?Recent Labs  ?Lab 04/16/21 ?1113 04/17/21 ?0933 04/18/21 ?[id number] 04/19/21 ?[id number] 04/20/21 ?0209  ?WBC 21.5* 14.1* 14.8* 15.3* 16.7*  ?NEUTROABS  --   --   --  8.9*  --   ?HGB 11.9* 11.7* 11.3* 11.7* 12.3*  ?HCT 36.1* 35.0* 33.4* 35.3* 37.6*  ?MCV 87.2 85.8 85.2 86.7 87.0  ?PLT 404* 452* 517* 530* 616*  ? ? ?Basic Metabolic Panel: ?Recent Labs  ?Lab 04/16/21 ?1113 04/17/21 ?0933 04/19/21 ?0336 04/20/21 ?0209  ?NA 129* 135 137 133*  ?K 3.7 4.7 3.6 3.6  ?CL 97* 102 104 99  ?CO2 25 25 24 25   ?GLUCOSE 98 103* 65* 113*  ?BUN 9 7 5* 6  ?CREATININE 0.58* 0.64 0.75 0.64  ?CALCIUM 8.5* 8.3* 8.4* 8.2*  ?MG  --   --  2.0 2.1  ?PHOS  --   --   --  4.2  ? ? ?Liver Function Tests: ?Recent Labs  ?Lab 04/16/21 ?1113 04/19/21 ?0336  ?AST 25 13*  ?ALT 27 15  ?ALKPHOS 116 82  ?BILITOT 1.0 <0.1*  ?PROT 7.9 7.2  ?ALBUMIN 2.9* 2.2*  ? ? ? ?Radiology Studies: ?DG Chest 1 View ? ?Result Date: 04/19/2021 ?CLINICAL DATA:  Chest pain today. Chest tube in place. History of pleural effusion. EXAM: CHEST  1 VIEW COMPARISON:  AP chest earlier same day 04/19/2021 FINDINGS: New left lateral lower hemithorax pigtail drainage catheter. Interval decrease in mild-to-moderate layering left pleural effusion. Cardiac silhouette and mediastinal contours are within normal limits. The right lung is clear. No pneumothorax. Mild multilevel  degenerative disc changes of the thoracic spine. Metallic possible jewelry again overlies the left lower hemithorax. IMPRESSION: Left lateral lower hemithorax pigtail drainage catheter with slight decrease in mild-to-moderate left pleural effusion. Electronically Signed   By: Yvonne Kendall M.D.   On: 04/19/2021 11:36  ? ?DG Chest Port 1 View ? ?Result Date: 04/20/2021 ?CLINICAL DATA:  Pleural effusion follow-up. EXAM: PORTABLE CHEST 1 VIEW COMPARISON:  Chest x-ray from yesterday. FINDINGS: Left-sided pigtail chest tube appears slightly retracted compared to yesterday's study. Interval decrease in size of the small loculated left pleural effusion. Improving aeration at the left lung base with residual atelectasis. Right  lung is clear. No pneumothorax. The heart size and mediastinal contours are within normal limits. No acute osseous abnormality. Unchanged left nipple ring. IMPRESSION: 1. Slight retraction of the left chest tube with interval decrease in size of the small loculated left pleural effusion. Electronically Signed   By: Titus Dubin M.D.   On: 04/20/2021 08:15   ? ? ? LOS: 3 days  ? ? ?Flora Lipps, MD ?[facility] ?04/21/2021, 11:10 AM  ?  ?

## 2021-04-21 NOTE — Procedures (Signed)
Pleural Fibrinolytic Administration Procedure Note ? ?Lance Marquez  ?[id number]  ?August 08, 1995 ? ?Date:04/21/21  ?Time:9:50 AM  ? ?Provider Performing:Shonte Soderlund D. Harris  ? ?Procedure: Pleural Fibrinolysis Subsequent day ([address]678) ? ?Indication(s) ?Fibrinolysis of complicated pleural effusion ? ?Consent ?Risks of the procedure as well as the alternatives and risks of each were explained to the patient and/or caregiver.  Consent for the procedure was obtained. ? ? ?Anesthesia ?None ? ? ?Time Out ?Verified patient identification, verified procedure, site/side was marked, verified correct patient position, special equipment/implants available, medications/allergies/relevant history reviewed, required imaging and test results available. ? ? ?Sterile Technique ?Hand hygiene, gloves ? ? ?Procedure Description ?Existing pleural catheter was cleaned and accessed in sterile manner.  10mg  of tPA in 30cc of saline and 5mg  of dornase in 30cc of sterile water were injected into pleural space using existing pleural catheter.  Catheter will be clamped for 1 hour and then placed back to suction. ? ? ?Complications/Tolerance ?None; patient tolerated the procedure well. ? ?EBL ?None ? ? ?Specimen(s) ?None ? [name], NP-C ?[facility] ?Personal contact information can be found on Amion  ?04/21/2021, 9:50 AM ? ? ?

## 2021-04-21 NOTE — Assessment & Plan Note (Addendum)
Present on admission.  Seen by dietitian, received Ensure during hospitalization.  Encouraged oral nutrition on discharge.

## 2021-04-22 ENCOUNTER — Inpatient Hospital Stay (HOSPITAL_COMMUNITY): Payer: Self-pay

## 2021-04-22 MED ORDER — POLYETHYLENE GLYCOL 3350 17 G PO PACK: 17.0000 g | PACK | Freq: Every day | ORAL | Status: DC | PRN

## 2021-04-22 MED ORDER — DOCUSATE SODIUM 100 MG PO CAPS
100.0000 mg | ORAL_CAPSULE | Freq: Two times a day (BID) | ORAL | Status: DC
  Administered 2021-04-23: 100 mg via ORAL
  Filled 2021-04-22 (×4): qty 1

## 2021-04-22 NOTE — Progress Notes (Signed)
PROGRESS NOTE    Lance Marquez  BZJ:[id number] DOB: 26-Aug-1995 DOA: 04/18/2021 PCP: Oneita Hurt, No    Brief Narrative:  Lance Marquez is a 26 y.o. male with past medical history of septic arthritis, IV drug abuse presented to hospital with left lateral chest wall pain for 3 days which was severe in intensity with some fever chills and productive yellowish sputum.  In the ED, patient had a chest x-ray which showed large left pleural effusion with adjacent consolidation.  CT surgery was consulted and subsequently pulmonary was consulted.  Patient underwent chest tube placement by pulmonary.  Pulmonary managing chest tube placement.  Patient has undergone instillation of fibrinolytics x2. Infectious disease was also consulted for antibiotic management.  Patient was continued on Suboxone protocol.  Pleural fluid also negative so far, smear negative for AFB so far.     Assessment and Plan: * Pleural effusion on left Complicated pneumonia with loculated empyema.   PCCM and infectious disease on board.  Currently on Unasyn and Zyvox.  Status post chest tube placement with instillation of fibrinolytics x3.  Continue plan as per ID and pulmonary.  Pleural fluid culture with rare Staph aureus.  Susceptibility to follow. AFB smear negative.  PCCM planning for CT chest might need CT surgery consultation if not improved.  Hyponatremia Improved, sodium at 137.   Protein-calorie malnutrition, severe Present on admission.  Seen by dietitian, continue Ensure..  Encourage oral nutrition.  Chest tube in place Chest tube management as per pulmonary.  Opioid withdrawal (HCC) IV drug abuse and dependence.  History of IV heroin usage.   on Suboxone protocol.  No active withdrawal symptoms at this time.  Continue Toradol for pain relief at this time.     DVT prophylaxis: SCDs Start: 04/18/21 1929   Code Status:     Code Status: Full Code  Disposition: Home  Status is: Inpatient  Remains  inpatient appropriate because: Complicated pleural effusion, status post chest tube placement, IV antibiotic, administration of fibrinolytics   Family Communication:  Spoke with the patient at bedside  Consultants:  Infectious disease Pulmonary  Procedures:  Chest tube  insertion with imaging guidance on 04/19/2021.   Fibrinolysis of complicated pleural effusion on 04/19/2021 and on 3/11  Antimicrobials:  Unasyn and linezolid  Anti-infectives (From admission, onward)    Start     Dose/Rate Route Frequency Ordered Stop   04/19/21 1030  Ampicillin-Sulbactam (UNASYN) 3 g in sodium chloride 0.9 % 100 mL IVPB        3 g 200 mL/hr over 30 Minutes Intravenous Every 6 hours 04/19/21 0935     04/18/21 2200  linezolid (ZYVOX) IVPB 600 mg       Note to Pharmacy: (has been on Zyvox per ID recs during hospitalization at [facility] leading up to today's transfer to [facility]; documented allergy to Vanc)   600 mg 300 mL/hr over 60 Minutes Intravenous Every 12 hours 04/18/21 1959     04/18/21 2200  piperacillin-tazobactam (ZOSYN) IVPB 3.375 g  Status:  Discontinued        3.375 g 12.5 mL/hr over 240 Minutes Intravenous Every 8 hours 04/18/21 2035 04/19/21 0935      Subjective:  Today, patient was seen and examined at bedside.  Patient denies overt pain, nausea, vomiting, fever or chills.  Lying comfortably in bed.   Objective: Vitals:   04/21/21 2000 04/22/21 0003 04/22/21 0530 04/22/21 0739  BP: 122/74 (!) 105/59 112/66   Pulse: 88 75 81   Resp:  15 14 15 13   Temp: 98 F (36.7 C) 98 F (36.7 C) 99.2 F (37.3 C)   TempSrc: Axillary Axillary Oral   SpO2:  96% 97%   Weight:   69.5 kg   Height:        Intake/Output Summary (Last 24 hours) at 04/22/2021 1140 Last data filed at 04/22/2021 0524 Gross per 24 hour  Intake 1612.94 ml  Output 1490 ml  Net 122.94 ml   Filed Weights   04/19/21 0557 04/21/21 0410 04/22/21 0530  Weight: 68.5 kg 70 kg 69.5 kg   Body mass index is 22.64 kg/m.    Physical Examination:  General:  Average built, not in obvious distress, chronically ill, HENT:   No scleral pallor or icterus noted. Oral mucosa is moist.  Chest: Left-sided chest tube in place.  Decreased breath sounds bilaterally.  CVS: S1 &S2 heard. No murmur.  Regular rate and rhythm. Abdomen: Soft, nontender, nondistended.  Bowel sounds are heard.   Extremities: No cyanosis, clubbing or edema.  Peripheral pulses are palpable. Psych: Alert, awake and oriented, normal mood CNS:  No cranial nerve deficits.  Power equal in all extremities.   Skin: Warm and dry.  Injection marks and tattoos    Data Reviewed:   CBC: Recent Labs  Lab 04/17/21 0933 04/18/21 0943 04/19/21 0336 04/20/21 0209 04/21/21 1630  WBC 14.1* 14.8* 15.3* 16.7* 12.4*  NEUTROABS  --   --  8.9*  --   --   HGB 11.7* 11.3* 11.7* 12.3* 10.5*  HCT 35.0* 33.4* 35.3* 37.6* 31.8*  MCV 85.8 85.2 86.7 87.0 87.8  PLT 452* 517* 530* 616* 624*    Basic Metabolic Panel: Recent Labs  Lab 04/16/21 1113 04/17/21 0933 04/19/21 0336 04/20/21 0209 04/21/21 1630  NA 129* 135 137 133* 137  K 3.7 4.7 3.6 3.6 4.0  CL 97* 102 104 99 102  CO2 25 25 24 25 27   GLUCOSE 98 103* 65* 113* 100*  BUN 9 7 5* 6 14  CREATININE 0.58* 0.64 0.75 0.64 0.85  CALCIUM 8.5* 8.3* 8.4* 8.2* 8.0*  MG  --   --  2.0 2.1 2.1  PHOS  --   --   --  4.2  --     Liver Function Tests: Recent Labs  Lab 04/16/21 1113 04/19/21 0336 04/21/21 1630  AST 25 13* 14*  ALT 27 15 11   ALKPHOS 116 82 57  BILITOT 1.0 <0.1* 0.2*  PROT 7.9 7.2 6.0*  ALBUMIN 2.9* 2.2* 1.8*     Radiology Studies: CT CHEST WO CONTRAST  Result Date: 04/22/2021 CLINICAL DATA:  Empyema EXAM: CT CHEST WITHOUT CONTRAST TECHNIQUE: Multidetector CT imaging of the chest was performed following the standard protocol without IV contrast. RADIATION DOSE REDUCTION: This exam was performed according to the departmental dose-optimization program which includes automated exposure  control, adjustment of the mA and/or kV according to patient size and/or use of iterative reconstruction technique. COMPARISON:  Chest radiograph 04/21/2021 and CT chest 04/16/2021 FINDINGS: Cardiovascular: Unremarkable Mediastinum/Nodes: Scattered mediastinal lymph nodes including a 1.0 cm pretracheal lymph node (formerly 1.1 cm). These are most likely reactive. Lungs/Pleura: Left pleural drainage catheter in place. Compared to 04/16/2021 there is substantial reduction in size of the left pleural fluid collection, although there remains a small pleural effusion along with scattered pleural gas on the left Ed there is atelectasis in the left lower lobe posteriorly, disproportionate to the size of the pleural effusion. Mild dependent atelectasis in the right lower lobe. Upper  Abdomen: Unremarkable Musculoskeletal: Mild dextroconvex thoracic scoliosis. IMPRESSION: 1. Small residual left pleural effusion and scattered loculations of left pleural gas. Pigtail pleural drainage catheter remains in place. 2. Mild atelectasis in the left lower lobe dependently. There is a small amount of dependent right lower lobe atelectasis. 3. Borderline enlarged reactive lymph nodes in the mediastinum. 4. Mild dextroconvex thoracic scoliosis. Electronically Signed   By: Gaylyn RongWalter  Liebkemann M.D.   On: 04/22/2021 09:23   DG CHEST PORT 1 VIEW  Result Date: 04/21/2021 CLINICAL DATA:  Follow-up left pleural effusion.  Chest tube. EXAM: PORTABLE CHEST 1 VIEW COMPARISON:  04/20/2021 FINDINGS: Left pleural pigtail catheter is unchanged in position. Mild left lateral pleural thickening or fluid is stable. Small pneumothorax component is seen medially along the upper left heart border. Decreased atelectasis or infiltrate seen in the left lung base. Right lung remains clear. Heart size is normal. IMPRESSION: Decreased left basilar atelectasis versus infiltrate. Left pleural catheter remains in place. Stable mild left lateral pleural thickening  or fluid. Small left pneumothorax is now seen medially, along the upper left heart border. Electronically Signed   By: Danae OrleansJohn A Stahl M.D.   On: 04/21/2021 11:52      LOS: 4 days    Joycelyn DasLaxman Chayah Mckee, MD [facility] 04/22/2021, 11:40 AM

## 2021-04-22 NOTE — Progress Notes (Cosign Needed)
? ?  NAME:  Lance Marquez, MRN:  [id number], DOB:  10-21-1995, LOS: 4 ?ADMISSION DATE:  04/18/2021, CONSULTATION DATE:  04/19/21 ?REFERRING MD:  TRH, CHIEF COMPLAINT:  chest pain  ? ?History of Present Illness:  ?26 year old man with hx of IVDA, recent admit for septic arthritis presenting with pleurisy found to have L loculated effusion, MRSE bacteremia.  Underwent a thoracentesis at [facility] then sent to [facility] for a TCTS consult.  TCTS recommended consulting PCCM so we are now seeing. ? ?Denies any current chest pain or SOB.  Has never had breathing issues before.   ? ?Pertinent  Medical History  ?IVDA ?Asthma ? ?Significant Hospital Events: ?Including procedures, antibiotic start and stop dates in addition to other pertinent events   ?3/6 admit [facility], thora ?3/8 transferred to [facility] ?3/9 TCTS and PCCM consult, pigtail. Dose 1 tpa/dornase ?3/10 second dose of pleural tPA/Dornase given  ?3/11 Will instill third and hopefully last dose of pleural tPA/Dornase  ? ?Interim History / Subjective:  ?Got 3rd dose of tpa/dornase yesterday  ? ?Objective   ?Blood pressure 112/66, pulse 81, temperature 99.2 ?F (37.3 ?C), temperature source Oral, resp. rate 15, height 5\' 9"  (1.753 m), weight 69.5 kg, SpO2 97 %. ?   ?   ? ?Intake/Output Summary (Last 24 hours) at 04/22/2021 06/22/2021 ?Last data filed at 04/22/2021 0524 ?Gross per 24 hour  ?Intake 1732.94 ml  ?Output 1490 ml  ?Net 242.94 ml  ? ?Filed Weights  ? 04/19/21 0557 04/21/21 0410 04/22/21 0530  ?Weight: 68.5 kg 70 kg 69.5 kg  ? ? ?Examination: ?General: thin young adult M NAD  ?HEENT: NCAT pink mm  ?Neuro: AAOx4 no focal def  ?CV: rr s1s2 ?PULM:  CTAb chest tube with serosang outpt  ?GI: soft thin ndnt  ?Extremities: no acute deformity  ?Skin: scattered healed tattoos, scattered track marks  ? ?Resolved Hospital Problem list   ?N/a ? ?Assessment & Plan:  ? ?Left sided loculated pleural effusion (likely empyema-- rare staph aureus on cx)  ?Small Left sided ptx -- trapped lung  ?Hx  IVDU ?Hx septic arthritis  ?TB ruled out  ?-chest tube placed 3/9 ?-1st dose tpa/dornase 3/9, 2nd dose 3/10, 3rd dose 3/11 ?P: ?-Abx per ID -- remains on linezolid, unasyn as of 3/11 AM  ?-has had 3d lytics, will order a chest CT and see where we are at. May end up needing a CVTS consult  ?- dc airborne precautions  ? ?Polysubstance abuse  ?-30yr IV heroin and meth use ?P ?-motivated to stop using ?-cont suboxone  ?-rec outpt resources ?-cont trazodone for sleep ? ? ?Remainder of care per primary ? ? ?Best Practice (right click and "Reselect all SmartList Selections" daily)  ?Per primary ? ?Signature:  ? ?4yr MSN, AGACNP-BC ?[facility] ?Amion for pager  ?04/22/2021, 7:22 AM ? ? ? ? ? ?

## 2021-04-23 ENCOUNTER — Telehealth: Payer: Self-pay | Admitting: Student

## 2021-04-23 ENCOUNTER — Inpatient Hospital Stay (HOSPITAL_COMMUNITY): Payer: Self-pay

## 2021-04-23 DIAGNOSIS — J869 Pyothorax without fistula: Secondary | ICD-10-CM

## 2021-04-23 DIAGNOSIS — J90 Pleural effusion, not elsewhere classified: Secondary | ICD-10-CM

## 2021-04-23 LAB — CBC
HCT: 32.6 % — ABNORMAL LOW (ref 39.0–52.0)
Hemoglobin: 10.8 g/dL — ABNORMAL LOW (ref 13.0–17.0)
MCH: 29.3 pg (ref 26.0–34.0)
MCHC: 33.1 g/dL (ref 30.0–36.0)
MCV: 88.3 fL (ref 80.0–100.0)
Platelets: 651 10*3/uL — ABNORMAL HIGH (ref 150–400)
RBC: 3.69 MIL/uL — ABNORMAL LOW (ref 4.22–5.81)
RDW: 15.3 % (ref 11.5–15.5)
WBC: 9.7 10*3/uL (ref 4.0–10.5)
nRBC: 0 % (ref 0.0–0.2)

## 2021-04-23 LAB — COMPREHENSIVE METABOLIC PANEL
ALT: 13 U/L (ref 0–44)
AST: 14 U/L — ABNORMAL LOW (ref 15–41)
Albumin: 2.1 g/dL — ABNORMAL LOW (ref 3.5–5.0)
Alkaline Phosphatase: 59 U/L (ref 38–126)
Anion gap: 5 (ref 5–15)
BUN: 14 mg/dL (ref 6–20)
CO2: 26 mmol/L (ref 22–32)
Calcium: 8.3 mg/dL — ABNORMAL LOW (ref 8.9–10.3)
Chloride: 107 mmol/L (ref 98–111)
Creatinine, Ser: 0.74 mg/dL (ref 0.61–1.24)
GFR, Estimated: >60 mL/min (ref >60)
Glucose, Bld: 84 mg/dL (ref 70–99)
Potassium: 4.8 mmol/L (ref 3.5–5.1)
Sodium: 138 mmol/L (ref 135–145)
Total Bilirubin: <0.1 mg/dL — ABNORMAL LOW (ref 0.3–1.2)
Total Protein: 6.8 g/dL (ref 6.5–8.1)

## 2021-04-23 LAB — MISC LABCORP TEST (SEND OUT): Labcorp test code: 9985

## 2021-04-23 LAB — MAGNESIUM: Magnesium: 2.1 mg/dL (ref 1.7–2.4)

## 2021-04-23 MED ORDER — DOXYCYCLINE HYCLATE 100 MG PO TABS
100.0000 mg | ORAL_TABLET | Freq: Two times a day (BID) | ORAL | Status: DC
  Administered 2021-04-23 – 2021-04-24 (×2): 100 mg via ORAL
  Filled 2021-04-23 (×2): qty 1

## 2021-04-23 MED ORDER — KETOROLAC TROMETHAMINE 15 MG/ML IJ SOLN
30.0000 mg | Freq: Four times a day (QID) | INTRAMUSCULAR | Status: AC | PRN
  Administered 2021-04-23: 30 mg via INTRAVENOUS
  Filled 2021-04-23: qty 2

## 2021-04-23 NOTE — Telephone Encounter (Signed)
Can we set up clinic visit in 4 weeks with me, hospital follow up. Needs CXR on day of visit. ?

## 2021-04-23 NOTE — Procedures (Signed)
Suction removed on left small bore chest tube. Chest tube removed. Manual pressure applied to site. No bleeding or hematoma noted. Site benign. Guaze with Tegaderm dressing applied to site. Patient tolerated procedure well without complaints.  ? ?Dollene Cleveland, NP student ?

## 2021-04-23 NOTE — Progress Notes (Signed)
[facility]  Date of Admission:  04/18/2021     Total days of antibiotics 8         ASSESSMENT:  Mr. Seide's pleural fluid is growing MRSA which is sensitive to doxycycline and will change linezolid to oral doxycycline. Chest tube with 110 cc drainage over the past 24 hours with management per CCM and considering VATs consult with CVTS. Opioid use disorder appears adequately controlled with current dose of suboxone. Will need extended course of antibiotics. Continue remaining medical and supportive care per primary team.   PLAN:  Change antibiotics to oral doxycycline Chest tube management and possible CVTS consult per CCM. Remaining medical and supportive care per primary team.   Principal Problem:   Pleural effusion on left Active Problems:   Hyponatremia   Opioid withdrawal (HCC)   Chest tube in place   Protein-calorie malnutrition, severe    buprenorphine-naloxone  1 tablet Sublingual BID   docusate sodium  100 mg Oral BID   feeding supplement  237 mL Oral TID BM   lidocaine  1 patch Transdermal Q24H   melatonin  5 mg Oral QHS   multivitamin with minerals  1 tablet Oral Daily   mupirocin ointment  1 application. Nasal BID   sodium chloride flush  10 mL Other Q8H   traZODone  50 mg Oral QHS    SUBJECTIVE:  Afebrile overnight with no acute events. Feeling a little better today. Has questions about when he will be able to go home.   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  Respiratory:  Negative for cough, shortness of breath and wheezing.   Cardiovascular:  Negative for chest pain and leg swelling.  Gastrointestinal:  Negative for abdominal pain, constipation, diarrhea, nausea and vomiting.  Skin:  Negative for rash.     OBJECTIVE: Vitals:   04/22/21 1230 04/22/21 1303  04/22/21 2015 04/23/21 0437  BP:  111/67 121/69 124/70  Pulse:  90 81 78  Resp: Temp:  98 F (36.7 C) 99 F (37.2 C) 98.9 F (37.2 C)  TempSrc:  Oral Oral Oral  SpO2:  100% 96% 98%  Weight:      Height:       Body mass index is 22.64 kg/m.  Physical Exam Constitutional:      General: He is not in acute distress.    Appearance: He is well-developed.  Cardiovascular:     Rate and Rhythm: Normal rate and regular rhythm.     Heart sounds: Normal heart sounds.  Pulmonary:     Effort: Pulmonary effort is normal.     Breath sounds: Normal breath sounds.     Comments: Chest tube patent.  Skin:    General: Skin is warm and dry.  Neurological:     Mental Status: He is alert.  Psychiatric:        Mood and Affect: Mood normal.    Lab Results Lab Results  Component Value Date   WBC 9.7 04/23/2021   HGB 10.8 (L) 04/23/2021   HCT 32.6 (L) 04/23/2021   MCV 88.3 04/23/2021   PLT 651 (H) 04/23/2021    Lab Results  Component Value Date   CREATININE 0.74 04/23/2021   BUN 14 04/23/2021   NA 138 04/23/2021   K  4.8 04/23/2021   CL 107 04/23/2021   CO2 26 04/23/2021    Lab Results  Component Value Date   ALT 13 04/23/2021   AST 14 (L) 04/23/2021   ALKPHOS 59 04/23/2021   BILITOT <0.1 (L) 04/23/2021     Microbiology: Recent Results (from the past 240 hour(s))  Blood culture (routine x 2)     Status: Abnormal   Collection Time: 04/16/21  1:35 PM   Specimen: BLOOD  Result Value Ref Range Status   Specimen Description   Final    BLOOD BLOOD LEFT FOREARM Performed at [facility], [address] [place], [place] [address]    Special Requests   Final    BOTTLES DRAWN AEROBIC AND ANAEROBIC Blood Culture adequate volume Performed at [facility], [address], [place], [place] [address]    Culture  Setup Time   Final    GRAM POSITIVE COCCI ANAEROBIC BOTTLE ONLY CRITICAL RESULT CALLED TO, READ BACK BY AND VERIFIED WITH: DEVAN MITCHELL  04/17/21 1442 MW GRAM STAIN REVIEWED-AGREE WITH RESULT    Culture (A)  Final    STAPHYLOCOCCUS EPIDERMIDIS THE SIGNIFICANCE OF ISOLATING THIS ORGANISM FROM A SINGLE SET OF BLOOD CULTURES WHEN MULTIPLE SETS ARE DRAWN IS UNCERTAIN. PLEASE NOTIFY THE MICROBIOLOGY DEPARTMENT WITHIN ONE WEEK IF SPECIATION AND SENSITIVITIES ARE REQUIRED. Performed at [facility], [address] [place], [place] [address]    Report Status 04/19/2021 FINAL  Final  Blood Culture ID Panel (Reflexed)     Status: Abnormal   Collection Time: 04/16/21  1:35 PM  Result Value Ref Range Status   Enterococcus faecalis NOT DETECTED NOT DETECTED Final   Enterococcus Faecium NOT DETECTED NOT DETECTED Final   Listeria monocytogenes NOT DETECTED NOT DETECTED Final   Staphylococcus species DETECTED (A) NOT DETECTED Final    Comment: CRITICAL RESULT CALLED TO, READ BACK BY AND VERIFIED WITH: DEVAN MITCHELL 04/17/21 1143 MW    Staphylococcus aureus (BCID) NOT DETECTED NOT DETECTED Final   Staphylococcus epidermidis DETECTED (A) NOT DETECTED Final    Comment: Methicillin (oxacillin) resistant coagulase negative staphylococcus. Possible blood culture contaminant (unless isolated from more than one blood culture draw or clinical case suggests pathogenicity). No antibiotic treatment is indicated for blood  culture contaminants. CRITICAL RESULT CALLED TO, READ BACK BY AND VERIFIED WITH: DEVAN MITCHELL 04/17/21 1143 MW    Staphylococcus lugdunensis NOT DETECTED NOT DETECTED Final   Streptococcus species NOT DETECTED NOT DETECTED Final   Streptococcus agalactiae NOT DETECTED NOT DETECTED Final   Streptococcus pneumoniae NOT DETECTED NOT DETECTED Final   Streptococcus pyogenes NOT DETECTED NOT DETECTED Final   A.calcoaceticus-baumannii NOT DETECTED NOT DETECTED Final   Bacteroides fragilis NOT DETECTED NOT DETECTED Final   Enterobacterales NOT DETECTED NOT DETECTED Final   Enterobacter cloacae complex NOT DETECTED NOT DETECTED  Final   Escherichia coli NOT DETECTED NOT DETECTED Final   Klebsiella aerogenes NOT DETECTED NOT DETECTED Final   Klebsiella oxytoca NOT DETECTED NOT DETECTED Final   Klebsiella pneumoniae NOT DETECTED NOT DETECTED Final   Proteus species NOT DETECTED NOT DETECTED Final   Salmonella species NOT DETECTED NOT DETECTED Final   Serratia marcescens NOT DETECTED NOT DETECTED Final   Haemophilus influenzae NOT DETECTED NOT DETECTED Final   Neisseria meningitidis NOT DETECTED NOT DETECTED Final   Pseudomonas aeruginosa NOT DETECTED NOT DETECTED Final   Stenotrophomonas maltophilia NOT DETECTED NOT DETECTED Final   Candida albicans NOT DETECTED NOT DETECTED Final   Candida auris NOT  DETECTED NOT DETECTED Final   Candida glabrata NOT DETECTED NOT DETECTED Final   Candida krusei NOT DETECTED NOT DETECTED Final   Candida parapsilosis NOT DETECTED NOT DETECTED Final   Candida tropicalis NOT DETECTED NOT DETECTED Final   Cryptococcus neoformans/gattii NOT DETECTED NOT DETECTED Final   Methicillin resistance mecA/C DETECTED (A) NOT DETECTED Final    Comment: CRITICAL RESULT CALLED TO, READ BACK BY AND VERIFIED WITH: Drusilla KannerDEVAN MITCHELL 04/17/21 1143 MW Performed at [facility], [address] [place], [place] [address]   Blood culture (routine x 2)     Status: None   Collection Time: 04/16/21  1:42 PM   Specimen: BLOOD  Result Value Ref Range Status   Specimen Description BLOOD BLOOD RIGHT HAND  Final   Special Requests   Final    BOTTLES DRAWN AEROBIC AND ANAEROBIC Blood Culture adequate volume   Culture   Final    NO GROWTH 5 DAYS Performed at [facility], [address] [place], [place] [address]    Report Status 04/21/2021 FINAL  Final  Resp Panel by RT-PCR (Flu A&B, Covid) Nasopharyngeal Swab     Status: None   Collection Time: 04/16/21  2:05 PM   Specimen: Nasopharyngeal Swab; Nasopharyngeal(NP) swabs in vial transport medium  Result Value Ref Range Status   SARS  Coronavirus 2 by RT PCR NEGATIVE NEGATIVE Final    Comment: (NOTE) SARS-CoV-2 target nucleic acids are NOT DETECTED.  The SARS-CoV-2 RNA is generally detectable in upper respiratory specimens during the acute phase of infection. The lowest concentration of SARS-CoV-2 viral copies this assay can detect is 138 copies/mL. A negative result does not preclude SARS-Cov-2 infection and should not be used as the sole basis for treatment or other patient management decisions. A negative result may occur with  improper specimen collection/handling, submission of specimen other than nasopharyngeal swab, presence of viral mutation(s) within the areas targeted by this assay, and inadequate number of viral copies(<138 copies/mL). A negative result must be combined with clinical observations, patient history, and epidemiological information. The expected result is Negative.  Fact Sheet for Patients:  BloggerCourse.comhttps://www.fda.gov/media/152166/download  Fact Sheet for Healthcare Providers:  SeriousBroker.ithttps://www.fda.gov/media/152162/download  This test is no t yet approved or cleared by the [place] FDA and  has been authorized for detection and/or diagnosis of SARS-CoV-2 by FDA under an Emergency Use Authorization (EUA). This EUA will remain  in effect (meaning this test can be used) for the duration of the COVID-19 declaration under Section 564(b)(1) of the Act, 21 U.S.C.section 360bbb-3(b)(1), unless the authorization is terminated  or revoked sooner.       Influenza A by PCR NEGATIVE NEGATIVE Final   Influenza B by PCR NEGATIVE NEGATIVE Final    Comment: (NOTE) The Xpert Xpress SARS-CoV-2/FLU/RSV plus assay is intended as an aid in the diagnosis of influenza from Nasopharyngeal swab specimens and should not be used as a sole basis for treatment. Nasal washings and aspirates are unacceptable for Xpert Xpress SARS-CoV-2/FLU/RSV testing.  Fact Sheet for  Patients: BloggerCourse.comhttps://www.fda.gov/media/152166/download  Fact Sheet for Healthcare Providers: SeriousBroker.ithttps://www.fda.gov/media/152162/download  This test is not yet approved or cleared by the [place] FDA and has been authorized for detection and/or diagnosis of SARS-CoV-2 by FDA under an Emergency Use Authorization (EUA). This EUA will remain in effect (meaning this test can be used) for the duration of the COVID-19 declaration under Section 564(b)(1) of the Act, 21 U.S.C. section 360bbb-3(b)(1), unless the authorization is terminated or revoked.  Performed at [facility], [address] [place], [place] [address]   Body fluid culture w Gram Stain     Status: None   Collection Time: 04/16/21  3:55 PM   Specimen: PATH Cytology Pleural fluid  Result Value Ref Range Status   Specimen Description   Final    PLEURAL Performed at [facility], [address] [place], [place] [address]    Special Requests   Final    NONE Performed at [facility], [address] [place], [place] [address]    Gram Stain RARE WBC SEEN NO ORGANISMS SEEN   Final   Culture   Final    NO GROWTH Performed at [facility], [address], [place], [place] [address]    Report Status 04/20/2021 FINAL  Final  MT-RIF NAA W Cult, Non-Sputum     Status: None (Preliminary result)   Collection Time: 04/16/21  3:55 PM   Specimen: PATH Cytology Pleural fluid  Result Value Ref Range Status   Source PLEURAL  Final    Comment: Performed at [facility], [address] [place], [place] [address]   Specimen Source Pleural fluid  Final   M tuberculosis complex Comment NOT DETECTED Final    Comment: (NOTE) Mycobacterium tuberculosis complex (MTBC) NOT detected. This test was developed and its performance characteristics determined by [facility]. It has not been cleared or approved by the Food and Drug Administration.    Rifampin Comment Susceptible Final    Comment:  (NOTE) Because Mycobacterium tuberculosis complex (MTBC) was not detected, no rifampin determination is possible. This test was developed and its performance characteristics determined by [facility]. It has not been cleared or approved by the Food and Drug Administration.    AFB Specimen Processing Concentration  Final    Comment: (NOTE) Performed At: [facility] [address] [place], [place] [id number] Jolene Schimke MD EU:[id number]    Acid Fast Culture PENDING  Incomplete  MRSA Next Gen by PCR, Nasal     Status: Abnormal   Collection Time: 04/17/21  9:43 AM   Specimen: Nasal Mucosa; Nasal Swab  Result Value Ref Range Status   MRSA by PCR Next Gen DETECTED (A) NOT DETECTED Final    Comment: RESULT CALLED TO, READ BACK BY AND VERIFIED WITH: Sung Amabile, RN 04/17/21 1102 MW (NOTE) The GeneXpert MRSA Assay (FDA approved for NASAL specimens only), is one component of a comprehensive MRSA colonization surveillance program. It is not intended to diagnose MRSA infection nor to guide or monitor treatment for MRSA infections. Test performance is not FDA approved in patients less than 62 years old. Performed at [facility], [address] [place], [place] [address]   Acid Fast Smear (AFB)     Status: None   Collection Time: 04/19/21 11:00 AM   Specimen: PATH Cytology Pleural fluid  Result Value Ref Range Status   AFB Specimen Processing Concentration  Final   Acid Fast Smear Negative  Final    Comment: (NOTE) Performed At: [facility] [address] [place], [place] [id number] Jolene Schimke MD OI:[id number]    Source (AFB) PLEURAL  Final    Comment: FLUID Performed at [facility], [address], [place], [place] [address]   Aerobic/Anaerobic Culture w Gram Stain (surgical/deep wound)     Status: None (Preliminary result)   Collection Time: 04/19/21 11:00 AM   Specimen: Pleura  Result Value Ref Range Status   Specimen Description PLEURAL  FLUID  Final  Special Requests Normal  Final   Gram Stain   Final    WBC PRESENT, PREDOMINANTLY PMN NO ORGANISMS SEEN CYTOSPIN SMEAR Performed at [facility], [address] [place], [place] [address]    Culture   Final    RARE METHICILLIN RESISTANT STAPHYLOCOCCUS AUREUS CRITICAL RESULT CALLED TO, READ BACK BY AND VERIFIED WITH: RN G.COOPER ON [id number] AT 1444 BY E.PARRISH NO ANAEROBES ISOLATED; CULTURE IN PROGRESS FOR 5 DAYS    Report Status PENDING  Incomplete   Organism ID, Bacteria METHICILLIN RESISTANT STAPHYLOCOCCUS AUREUS  Final      Susceptibility   Methicillin resistant staphylococcus aureus - MIC*    CIPROFLOXACIN >=8 RESISTANT Resistant     ERYTHROMYCIN >=8 RESISTANT Resistant     GENTAMICIN <=0.5 SENSITIVE Sensitive     OXACILLIN >=4 RESISTANT Resistant     TETRACYCLINE <=1 SENSITIVE Sensitive     VANCOMYCIN <=0.5 SENSITIVE Sensitive     TRIMETH/SULFA 160 RESISTANT Resistant     CLINDAMYCIN >=8 RESISTANT Resistant     RIFAMPIN <=0.5 SENSITIVE Sensitive     Inducible Clindamycin NEGATIVE Sensitive     * RARE METHICILLIN RESISTANT STAPHYLOCOCCUS AUREUS     Marcos Eke, NP [facility]  04/23/2021  11:51 AM

## 2021-04-23 NOTE — Progress Notes (Signed)
? ?  NAME:  Lance Marquez, MRN:  [id number], DOB:  05-10-1995, LOS: 5 ?ADMISSION DATE:  04/18/2021, CONSULTATION DATE:  04/19/21 ?REFERRING MD:  TRH, CHIEF COMPLAINT:  chest pain  ? ?History of Present Illness:  ?26 year old man with hx of IVDA, recent admit for septic arthritis presenting with pleurisy found to have L loculated effusion, MRSE bacteremia.  Underwent a thoracentesis at [facility] then sent to [facility] for a TCTS consult.  TCTS recommended consulting PCCM so we are now seeing. ? ?Denies any current chest pain or SOB.  Has never had breathing issues before.   ? ?Pertinent  Medical History  ?IVDA ?Asthma ? ?Significant Hospital Events: ?Including procedures, antibiotic start and stop dates in addition to other pertinent events   ?3/6 admit [facility], thora ?3/8 transferred to [facility] ?3/9 TCTS and PCCM consult, pigtail. Dose 1 tpa/dornase ?3/10 second dose of pleural tPA/Dornase given  ?3/11 Will instill third and hopefully last dose of pleural tPA/Dornase  ? ?Interim History / Subjective:  ?No acute events. Chest tube output only 30cc last 24 hours. CT yesterday with mall residual left sided effusion and scattered loculations of pleural gas.  ? ?Objective   ?Blood pressure 124/70, pulse 78, temperature 98.9 ?F (37.2 ?C), temperature source Oral, resp. rate 14, height 5\' 9"  (1.753 m), weight 69.5 kg, SpO2 98 %. ?   ?   ? ?Intake/Output Summary (Last 24 hours) at 04/23/2021 0914 ?Last data filed at 04/23/2021 0600 ?Gross per 24 hour  ?Intake 1270 ml  ?Output 2610 ml  ?Net -1340 ml  ? ? ?Filed Weights  ? 04/19/21 0557 04/21/21 0410 04/22/21 0530  ?Weight: 68.5 kg 70 kg 69.5 kg  ? ? ?Examination: ?General: young adult in NAD ?HEENT: [place]/AT, PERRL, no JVD ?Neuro: alert, oriented, non-focal ?CV: RRR no MRG ?PULM:  Diminished L base. Otherwise clear no distress.  ?GI: soft, non-tender, non-distended ?Extremities: no acute deformity or ROM limitation ?Skin: grossly intact.  ? ?Resolved Hospital Problem list   ?N/a ? ?Assessment &  Plan:  ?Empyema on the left > MRSA ?Small Left sided ptx -- trapped lung  ?Hx IVDU ?Hx septic arthritis  ?TB ruled out  ?-chest tube placed 3/9 ?-1st dose tpa/dornase 3/9, 2nd dose 3/10, 3rd dose 3/11 ?P: ?-Abx per ID -- narrow to linezolid  ?-s/p pleural lytic x 3. CT with small residual effusion ?-Considering CVTS consult to see if VATs would be indicated. Will dicsuss with attending. Tube output 110 cc last 24 hours.  ? ?Polysubstance abuse  ?-48yr IV heroin and meth use ?P ?-motivated to stop using ?-cont suboxone  ?-rec outpt resources ?-cont trazodone for sleep ? ? ?Remainder of care per primary ? ? ?Best Practice (right click and "Reselect all SmartList Selections" daily)  ?Per primary ? ?Signature:  ? ? ?Georgann Housekeeper, AGACNP-BC ?[facility] ? ?See Amion for personal pager ?PCCM on call pager [phone] until 7pm. ?Please call Elink 7p-7a. [phone] ? ?04/23/2021 9:20 AM ? ? ? ? ? ? ? ? ? ?

## 2021-04-23 NOTE — Progress Notes (Signed)
PROGRESS NOTE    [name]  OIN:[id number] DOB: 08-05-1995 DOA: 04/18/2021 PCP: Oneita Hurt, No    Brief Narrative:  Lance Marquez is a 26 y.o. male with past medical history of septic arthritis, IV drug abuse presented to hospital with left lateral chest wall pain for 3 days which was severe in intensity with some fever chills and productive yellowish sputum.  In the ED, patient had a chest x-ray which showed large left pleural effusion with adjacent consolidation.  CT surgery was consulted and subsequently pulmonary was consulted.  Patient underwent chest tube placement by pulmonary.  Pulmonary managing chest tube placement.  Patient has undergone instillation of fibrinolytics x3. Infectious disease was also consulted for antibiotic management.  Patient was continued on Suboxone protocol.  Pleural smear negative for AFB but positive for MRSA.  ID recommending doxycycline.  Pulmonary following the patient regarding chest tube management.     Assessment and Plan: * Pleural effusion on left Complicated pneumonia with loculated empyema.   PCCM and infectious disease on board.  Currently on Unasyn and Zyvox.  Status post chest tube placement with instillation of fibrinolytics x3.   Pleural fluid culture with MRSA.  ID recommends doxycycline at this time.  AFB smear negative.  PCCM on board and patient is on chest tube.  CT scan chest done yesterday showed some small effusion.  Further plan for chest tube versus CT surgery intervention as per pulmonary.  Hyponatremia Improved, sodium at 138  Protein-calorie malnutrition, severe Present on admission.  Seen by dietitian, continue Ensure..  Encourage oral nutrition.  Chest tube in place Chest tube management as per pulmonary.  Opioid withdrawal (HCC) IV drug abuse and dependence.  History of IV heroin usage.   on Suboxone protocol.  No active withdrawal symptoms at this time.  Continue Toradol for pain relief at this time.     DVT  prophylaxis: SCDs Start: 04/18/21 1929   Code Status:     Code Status: Full Code  Disposition: Home  Status is: Inpatient  Remains inpatient appropriate because: Complicated pleural effusion, status post chest tube placement, IV antibiotic, administration of fibrinolytics   Family Communication:  Spoke with the patient at bedside  Consultants:  Infectious disease Pulmonary  Procedures:  Chest tube  insertion with imaging guidance on 04/19/2021.   Fibrinolysis of complicated pleural effusion on 04/19/2021 and on 3/11  Antimicrobials:   Doxycycline 3/13> Unasyn and linezolid- dced  Anti-infectives (From admission, onward)    Start     Dose/Rate Route Frequency Ordered Stop   04/23/21 2200  doxycycline (VIBRA-TABS) tablet 100 mg        100 mg Oral Every 12 hours 04/23/21 1158     04/19/21 1030  Ampicillin-Sulbactam (UNASYN) 3 g in sodium chloride 0.9 % 100 mL IVPB  Status:  Discontinued        3 g 200 mL/hr over 30 Minutes Intravenous Every 6 hours 04/19/21 0935 04/22/21 1343   04/18/21 2200  linezolid (ZYVOX) IVPB 600 mg  Status:  Discontinued       Note to Pharmacy: (has been on Zyvox per ID recs during hospitalization at [facility] leading up to today's transfer to [facility]; documented allergy to Vanc)   600 mg 300 mL/hr over 60 Minutes Intravenous Every 12 hours 04/18/21 1959 04/23/21 1158   04/18/21 2200  piperacillin-tazobactam (ZOSYN) IVPB 3.375 g  Status:  Discontinued        3.375 g 12.5 mL/hr over 240 Minutes Intravenous Every 8 hours 04/18/21  2035 04/19/21 0935      Subjective:  Today, patient was seen and examined at bedside.  Patient denies any fever, chills or rigor, nausea vomiting increasing shortness of breath or dyspnea.   Objective: Vitals:   04/22/21 1230 04/22/21 1303 04/22/21 2015 04/23/21 0437  BP:  111/67 121/69 124/70  Pulse:  90 81 78  Resp: 13 16 16 14   Temp:  98 F (36.7 C) 99 F (37.2 C) 98.9 F (37.2 C)  TempSrc:  Oral Oral Oral  SpO2:  100% 96%  98%  Weight:      Height:        Intake/Output Summary (Last 24 hours) at 04/23/2021 1314 Last data filed at 04/23/2021 1248 Gross per 24 hour  Intake 1458.87 ml  Output 2610 ml  Net -1151.13 ml   Filed Weights   04/19/21 0557 04/21/21 0410 04/22/21 0530  Weight: 68.5 kg 70 kg 69.5 kg   Body mass index is 22.64 kg/m.   Physical Examination:  General:  Average built, not in obvious distress HENT:   No scleral pallor or icterus noted. Oral mucosa is moist.  Chest: Left-sided chest tube in place.  Diminished breath sounds on the left base.  CVS: S1 &S2 heard. No murmur.  Regular rate and rhythm. Abdomen: Soft, nontender, nondistended.  Bowel sounds are heard.   Extremities: No cyanosis, clubbing or edema.  Peripheral pulses are palpable. Psych: Alert, awake and oriented, normal mood CNS:  No cranial nerve deficits.  Power equal in all extremities.   Skin: Warm and dry.  Injection marks and tattoos    Data Reviewed:   CBC: Recent Labs  Lab 04/18/21 0943 04/19/21 0336 04/20/21 0209 04/21/21 1630 04/23/21 0205  WBC 14.8* 15.3* 16.7* 12.4* 9.7  NEUTROABS  --  8.9*  --   --   --   HGB 11.3* 11.7* 12.3* 10.5* 10.8*  HCT 33.4* 35.3* 37.6* 31.8* 32.6*  MCV 85.2 86.7 87.0 87.8 88.3  PLT 517* 530* 616* 624* 651*    Basic Metabolic Panel: Recent Labs  Lab 04/17/21 0933 04/19/21 0336 04/20/21 0209 04/21/21 1630 04/23/21 0205  NA 135 137 133* 137 138  K 4.7 3.6 3.6 4.0 4.8  CL 102 104 99 102 107  CO2 25 24 25 27 26   GLUCOSE 103* 65* 113* 100* 84  BUN 7 5* 6 14 14   CREATININE 0.64 0.75 0.64 0.85 0.74  CALCIUM 8.3* 8.4* 8.2* 8.0* 8.3*  MG  --  2.0 2.1 2.1 2.1  PHOS  --   --  4.2  --   --     Liver Function Tests: Recent Labs  Lab 04/19/21 0336 04/21/21 1630 04/23/21 0205  AST 13* 14* 14*  ALT 15 11 13   ALKPHOS 82 57 59  BILITOT <0.1* 0.2* <0.1*  PROT 7.2 6.0* 6.8  ALBUMIN 2.2* 1.8* 2.1*     Radiology Studies: CT CHEST WO CONTRAST  Result Date:  04/22/2021 CLINICAL DATA:  Empyema EXAM: CT CHEST WITHOUT CONTRAST TECHNIQUE: Multidetector CT imaging of the chest was performed following the standard protocol without IV contrast. RADIATION DOSE REDUCTION: This exam was performed according to the departmental dose-optimization program which includes automated exposure control, adjustment of the mA and/or kV according to patient size and/or use of iterative reconstruction technique. COMPARISON:  Chest radiograph 04/21/2021 and CT chest 04/16/2021 FINDINGS: Cardiovascular: Unremarkable Mediastinum/Nodes: Scattered mediastinal lymph nodes including a 1.0 cm pretracheal lymph node (formerly 1.1 cm). These are most likely reactive. Lungs/Pleura: Left pleural drainage  catheter in place. Compared to 04/16/2021 there is substantial reduction in size of the left pleural fluid collection, although there remains a small pleural effusion along with scattered pleural gas on the left Ed there is atelectasis in the left lower lobe posteriorly, disproportionate to the size of the pleural effusion. Mild dependent atelectasis in the right lower lobe. Upper Abdomen: Unremarkable Musculoskeletal: Mild dextroconvex thoracic scoliosis. IMPRESSION: 1. Small residual left pleural effusion and scattered loculations of left pleural gas. Pigtail pleural drainage catheter remains in place. 2. Mild atelectasis in the left lower lobe dependently. There is a small amount of dependent right lower lobe atelectasis. 3. Borderline enlarged reactive lymph nodes in the mediastinum. 4. Mild dextroconvex thoracic scoliosis. Electronically Signed   By: Gaylyn RongWalter  Liebkemann M.D.   On: 04/22/2021 09:23   DG CHEST PORT 1 VIEW  Result Date: 04/23/2021 CLINICAL DATA:  Pleural effusion with left chest tube. EXAM: PORTABLE CHEST 1 VIEW COMPARISON:  Chest CT yesterday, portable chest 04/21/2021. FINDINGS: Left lateral basal pigtail chest tube remains stable in positioning. Small underlying left pleural  effusion and overlying hazy atelectasis or pneumonitis continues to be seen. The CT demonstrated small air pockets in the fluid but this is not well re-evaluated with portable chest technique. There is mild elevation of the left hemidiaphragm once again. The remaining lungs clear. The cardiac size is normal. The mediastinum is stable. Slight thoracic dextroscoliosis. IMPRESSION: Stable left lateral basal chest tube positioning, small pleural effusion and left basilar hazy atelectasis or consolidation. No new abnormality. Electronically Signed   By: Almira BarKeith  Chesser M.D.   On: 04/23/2021 07:00      LOS: 5 days    Joycelyn DasLaxman Shaterra Sanzone, MD [facility] 04/23/2021, 1:14 PM

## 2021-04-24 ENCOUNTER — Other Ambulatory Visit (HOSPITAL_COMMUNITY): Payer: Self-pay

## 2021-04-24 LAB — AEROBIC/ANAEROBIC CULTURE W GRAM STAIN (SURGICAL/DEEP WOUND): Special Requests: NORMAL

## 2021-04-24 MED ORDER — DOXYCYCLINE HYCLATE 100 MG PO TABS
100.0000 mg | ORAL_TABLET | Freq: Two times a day (BID) | ORAL | 0 refills | Status: AC
  Filled 2021-04-24: qty 56, 28d supply, fill #0

## 2021-04-24 MED ORDER — IBUPROFEN 600 MG PO TABS
600.0000 mg | ORAL_TABLET | Freq: Three times a day (TID) | ORAL | 2 refills | Status: DC | PRN
  Filled 2021-04-24: qty 30, 10d supply, fill #0

## 2021-04-24 MED ORDER — FLUCONAZOLE 100 MG PO TABS
100.0000 mg | ORAL_TABLET | Freq: Every day | ORAL | 0 refills | Status: AC
  Filled 2021-04-24: qty 3, 3d supply, fill #0

## 2021-04-24 MED ORDER — POLYETHYLENE GLYCOL 3350 17 GM/SCOOP PO POWD
17.0000 g | Freq: Every day | ORAL | 0 refills | Status: DC | PRN
  Filled 2021-04-24: qty 238, 14d supply, fill #0

## 2021-04-24 NOTE — Telephone Encounter (Signed)
Lm with patient's grandfather requesting that patient contact our office.  ? ?

## 2021-04-24 NOTE — Plan of Care (Signed)
  Problem: Education: Goal: Knowledge of General Education information will improve Description: Including pain rating scale, medication(s)/side effects and non-pharmacologic comfort measures Outcome: Adequate for Discharge   Problem: Health Behavior/Discharge Planning: Goal: Ability to manage health-related needs will improve Outcome: Adequate for Discharge   Problem: Clinical Measurements: Goal: Ability to maintain clinical measurements within normal limits will improve Outcome: Adequate for Discharge Goal: Will remain free from infection Outcome: Adequate for Discharge Goal: Diagnostic test results will improve Outcome: Adequate for Discharge Goal: Respiratory complications will improve Outcome: Adequate for Discharge Goal: Cardiovascular complication will be avoided Outcome: Adequate for Discharge   Problem: Activity: Goal: Risk for activity intolerance will decrease Outcome: Adequate for Discharge   Problem: Nutrition: Goal: Adequate nutrition will be maintained Outcome: Adequate for Discharge   Problem: Coping: Goal: Level of anxiety will decrease Outcome: Adequate for Discharge   

## 2021-04-24 NOTE — TOC Initial Note (Signed)
Transition of Care (TOC) - Initial/Assessment Note  ? ? ?Patient Details  ?Name: Lance Marquez ?MRN: [id number] ?Date of Birth: 1995-12-31 ? ?Transition of Care (TOC) CM/SW Contact:    ?Graves-Bigelow, Lamar Laundry, RN ?Phone Number: ?04/24/2021, 11:44 AM ? ?Clinical Narrative: Case Manager spoke with patient regarding PCP needs. Patient is agreeable to an appointment in [facility]. Appointment scheduled at the [facility] and placed on the AVS. Case Manager assisted with medications via [facility] program. No further needs identified at this time.               ? ? ?Expected Discharge Plan: Home/Self Care ?Barriers to Discharge: No Barriers Identified ? ? ?Patient Goals and CMS Choice ?Patient states their goals for this hospitalization and ongoing recovery are:: to return home. ?  ?Choice offered to / list presented to : NA ? ?Expected Discharge Plan and Services ?Expected Discharge Plan: Home/Self Care ?In-house Referral: Financial Counselor ?Discharge Planning Services: CM Consult ?Post Acute Care Choice: NA ?Living arrangements for the past 2 months: Single Family Home ?Expected Discharge Date: 04/24/21               ?  ?DME Agency: NA ?  ?  ?  ?HH Arranged: NA ?  ?  ?  ?  ? ?Prior Living Arrangements/Services ?Living arrangements for the past 2 months: Single Family Home ?Lives with:: Self ?Patient language and need for interpreter reviewed:: Yes ?       ?Need for Family Participation in Patient Care: No (Comment) ?Care giver support system in place?: No (comment) ?  ?Criminal Activity/Legal Involvement Pertinent to Current Situation/Hospitalization: No - Comment as needed ? ?Activities of Daily Living ?Home Assistive Devices/Equipment: None ?ADL Screening (condition at time of admission) ?Patient's cognitive ability adequate to safely complete daily activities?: Yes ?Is the patient deaf or have difficulty hearing?: No ?Does the patient have difficulty seeing, even when wearing glasses/contacts?:  No ?Does the patient have difficulty concentrating, remembering, or making decisions?: No ?Patient able to express need for assistance with ADLs?: Yes ?Does the patient have difficulty dressing or bathing?: No ?Independently performs ADLs?: Yes (appropriate for developmental age) ?Does the patient have difficulty walking or climbing stairs?: No ?Weakness of Legs: None ?Weakness of Arms/Hands: None ? ? ?Emotional Assessment ?Appearance:: Appears stated age ?Attitude/Demeanor/Rapport: Engaged ?Affect (typically observed): Appropriate ?Orientation: : Oriented to Self, Oriented to Place, Oriented to  Time, Oriented to Situation ?Alcohol / Substance Use: Not Applicable ?Psych Involvement: No (comment) ? ?Admission diagnosis:  Loculated pleural effusion [J90] ?Patient Active Problem List  ? Diagnosis Date Noted  ? Protein-calorie malnutrition, severe 04/20/2021  ? Opioid withdrawal (HCC) 04/19/2021  ? Chest tube in place   ? Loculated pleural effusion 04/18/2021  ? Pleural effusion on left 04/16/2021  ? Drug abuse and dependence (HCC)   ? Lobar pneumonia (HCC)   ? IVDU (intravenous drug user) 03/23/2021  ? Hyponatremia 03/23/2021  ? Leukocytosis 03/23/2021  ? Septic arthritis of knee (HCC) 03/22/2021  ? Suicidal behavior   ? Diarrhea   ? C. difficile diarrhea   ? Parasites in stool   ? ?PCP:  Pcp, No ?Pharmacy:   ?[id number] - [place], [place] - [address] ?[address] ?[place] [address] ?Phone: [phone] Fax: [phone] ? ?[facility] ?[address] ?[place] [address] ?Phone: [phone] Fax: [phone] ? ? ? ?Readmission Risk Interventions ?No flowsheet data found. ? ? ?

## 2021-04-24 NOTE — Discharge Summary (Signed)
?Physician Discharge Summary ?  ?Patient: Lance Marquez MRN: [id number] DOB: 1995-09-08  ?Admit date:     04/18/2021  ?Discharge date: 04/24/21  ?Discharge Physician: Rebekah ChesterfieldLaxman Robi Mitter  ? ?PCP: Pcp, No  ? ?Recommendations at discharge:  ? ?Follow-up with Dr. Thora LanceMeier pulmonary for pleural effusion follow-up, status post chest tube placement (office to schedule) ?Follow-up with primary care physician in 1 week.  Check CBC BMP magnesium and LFT in the next visit. ?Patient would benefit from substance abuse treatment as outpatient. ? ?Discharge Diagnoses: ?Principal Problem: ?  Pleural effusion on left ?Active Problems: ?  Hyponatremia ?  Opioid withdrawal (HCC) ?  Chest tube in place ?  Protein-calorie malnutrition, severe ? ?Resolved Problems: ?  * No resolved hospital problems. * ? ?Hospital Course: ?Lance Marquez is a 26 y.o. male with past medical history of septic arthritis, IV drug abuse presented to hospital with left lateral chest wall pain for 3 days which was severe in intensity with some fever chills and productive yellowish sputum.  In the ED, patient had a chest x-ray which showed large left pleural effusion with adjacent consolidation.  CT surgery was initially consulted who recommended pulmonary consultation.  Pulmonary was consulted and patient underwent chest tube placement with  instillation of fibrinolytics x3. Infectious disease was also consulted for antibiotic management.  ? ?Assessment and Plan: ?* Pleural effusion on left ?Complicated pneumonia with loculated empyema.   Pulmonary and infectious disease was consulted.  Patient was initially on Unasyn and Zyvox.  Patient underwent  chest tube placement with instillation of fibrinolytics x3.   Pleural fluid culture with MRSA.  ID recommends doxycycline at this time for 4 weeks..  AFB smear was negative.  CT scan chest done after chest tube placement showed improvement with residual small effusion.  Chest tube has been removed 04/23/2021.  At  this time plan is to follow-up with pulmonary as outpatient.  ID recommends 4 weeks of oral doxycycline.   ? ?Hyponatremia ?Improved, sodium at 138 ? ?Protein-calorie malnutrition, severe ?Present on admission.  Seen by dietitian, received Ensure during hospitalization.  Encouraged oral nutrition on discharge. ? ?Chest tube in place ?Has been removed 04/23/2021.  We will follow-up in the pulmonary clinic after discharge. ? ?Opioid withdrawal (HCC) ?IV drug abuse and dependence.  History of IV heroin usage.   Received Suboxone during hospitalization.  No active withdrawal symptoms at this time.  Patient is motivated to quit heroin as outpatient.  Spoke with the transition of care team to provide substance abuse resources.  Patient did not have any withdrawal symptoms during hospitalization. ? ? ?Consultants:  ?Infectious disease ?Pulmonary ? ?Procedures performed:  ?Chest tube  insertion with imaging guidance on 04/19/2021.   ?Fibrinolysis of complicated pleural effusion on 04/19/2021 and on 3/11 ?   ?Disposition: Home ?Diet recommendation:  ?Discharge Diet Orders (From admission, onward)  ? ?  Start     Ordered  ? 04/24/21 0000  Diet general       ? 04/24/21 1035  ? ?  ?  ? ?  ? ?Regular diet ?DISCHARGE MEDICATION: ?Allergies as of 04/24/2021   ? ?   Reactions  ? Vancomycin Rash  ? 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 ?  ? ?  ?Medication List  ?  ? ?STOP taking these medications   ? ?linezolid 600 MG/300ML IVPB ?Commonly known as: ZYVOX ?  ?melatonin 5 MG Tabs ?  ?piperacillin-tazobactam 3.375 GM/50ML IVPB ?Commonly known as: ZOSYN ?  ?  senna-docusate 8.6-50 MG tablet ?Commonly known as: Senokot-S ?  ?traZODone 50 MG tablet ?Commonly known as: DESYREL ?  ? ?  ? ?TAKE these medications   ? ?acetaminophen 325 MG tablet ?Commonly known as: TYLENOL ?Take 2 tablets (650 mg total) by mouth every 6 (six) hours as needed for mild pain, headache or fever. ?  ?buprenorphine-naloxone 8-2 mg Subl SL tablet ?Commonly known as:  SUBOXONE ?Place 1 tablet under the tongue 2 (two) times daily. ?  ?doxycycline 100 MG tablet ?Commonly known as: VIBRA-TABS ?Take 1 tablet (100 mg total) by mouth every 12 (twelve) hours for 28 days. ?  ?enoxaparin 40 MG/0.4ML injection ?Commonly known as: LOVENOX ?Inject 0.4 mLs (40 mg total) into the skin daily. ?  ?fluconazole 100 MG tablet ?Commonly known as: Diflucan ?Take 1 tablet (100 mg total) by mouth daily for 3 days. ?  ?ibuprofen 600 MG tablet ?Commonly known as: ADVIL ?Take 1 tablet (600 mg total) by mouth every 8 (eight) hours as needed for mild pain, moderate pain or fever. ?  ?lidocaine 5 % ?Commonly known as: LIDODERM ?Place 1 patch onto the skin daily. Remove & Discard patch within 12 hours or as directed by MD ?  ?polyethylene glycol 17 g packet ?Commonly known as: MIRALAX / GLYCOLAX ?Take 17 g by mouth daily as needed for moderate constipation or mild constipation. ?  ? ?  ? ? Follow-up Information   ? ? Omar Person, MD Follow up in 4 week(s).   ?Specialty: Pulmonary Disease ?Why: office to contact ?Contact information: ?[address] ?[place] [address] ?[phone] ? ? ?  ?  ? ? Primary care provider Follow up in 1 week(s).   ? ?  ?  ? ?  ?  ? ?  ? ?Subjective ?Today, patient was seen and examined at bedside.  Feels okay.  Denies any shortness of breath chest pain fever chills or rigor. ? ?Discharge Exam: ?Filed Weights  ? 04/21/21 0410 04/22/21 0530 04/24/21 0540  ?Weight: 70 kg 69.5 kg 68.9 kg  ? ?Vitals with BMI 04/24/2021 04/24/2021 04/23/2021  ?Height - - -  ?Weight - 151 lbs 14 oz -  ?BMI - 22.42 -  ?Systolic 116 116 914  ?Diastolic 68 68 71  ?Pulse - 98 71  ?  ?General:  Average built, not in obvious distress ?HENT:   No scleral pallor or icterus noted. Oral mucosa is moist.  ?Chest:  Clear breath sounds.  Diminished breath sounds bilaterally. No crackles or wheezes.  ?CVS: S1 &S2 heard. No murmur.  Regular rate and rhythm. ?Abdomen: Soft, nontender, nondistended.  Bowel sounds  are heard.   ?Extremities: No cyanosis, clubbing or edema.  Peripheral pulses are palpable. ?Psych: Alert, awake and oriented, normal mood ?CNS:  No cranial nerve deficits.  Power equal in all extremities.   ?Skin: Warm and dry.  No rashes noted. ? ? ?Condition at discharge: good ? ?The results of significant diagnostics from this hospitalization (including imaging, microbiology, ancillary and laboratory) are listed below for reference.  ? ?Imaging Studies: ?DG Chest 1 View ? ?Result Date: 04/19/2021 ?CLINICAL DATA:  Chest pain today. Chest tube in place. History of pleural effusion. EXAM: CHEST  1 VIEW COMPARISON:  AP chest earlier same day 04/19/2021 FINDINGS: New left lateral lower hemithorax pigtail drainage catheter. Interval decrease in mild-to-moderate layering left pleural effusion. Cardiac silhouette and mediastinal contours are within normal limits. The right lung is clear. No pneumothorax. Mild multilevel degenerative disc changes of  the thoracic spine. Metallic possible jewelry again overlies the left lower hemithorax. IMPRESSION: Left lateral lower hemithorax pigtail drainage catheter with slight decrease in mild-to-moderate left pleural effusion. Electronically Signed   By: Neita Garnet M.D.   On: 04/19/2021 11:36  ? ?DG Chest 2 View ? ?Result Date: 04/16/2021 ?CLINICAL DATA:  Left-sided chest pain. EXAM: CHEST - 2 VIEW COMPARISON:  July 13, 2015 FINDINGS: The heart size and mediastinal contours are partially obscured. Large left pleural effusion with adjacent airspace consolidation. The visualized skeletal structures are unremarkable. Metallic left nipple ornamentation. IMPRESSION: Large left pleural effusion with adjacent airspace consolidation. Electronically Signed   By: Maudry Mayhew M.D.   On: 04/16/2021 09:45  ? ?CT CHEST WO CONTRAST ? ?Result Date: 04/22/2021 ?CLINICAL DATA:  Empyema EXAM: CT CHEST WITHOUT CONTRAST TECHNIQUE: Multidetector CT imaging of the chest was performed following the  standard protocol without IV contrast. RADIATION DOSE REDUCTION: This exam was performed according to the departmental dose-optimization program which includes automated exposure control, adjustment of the mA an

## 2021-04-24 NOTE — Progress Notes (Signed)
? ? ?[facility] ? ?Date of Admission:  04/18/2021    ? ?Total days of antibiotics 9 ?        ?ASSESSMENT: ? ?Lance Marquez's chest tube has been removed and with no further need for surgical evaluation will treat with 4 weeks of doxycycline to be [facility] to [facility]. Has follow up with Pulmonology and chest x-ray. Can follow up with ID if needed. Has new rash that appears consistent with tinea cruris and will continue using nystatin powder and will treat with 3 days of 200 mg PO fluconazole. Ok for discharge from ID standpoint. Remaining medical and supportive care as needed per primary team.  ? ?PLAN: ? ?Continue doxycycline for 4 weeks from chest tube removal.  ?Start fluconazole 200 mg PO daily x 3 days for suspected tinea cruris and continue nystatin powder.  ?Follow up with Pulmonology for chest x-ray ?[facility] Pharmacy to bring medications to bedside  ?Ok for discharge from ID standpoint. Follow up with ID as needed.  ? ? ?Principal Problem: ?  Pleural effusion on left ?Active Problems: ?  Hyponatremia ?  Opioid withdrawal (HCC) ?  Chest tube in place ?  Protein-calorie malnutrition, severe ? ? ? buprenorphine-naloxone  1 tablet Sublingual BID  ? docusate sodium  100 mg Oral BID  ? doxycycline  100 mg Oral Q12H  ? feeding supplement  237 mL Oral TID BM  ? lidocaine  1 patch Transdermal Q24H  ? melatonin  5 mg Oral QHS  ? multivitamin with minerals  1 tablet Oral Daily  ? traZODone  50 mg Oral QHS  ? ? ?SUBJECTIVE: ? ?Afebrile overnight with no acute events. Has a rash in his groin and has been using nystatin powder. Chest tube removed.  ? ?Allergies  ?Allergen Reactions  ? Vancomycin Rash  ?  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 ? ? ?Review of Systems: ?Review of Systems  ?Constitutional:  Negative for chills, fever and weight loss.  ?Respiratory:  Negative for cough, shortness of breath and wheezing.   ?Cardiovascular:  Negative for chest pain and  leg swelling.  ?Gastrointestinal:  Negative for abdominal pain, constipation, diarrhea, nausea and vomiting.  ?Skin:  Positive for rash.  ? ? ? ?OBJECTIVE: ?Vitals:  ? 04/23/21 1333 04/23/21 2100 04/24/21 0540 04/24/21 0541  ?BP: 112/65 127/71 116/68 116/68  ?Pulse: 82 71 98   ?Resp: 15 11 13 13   ?Temp: 98.6 ?F (37 ?C) 98.3 ?F (36.8 ?C) 99.1 ?F (37.3 ?C)   ?TempSrc: Oral Oral Oral   ?SpO2: 95% 96% 97%   ?Weight:   68.9 kg   ?Height:      ? ?Body mass index is 22.43 kg/m?. ? ?Physical Exam ?Constitutional:   ?   General: He is not in acute distress. ?   Appearance: He is well-developed.  ?Cardiovascular:  ?   Rate and Rhythm: Normal rate and regular rhythm.  ?   Heart sounds: Normal heart sounds.  ?Pulmonary:  ?   Effort: Pulmonary effort is normal.  ?   Breath sounds: Normal breath sounds.  ?Skin: ?   General: Skin is warm and dry.  ?   Findings: Rash (tinea cruris appearing rash in groin) present.  ?Neurological:  ?   Mental Status: He is alert.  ?Psychiatric:     ?   Mood and Affect: Mood normal.  ? ? ?Lab Results ?Lab Results  ?Component Value Date  ?  WBC 9.7 04/23/2021  ? HGB 10.8 (L) 04/23/2021  ? HCT 32.6 (L) 04/23/2021  ? MCV 88.3 04/23/2021  ? PLT 651 (H) 04/23/2021  ?  ?Lab Results  ?Component Value Date  ? CREATININE 0.74 04/23/2021  ? BUN 14 04/23/2021  ? NA 138 04/23/2021  ? K 4.8 04/23/2021  ? CL 107 04/23/2021  ? CO2 26 04/23/2021  ?  ?Lab Results  ?Component Value Date  ? ALT 13 04/23/2021  ? AST 14 (L) 04/23/2021  ? ALKPHOS 59 04/23/2021  ? BILITOT <0.1 (L) 04/23/2021  ?  ? ?Microbiology: ?Recent Results (from the past 240 hour(s))  ?Blood culture (routine x 2)     Status: Abnormal  ? Collection Time: 04/16/21  1:35 PM  ? Specimen: BLOOD  ?Result Value Ref Range Status  ? Specimen Description   Final  ?  BLOOD BLOOD LEFT FOREARM ?Performed at [facility], [address], [place], [place] [address] ?  ? Special Requests   Final  ?  BOTTLES DRAWN AEROBIC AND ANAEROBIC Blood Culture adequate  volume ?Performed at [facility], [address] [place], [place] [address] ?  ? Culture  Setup Time   Final  ?  GRAM POSITIVE COCCI ?ANAEROBIC BOTTLE ONLY ?CRITICAL RESULT CALLED TO, READ BACK BY AND VERIFIED WITH: [facility] 04/17/21 1442 MW ?GRAM STAIN REVIEWED-AGREE WITH RESULT ?  ? Culture (A)  Final  ?  STAPHYLOCOCCUS EPIDERMIDIS ?THE SIGNIFICANCE OF ISOLATING THIS ORGANISM FROM A SINGLE SET OF BLOOD CULTURES WHEN MULTIPLE SETS ARE DRAWN IS UNCERTAIN. PLEASE NOTIFY THE MICROBIOLOGY DEPARTMENT WITHIN ONE WEEK IF SPECIATION AND SENSITIVITIES ARE REQUIRED. ?Performed at [facility], [address], [place], [place] [address] ?  ? Report Status 04/19/2021 FINAL  Final  ?Blood Culture ID Panel (Reflexed)     Status: Abnormal  ? Collection Time: 04/16/21  1:35 PM  ?Result Value Ref Range Status  ? Enterococcus faecalis NOT DETECTED NOT DETECTED Final  ? Enterococcus Faecium NOT DETECTED NOT DETECTED Final  ? Listeria monocytogenes NOT DETECTED NOT DETECTED Final  ? Staphylococcus species DETECTED (A) NOT DETECTED Final  ?  Comment: CRITICAL RESULT CALLED TO, READ BACK BY AND VERIFIED WITH: ?Drusilla Kanner 04/17/21 1143 MW ?  ? Staphylococcus aureus (BCID) NOT DETECTED NOT DETECTED Final  ? Staphylococcus epidermidis DETECTED (A) NOT DETECTED Final  ?  Comment: Methicillin (oxacillin) resistant coagulase negative staphylococcus. Possible blood culture contaminant (unless isolated from more than one blood culture draw or clinical case suggests pathogenicity). No antibiotic treatment is indicated for blood  ?culture contaminants. ?CRITICAL RESULT CALLED TO, READ BACK BY AND VERIFIED WITH: ?Drusilla Kanner 04/17/21 1143 MW ?  ? Staphylococcus lugdunensis NOT DETECTED NOT DETECTED Final  ? Streptococcus species NOT DETECTED NOT DETECTED Final  ? Streptococcus agalactiae NOT DETECTED NOT DETECTED Final  ? Streptococcus pneumoniae NOT DETECTED NOT DETECTED Final  ? Streptococcus pyogenes NOT DETECTED NOT  DETECTED Final  ? A.calcoaceticus-baumannii NOT DETECTED NOT DETECTED Final  ? Bacteroides fragilis NOT DETECTED NOT DETECTED Final  ? Enterobacterales NOT DETECTED NOT DETECTED Final  ? Enterobacter cloacae complex NOT DETECTED NOT DETECTED Final  ? Escherichia coli NOT DETECTED NOT DETECTED Final  ? Klebsiella aerogenes NOT DETECTED NOT DETECTED Final  ? Klebsiella oxytoca NOT DETECTED NOT DETECTED Final  ? Klebsiella pneumoniae NOT DETECTED NOT DETECTED Final  ? Proteus species NOT DETECTED NOT DETECTED Final  ? Salmonella species NOT DETECTED NOT DETECTED Final  ? Serratia marcescens NOT DETECTED  NOT DETECTED Final  ? Haemophilus influenzae NOT DETECTED NOT DETECTED Final  ? Neisseria meningitidis NOT DETECTED NOT DETECTED Final  ? Pseudomonas aeruginosa NOT DETECTED NOT DETECTED Final  ? Stenotrophomonas maltophilia NOT DETECTED NOT DETECTED Final  ? Candida albicans NOT DETECTED NOT DETECTED Final  ? Candida auris NOT DETECTED NOT DETECTED Final  ? Candida glabrata NOT DETECTED NOT DETECTED Final  ? Candida krusei NOT DETECTED NOT DETECTED Final  ? Candida parapsilosis NOT DETECTED NOT DETECTED Final  ? Candida tropicalis NOT DETECTED NOT DETECTED Final  ? Cryptococcus neoformans/gattii NOT DETECTED NOT DETECTED Final  ? Methicillin resistance mecA/C DETECTED (A) NOT DETECTED Final  ?  Comment: CRITICAL RESULT CALLED TO, READ BACK BY AND VERIFIED WITH: ?Drusilla Kanner 04/17/21 1143 MW ?Performed at [facility], [address] [place], [place] [address] ?  ?Blood culture (routine x 2)     Status: None  ? Collection Time: 04/16/21  1:42 PM  ? Specimen: BLOOD  ?Result Value Ref Range Status  ? Specimen Description BLOOD BLOOD RIGHT HAND  Final  ? Special Requests   Final  ?  BOTTLES DRAWN AEROBIC AND ANAEROBIC Blood Culture adequate volume  ? Culture   Final  ?  NO GROWTH 5 DAYS ?Performed at [facility], [address], [place], [place] [address] ?  ? Report Status 04/21/2021 FINAL  Final   ?Resp Panel by RT-PCR (Flu A&B, Covid) Nasopharyngeal Swab     Status: None  ? Collection Time: 04/16/21  2:05 PM  ? Specimen: Nasopharyngeal Swab; Nasopharyngeal(NP) swabs in vial transport medium  ?Re

## 2021-04-24 NOTE — Progress Notes (Signed)
Patient given discharge instructions and stated understanding.  Patient has received his TOC medication and is waiting for his ride home. ?

## 2021-04-25 NOTE — Telephone Encounter (Signed)
Attempted to call pt's grandfather Jeneen Rinks but when the call seemed to be connecting, it then was immediately disconnected. ? ?Routing this encounter to front desk pool to help get pt scheduled for a hospital follow up appt with Dr. Verlee Monte in 4 weeks. ? ?Pt does need chest xray prior! ?

## 2021-05-09 ENCOUNTER — Inpatient Hospital Stay (INDEPENDENT_AMBULATORY_CARE_PROVIDER_SITE_OTHER): Payer: Self-pay | Admitting: Primary Care

## 2021-06-01 LAB — MTB-RIF NAA W AFB CULT, NON-SPUTUM: Acid Fast Culture: NEGATIVE

## 2021-06-02 LAB — ACID FAST CULTURE WITH REFLEXED SENSITIVITIES (MYCOBACTERIA): Acid Fast Culture: NEGATIVE

## 2021-12-07 ENCOUNTER — Emergency Department
Admission: EM | Admit: 2021-12-07 | Discharge: 2021-12-07 | Disposition: A | Discharge status: Home / Self Care | Payer: BLUE CROSS/BLUE SHIELD | Attending: Emergency Medicine | Admitting: Emergency Medicine

## 2021-12-07 ENCOUNTER — Encounter: Payer: Self-pay | Admitting: Emergency Medicine

## 2021-12-07 DIAGNOSIS — T40604A Poisoning by unspecified narcotics, undetermined, initial encounter: Secondary | ICD-10-CM

## 2021-12-07 DIAGNOSIS — T402X1A Poisoning by other opioids, accidental (unintentional), initial encounter: Secondary | ICD-10-CM | POA: Diagnosis present

## 2021-12-07 MED ORDER — NALOXONE HCL 4 MG/0.1ML NA LIQD: NASAL | 0 refills | Status: DC

## 2021-12-07 NOTE — ED Provider Notes (Signed)
   [facility] Provider Note    Event Date/Time   First MD Initiated Contact with Patient 12/07/21 2012     (approximate)   History   Drug Overdose   HPI  Lance Marquez is a 26 y.o. male  who presents to the emergency department today because of concern for overdose. The patient states that he used fentanyl tonight. Was given narcan by EMS. At the time of my exam he denies any complaint.    Physical Exam   Triage Vital Signs: ED Triage Vitals [12/07/21 2010]  Enc Vitals Group     BP (!) 131/101     Pulse Rate (!) 118     Resp 20     Temp 100 F (37.8 C)     Temp Source Oral     SpO2 100 %     Weight 158 lb 11.7 oz (72 kg)     Height 5\' 9"  (1.753 m)     Head Circumference      Peak Flow      Pain Score 0   Most recent vital signs: Vitals:   12/07/21 2010  BP: (!) 131/101  Pulse: (!) 118  Resp: 20  Temp: 100 F (37.8 C)  SpO2: 100%   General: Awake, alert, oriented. CV:  Good peripheral perfusion. Tachycardia Resp:  Normal effort. Lungs clear. Abd:  No distention.    ED Results / Procedures / Treatments   Labs (all labs ordered are listed, but only abnormal results are displayed) Labs Reviewed - No data to display   EKG  None  RADIOLOGY None   PROCEDURES:  Critical Care performed: No  Procedures   MEDICATIONS ORDERED IN ED: Medications - No data to display   IMPRESSION / MDM / [place] / ED COURSE  I reviewed the triage vital signs and the nursing notes.                              Differential diagnosis includes, but is not limited to, accidental overdose, intentional overdose.  Patient's presentation is most consistent with acute presentation with potential threat to life or bodily function.  Patient presents to the emergency department today after fentanyl overdose. Did require narcan by EMS. No complaints at the time of my exam.   Patient was observed in the emergency department for  three hours without any further concern for respiratory depression. Will plan on discharging. Will give patient prescription for narcan.   FINAL CLINICAL IMPRESSION(S) / ED DIAGNOSES   Final diagnoses:  Opiate overdose, undetermined intent, initial encounter [facility])       Note:  This document was prepared using Dragon voice recognition software and may include unintentional dictation errors.    Nance Pear, MD 12/07/21 2312
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
## 2021-12-07 NOTE — Discharge Instructions (Signed)
Please seek medical attention for any high fevers, chest pain, shortness of breath, change in behavior, persistent vomiting, bloody stool or any other new or concerning symptoms.  

## 2022-01-31 ENCOUNTER — Other Ambulatory Visit (HOSPITAL_COMMUNITY)
Admission: EM | Admit: 2022-01-31 | Discharge: 2022-02-04 | Disposition: A | Discharge status: Home / Self Care | Payer: BLUE CROSS/BLUE SHIELD | Attending: Psychiatry | Admitting: Psychiatry

## 2022-01-31 ENCOUNTER — Other Ambulatory Visit: Payer: Self-pay

## 2022-01-31 DIAGNOSIS — F1910 Other psychoactive substance abuse, uncomplicated: Secondary | ICD-10-CM | POA: Diagnosis not present

## 2022-01-31 DIAGNOSIS — F1120 Opioid dependence, uncomplicated: Secondary | ICD-10-CM

## 2022-01-31 DIAGNOSIS — F1994 Other psychoactive substance use, unspecified with psychoactive substance-induced mood disorder: Secondary | ICD-10-CM | POA: Diagnosis present

## 2022-01-31 DIAGNOSIS — Z1152 Encounter for screening for COVID-19: Secondary | ICD-10-CM | POA: Insufficient documentation

## 2022-01-31 DIAGNOSIS — F1124 Opioid dependence with opioid-induced mood disorder: Secondary | ICD-10-CM | POA: Diagnosis not present

## 2022-01-31 LAB — HIV ANTIBODY (ROUTINE TESTING W REFLEX): HIV Screen 4th Generation wRfx: NONREACTIVE

## 2022-01-31 LAB — COMPREHENSIVE METABOLIC PANEL
ALT: 13 U/L (ref 0–44)
AST: 20 U/L (ref 15–41)
Albumin: 3.7 g/dL (ref 3.5–5.0)
Alkaline Phosphatase: 74 U/L (ref 38–126)
Anion gap: 10 (ref 5–15)
BUN: 15 mg/dL (ref 6–20)
CO2: 22 mmol/L (ref 22–32)
Calcium: 9.2 mg/dL (ref 8.9–10.3)
Chloride: 104 mmol/L (ref 98–111)
Creatinine, Ser: 0.9 mg/dL (ref 0.61–1.24)
GFR, Estimated: >60 mL/min (ref >60)
Glucose, Bld: 95 mg/dL (ref 70–99)
Potassium: 4.2 mmol/L (ref 3.5–5.1)
Sodium: 136 mmol/L (ref 135–145)
Total Bilirubin: 0.6 mg/dL (ref 0.3–1.2)
Total Protein: 8 g/dL (ref 6.5–8.1)

## 2022-01-31 LAB — ETHANOL: Alcohol, Ethyl (B): <10 mg/dL (ref <10)

## 2022-01-31 LAB — POCT URINE DRUG SCREEN - MANUAL ENTRY (I-SCREEN)
POC Amphetamine UR: POSITIVE — AB
POC Buprenorphine (BUP): NOT DETECTED
POC Cocaine UR: POSITIVE — AB
POC Marijuana UR: POSITIVE — AB
POC Methadone UR: NOT DETECTED
POC Methamphetamine UR: POSITIVE — AB
POC Morphine: POSITIVE — AB
POC Oxazepam (BZO): NOT DETECTED
POC Oxycodone UR: NOT DETECTED
POC Secobarbital (BAR): NOT DETECTED

## 2022-01-31 LAB — LIPID PANEL
Cholesterol: 113 mg/dL (ref 0–200)
HDL: 21 mg/dL — ABNORMAL LOW (ref >40)
LDL Cholesterol: 70 mg/dL (ref 0–99)
Total CHOL/HDL Ratio: 5.4 RATIO
Triglycerides: 109 mg/dL (ref <150)
VLDL: 22 mg/dL (ref 0–40)

## 2022-01-31 LAB — TSH: TSH: 2.984 u[IU]/mL (ref 0.350–4.500)

## 2022-01-31 LAB — RESP PANEL BY RT-PCR (RSV, FLU A&B, COVID)  RVPGX2
Influenza A by PCR: NEGATIVE
Influenza B by PCR: NEGATIVE
Resp Syncytial Virus by PCR: NEGATIVE
SARS Coronavirus 2 by RT PCR: NEGATIVE

## 2022-01-31 LAB — POC SARS CORONAVIRUS 2 AG: SARSCOV2ONAVIRUS 2 AG: NEGATIVE

## 2022-01-31 MED ORDER — MAGNESIUM HYDROXIDE 400 MG/5ML PO SUSP: 30.0000 mL | Freq: Every day | ORAL | Status: DC | PRN

## 2022-01-31 MED ORDER — CLONIDINE HCL 0.1 MG PO TABS
0.1000 mg | ORAL_TABLET | ORAL | Status: DC
  Administered 2022-02-03 – 2022-02-04 (×3): 0.1 mg via ORAL
  Filled 2022-01-31 (×3): qty 1

## 2022-01-31 MED ORDER — ALUM & MAG HYDROXIDE-SIMETH 200-200-20 MG/5ML PO SUSP: 30.0000 mL | ORAL | Status: DC | PRN

## 2022-01-31 MED ORDER — NAPROXEN 500 MG PO TABS
500.0000 mg | ORAL_TABLET | Freq: Two times a day (BID) | ORAL | Status: DC | PRN
  Administered 2022-02-02 – 2022-02-03 (×2): 500 mg via ORAL
  Filled 2022-01-31 (×2): qty 1

## 2022-01-31 MED ORDER — DICYCLOMINE HCL 20 MG PO TABS: 20.0000 mg | ORAL_TABLET | Freq: Four times a day (QID) | ORAL | Status: DC | PRN

## 2022-01-31 MED ORDER — CLONIDINE HCL 0.1 MG PO TABS: 0.1000 mg | ORAL_TABLET | Freq: Every day | ORAL | Status: DC

## 2022-01-31 MED ORDER — HYDROXYZINE HCL 25 MG PO TABS
25.0000 mg | ORAL_TABLET | Freq: Three times a day (TID) | ORAL | Status: DC | PRN
  Administered 2022-01-31 – 2022-02-03 (×3): 25 mg via ORAL
  Filled 2022-01-31 (×3): qty 1

## 2022-01-31 MED ORDER — METHOCARBAMOL 500 MG PO TABS
500.0000 mg | ORAL_TABLET | Freq: Three times a day (TID) | ORAL | Status: DC | PRN
  Administered 2022-02-02 – 2022-02-03 (×2): 500 mg via ORAL
  Filled 2022-01-31 (×2): qty 1

## 2022-01-31 MED ORDER — ACETAMINOPHEN 325 MG PO TABS: 650.0000 mg | ORAL_TABLET | Freq: Four times a day (QID) | ORAL | Status: DC | PRN

## 2022-01-31 MED ORDER — LOPERAMIDE HCL 2 MG PO CAPS: 2.0000 mg | ORAL_CAPSULE | ORAL | Status: DC | PRN

## 2022-01-31 MED ORDER — CLONIDINE HCL 0.1 MG PO TABS
0.1000 mg | ORAL_TABLET | Freq: Four times a day (QID) | ORAL | Status: AC
  Administered 2022-01-31 – 2022-02-02 (×8): 0.1 mg via ORAL
  Filled 2022-01-31 (×8): qty 1

## 2022-01-31 MED ORDER — TRAZODONE HCL 50 MG PO TABS
50.0000 mg | ORAL_TABLET | Freq: Every evening | ORAL | Status: DC | PRN
  Administered 2022-01-31 – 2022-02-03 (×4): 50 mg via ORAL
  Filled 2022-01-31 (×4): qty 1

## 2022-01-31 MED ORDER — ONDANSETRON 4 MG PO TBDP: 4.0000 mg | ORAL_TABLET | Freq: Four times a day (QID) | ORAL | Status: DC | PRN

## 2022-01-31 NOTE — ED Notes (Signed)
Patient has been brought on the unit and familiarized with the unit. The patient has been given a sandwich and chips

## 2022-01-31 NOTE — ED Notes (Signed)
Pt admitted to [facility] requesting detox from fentanyl and requesting substance abuse treatment . Patient was cooperative during the admission assessment. Skin assessment complete. Belongings in the locker. Patient oriented to unit and unit rules. Meal and drinks offered to patient, pt refused.  Patient verbalized agreement to treatment plans. Patient verbally contracts for safety while hospitalized. Will monitor for safety.

## 2022-01-31 NOTE — ED Notes (Signed)
Pt A&O x 4, no distress noted, calm  & cooperative,  watching TV at present, denies SI, HI or AVH.  Comfort measures given.  Monitoring for safety.
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
## 2022-01-31 NOTE — ED Notes (Signed)
Patient arrived on unit, Patient is calm and cooperative, patient is giving a meal, Patient safe on unit with continued monitoring
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
## 2022-01-31 NOTE — ED Notes (Signed)
Pt is alert and oriented x4. Pt is here for substance abuse treatment. Pt denies SI/HI/AVH. Pt verbally contracts for safety. He has an ankle monitor on for parole. He states he has to charge it around every 30 hours. Pt is going to [facility] when covid is negative.

## 2022-02-01 ENCOUNTER — Encounter (HOSPITAL_COMMUNITY): Payer: Self-pay

## 2022-02-01 DIAGNOSIS — F1994 Other psychoactive substance use, unspecified with psychoactive substance-induced mood disorder: Secondary | ICD-10-CM | POA: Diagnosis not present

## 2022-02-01 MED ORDER — SERTRALINE HCL 50 MG PO TABS
50.0000 mg | ORAL_TABLET | Freq: Every day | ORAL | Status: DC
  Administered 2022-02-02 – 2022-02-04 (×3): 50 mg via ORAL
  Filled 2022-02-01 (×3): qty 1

## 2022-02-01 MED ORDER — SERTRALINE HCL 25 MG PO TABS
25.0000 mg | ORAL_TABLET | Freq: Once | ORAL | Status: AC
  Administered 2022-02-01: 25 mg via ORAL
  Filled 2022-02-01: qty 1

## 2022-02-01 NOTE — BH IP Treatment Plan (Signed)
Interdisciplinary Treatment and Diagnostic Plan Update  02/01/2022 Time of Session: 9:50AM Lance Marquez MRN: [id number]  Diagnosis:  Final diagnoses:  Substance induced mood disorder (HCC)  Uncomplicated opioid dependence (HCC)  Substance abuse (HCC)     Current Medications:  Current Facility-Administered Medications  Medication Dose Route Frequency Provider Last Rate Last Admin   acetaminophen (TYLENOL) tablet 650 mg  650 mg Oral Q6H PRN White, Patrice L, NP       alum & mag hydroxide-simeth (MAALOX/MYLANTA) 200-200-20 MG/5ML suspension 30 mL  30 mL Oral Q4H PRN White, Patrice L, NP       cloNIDine (CATAPRES) tablet 0.1 mg  0.1 mg Oral QID White, Patrice L, NP   0.1 mg at 02/01/22 0915   Followed by   Melene Muller ON 02/03/2022] cloNIDine (CATAPRES) tablet 0.1 mg  0.1 mg Oral BH-qamhs White, Patrice L, NP       Followed by   Melene Muller ON 02/05/2022] cloNIDine (CATAPRES) tablet 0.1 mg  0.1 mg Oral QAC breakfast White, Patrice L, NP       dicyclomine (BENTYL) tablet 20 mg  20 mg Oral Q6H PRN White, Patrice L, NP       hydrOXYzine (ATARAX) tablet 25 mg  25 mg Oral TID PRN White, Patrice L, NP   25 mg at 01/31/22 2026   loperamide (IMODIUM) capsule 2-4 mg  2-4 mg Oral PRN White, Patrice L, NP       magnesium hydroxide (MILK OF MAGNESIA) suspension 30 mL  30 mL Oral Daily PRN White, Patrice L, NP       methocarbamol (ROBAXIN) tablet 500 mg  500 mg Oral Q8H PRN White, Patrice L, NP       naproxen (NAPROSYN) tablet 500 mg  500 mg Oral BID PRN White, Patrice L, NP       ondansetron (ZOFRAN-ODT) disintegrating tablet 4 mg  4 mg Oral Q6H PRN White, Patrice L, NP       traZODone (DESYREL) tablet 50 mg  50 mg Oral QHS PRN White, Patrice L, NP   50 mg at 01/31/22 2026   No current outpatient medications on file.   PTA Medications: Prior to Admission medications   Not on File    Patient Stressors: Substance abuse    Patient Strengths: Ability for insight  Average or above average  intelligence  Capable of independent living  General fund of knowledge   Treatment Modalities: Medication Management, Group therapy, Case management,  1 to 1 session with clinician, Psychoeducation, Recreational therapy.   Physician Treatment Plan for Primary and Secondary Diagnosis:  Final diagnoses:  Substance induced mood disorder (HCC)  Uncomplicated opioid dependence (HCC)  Substance abuse (HCC)   Long Term Goal(s): Improvement in symptoms so as ready for discharge  Short Term Goals: Patient will attend at least of 50% of the groups daily. Pt will complete the PHQ9 on admission, day 3 and discharge. Patient will take medications as prescribed daily.  Medication Management: Evaluate patient's response, side effects, and tolerance of medication regimen.  Therapeutic Interventions: 1 to 1 sessions, Unit Group sessions and Medication administration.  Evaluation of Outcomes: Progressing  LCSW Treatment Plan for Primary Diagnosis:  Final diagnoses:  Substance induced mood disorder (HCC)  Uncomplicated opioid dependence (HCC)  Substance abuse (HCC)    Long Term Goal(s): Safe transition to appropriate next level of care at discharge.  Short Term Goals: Facilitate acceptance of mental health diagnosis and concerns through verbal commitment to aftercare plan and appointments at discharge.,  Patient will identify one social support prior to discharge to aid in patient's recovery., Patient will attend AA/NA groups as scheduled., Identify minimum of 2 triggers associated with mental health/substance abuse issues with treatment team members., and Increase skills for wellness and recovery by attending 50% of scheduled groups.  Therapeutic Interventions: Assess for all discharge needs, 1 to 1 time with Child psychotherapist, Explore available resources and support systems, Assess for adequacy in community support network, Educate family and significant other(s) on suicide prevention, Complete  Psychosocial Assessment, Interpersonal group therapy.  Evaluation of Outcomes: Progressing   Progress in Treatment: Attending groups: No. Participating in groups: No. Taking medication as prescribed: Yes. Toleration medication: Yes. Family/Significant other contact made: No, will contact:  Patient provided permission for LCSW to follow up with his father to gain collateral: Lance Marquez [phone] Patient understands diagnosis: Yes. Discussing patient identified problems/goals with staff: Yes. Medical problems stabilized or resolved: Yes. Denies suicidal/homicidal ideation: Yes. Issues/concerns per patient self-inventory: Yes. Other: substance use and medication management  New problem(s) identified: No, Describe:  other than reported on admission.   New Short Term/Long Term Goal(s): Safe transition to appropriate next level of care at discharge, Engage patient in therapeutic group addressing interpersonal concerns. Engage patient in aftercare planning with referrals and resources, Increase ability to appropriately verbalize feelings, Facilitate acceptance of mental health diagnosis and concerns and Identify triggers associated with mental health/substance abuse issues.   Patient Goals:  Patient reports an interest in medication management only.   Discharge Plan or Barriers: LCSW will continue to follow for needs. Resources for [facility] in [place] will be added to AVS.   Reason for Continuation of Hospitalization: Medication stabilization Withdrawal symptoms  Estimated Length of Stay: 3-5 days  Last 3 [place] Suicide Severity Risk Score: Flowsheet Row ED from 01/31/2022 in [facility] ED from 12/07/2021 in [facility] Admission (Discharged) from 04/18/2021 in [facility]  C-SSRS RISK CATEGORY No Risk No Risk No Risk       Last PHQ 2/9 Scores:    01/31/2022    6:44 PM  Depression screen  PHQ 2/9  Decreased Interest 0  Down, Depressed, Hopeless 3  PHQ - 2 Score 3  Altered sleeping 3  Tired, decreased energy 3  Change in appetite 3  Feeling bad or failure about yourself  3  Trouble concentrating 0  Moving slowly or fidgety/restless 0  Suicidal thoughts 0  PHQ-9 Score 15  Difficult doing work/chores Somewhat difficult    Scribe for Treatment Team: Lenny Pastel 02/01/2022 11:12 AM

## 2022-02-01 NOTE — ED Notes (Signed)
Gatorade, pudding, jello and goldfish given for snacks

## 2022-02-01 NOTE — ED Notes (Signed)
Pt asleep in bed. Respirations even and unlabored. Monitoring for safety. 

## 2022-02-01 NOTE — Discharge Instructions (Signed)
[facility] Address: [address] [place], [place] [address] Phone: [phone] Previous provider: Please follow up for medication management.   [facility] [address] [place], [place], [address] [phone] phone [phone] fax NOTE: Does have Substance Abuse-Intensive Outpatient Program [facility]) as well as transitional housing if eligible.  [facility], [place] [address] [phone] Male and Male facility; [phone]; Residential Program; no additional fees; does not matter about insurance or uninsured; Will need to complete a phone screening; be able to work 40 hours a week; no prior sex offenders; no car for the first year of the program; 4 church services a week; receiving any income: may have to put 30% towards stay;  [facility]: Substance Use Transitional Living [phone]  [place], [place] [address] [phone]  [facility] in [employer], [place]: [place] Desk Staff: Michel Bickers [phone]; They have a Men's Regenerations Program; The programs are 6-29month. There is an initial $300 fee however, they are willing to work with patients regarding that.  [employer]  12 STEP PROGRAMS:  Alcoholics Anonymous of [place] hReportZoo.com.cy Narcotics Anonymous of [place] hGreenScrapbooking.dk Al-Anon of [employer] N[place]ww.[place]alanon.org/find-meetings.html  Nar-Anon https://nar-anon.org/find-a-meetin  Follow-up recommendations:   Activity:  Normal, as tolerated Diet:  Per PCP recommendation  Patient is instructed prior to discharge to: Take all medications as prescribed by mental healthcare provider. Report any adverse effects and/or reactions from the medicines to outpatient provider promptly. Patient has been instructed & cautioned: To not engage in alcohol and  or illegal drug use while on prescription medicines.  In the event of worsening symptoms, patient is instructed to call the crisis hotline at [phone], 911 and or go to the nearest ED for appropriate evaluation and treatment of symptoms. To follow-up with primary care provider for your other medical issues, concerns and or health care needs.   Naloxone (Narcan) can help reverse an overdose when given to the victim quickly.  [facility] free naloxone kits and instructions/training on its use.  Add naloxone to your first aid kit and you can help save a life.   Pick up your free kit at the following locations.   [place]:  [place] [phone] [facility] [phone] [facility] [address]: [place] [phone] [facility] [phone]

## 2022-02-01 NOTE — ED Provider Notes (Signed)
Behavioral Health Progress Note  Date and Time: 02/01/2022 7:19 AM Name: Lance Marquez MRN:  [id number]  Subjective:  Patient seen lying in bed with social worker. Reports feeling depressed, does endorse prior trial of wellbutrin when he was 17 but otherwise has not tried any other antidepressants. Reports depressed mood, impulsivity, decreased energy that lead to substance use. Reports passive SI previously but no SI in the last month. Denies HI. Reports anger issues with being physical with others. He denies AVH. Reports stressors of losing 2 brothers in a car wreck, a dad who was an abusive alcoholic, and mom that is absent. Reports substance use to include daily fentanyl use 3 points-1g for the past 8 years, on and off methamphetamine and coke use. Discussed starting zoloft and patient amenable. Patient reports tried rehab before but no help. Reports tried intensive outpatient at [facility] but reports difficulty with transportation getting there. He lives in [place] with his dad and grandpa. His plan is to stay in [place] with his dad and grandpa before going to an [facility] in [place] where his fiancee is. His goal is to clean up and get a job.   Diagnosis:  Final diagnoses:  Substance induced mood disorder (HCC)  Uncomplicated opioid dependence ([place])  Substance abuse ([place])    Total Time spent with patient: 30 minutes  Past Psychiatric History: History of suicidal behavior, drug abuse and dependency, opioid withdrawal and substance-induced mood disorder. A reported history of PTSD, MDD and bipolar.    Past Medical History:  Past Medical History:  Diagnosis Date   Asthma    C. difficile diarrhea    Diarrhea    Drug abuse and dependence ([place])    Parasites in stool    Suicidal behavior     Past Surgical History:  Procedure Laterality Date   KNEE ARTHROSCOPY Right 03/22/2021   Procedure: ARTHROSCOPY KNEE;  Surgeon: Leim Fabry, MD;  Location: [facility] ORS;  Service:  Orthopedics;  Laterality: Right;   Family History: No family history on file. Family Psychiatric  History: Unknown Social History:  Social History   Substance and Sexual Activity  Alcohol Use Yes   Comment: occ     Social History   Substance and Sexual Activity  Drug Use Yes   Types: IV, Methamphetamines   Comment: heroin, meth    Social History   Socioeconomic History   Marital status: Single    Spouse name: Not on file   Number of children: Not on file   Years of education: Not on file   Highest education level: Not on file  Occupational History   Not on file  Tobacco Use   Smoking status: Every Day    Types: Cigarettes   Smokeless tobacco: Never  Substance and Sexual Activity   Alcohol use: Yes    Comment: occ   Drug use: Yes    Types: IV, Methamphetamines    Comment: heroin, meth   Sexual activity: Never  Other Topics Concern   Not on file  Social History Narrative   Not on file   Social Determinants of Health   Financial Resource Strain: Not on file  Food Insecurity: Not on file  Transportation Needs: Not on file  Physical Activity: Not on file  Stress: Not on file  Social Connections: Not on file   SDOH:  SDOH Screenings   Depression (PHQ2-9): High Risk (01/31/2022)  Tobacco Use: High Risk (12/07/2021)   Additional Social History:    Pain Medications: See  MAR Prescriptions: See MAR Over the Counter: See MAR History of alcohol / drug use?: Yes Negative Consequences of Use: Legal, Work / School Withdrawal Symptoms: None Name of Substance 1: Fent 1 - Age of First Use: 19 1 - Amount (size/oz): 3-4 SHOTS 1 - Frequency: daily 1 - Duration: ongoing 1 - Last Use / Amount: 4-5 hours ago 1 - Method of Aquiring: unknown 1- Route of Use: IV Name of Substance 2: Meth 2 - Age of First Use: 23 2 - Amount (size/oz): 3-4 hots 2 - Frequency: couple times a month 2 - Duration: ongoing 2 - Last Use / Amount: yesterday 2 - Method of Aquiring:  unknown 2 - Route of Substance Use: IV Name of Substance 3: Cocaine 3 - Age of First Use: 21 3 - Amount (size/oz): unknown 3 - Frequency: couple times a month 3 - Duration: ongoing 3 - Last Use / Amount: yesterday 3 - Method of Aquiring: unknown 3 - Route of Substance Use: IV             Sleep: Fair  Appetite:  Fair  Current Medications:  Current Facility-Administered Medications  Medication Dose Route Frequency Provider Last Rate Last Admin   acetaminophen (TYLENOL) tablet 650 mg  650 mg Oral Q6H PRN White, Patrice L, NP       alum & mag hydroxide-simeth (MAALOX/MYLANTA) 200-200-20 MG/5ML suspension 30 mL  30 mL Oral Q4H PRN White, Patrice L, NP       cloNIDine (CATAPRES) tablet 0.1 mg  0.1 mg Oral QID White, Patrice L, NP   0.1 mg at 01/31/22 2027   Followed by   Derrill Memo ON 02/03/2022] cloNIDine (CATAPRES) tablet 0.1 mg  0.1 mg Oral BH-qamhs White, Patrice L, NP       Followed by   Derrill Memo ON 02/05/2022] cloNIDine (CATAPRES) tablet 0.1 mg  0.1 mg Oral QAC breakfast White, Patrice L, NP       dicyclomine (BENTYL) tablet 20 mg  20 mg Oral Q6H PRN White, Patrice L, NP       hydrOXYzine (ATARAX) tablet 25 mg  25 mg Oral TID PRN White, Patrice L, NP   25 mg at 01/31/22 2026   loperamide (IMODIUM) capsule 2-4 mg  2-4 mg Oral PRN White, Patrice L, NP       magnesium hydroxide (MILK OF MAGNESIA) suspension 30 mL  30 mL Oral Daily PRN White, Patrice L, NP       methocarbamol (ROBAXIN) tablet 500 mg  500 mg Oral Q8H PRN White, Patrice L, NP       naproxen (NAPROSYN) tablet 500 mg  500 mg Oral BID PRN White, Patrice L, NP       ondansetron (ZOFRAN-ODT) disintegrating tablet 4 mg  4 mg Oral Q6H PRN White, Patrice L, NP       traZODone (DESYREL) tablet 50 mg  50 mg Oral QHS PRN White, Patrice L, NP   50 mg at 01/31/22 2026   Current Outpatient Medications  Medication Sig Dispense Refill   acetaminophen (TYLENOL) 325 MG tablet Take 2 tablets (650 mg total) by mouth every 6 (six) hours as  needed for mild pain, headache or fever. (Patient not taking: Reported on 04/20/2021)     buprenorphine-naloxone (SUBOXONE) 8-2 mg SUBL SL tablet Place 1 tablet under the tongue 2 (two) times daily. (Patient not taking: Reported on 04/20/2021) 30 tablet    enoxaparin (LOVENOX) 40 MG/0.4ML injection Inject 0.4 mLs (40 mg total) into the skin daily. (Patient  not taking: Reported on 04/20/2021) 0 mL    ibuprofen (ADVIL) 600 MG tablet Take 1 tablet (600 mg total) by mouth every 8 (eight) hours as needed for mild pain, moderate pain or fever. 30 tablet 2   lidocaine (LIDODERM) 5 % Place 1 patch onto the skin daily. Remove & Discard patch within 12 hours or as directed by MD (Patient not taking: Reported on 04/20/2021) 30 patch 0   naloxone (NARCAN) nasal spray 4 mg/0.1 mL For opioid overdose 1 each 0   polyethylene glycol powder (GLYCOLAX/MIRALAX) 17 GM/SCOOP powder Take 17 g by mouth daily as needed for moderate constipation or mild constipation. 238 g 0    Labs  Lab Results:  Admission on 01/31/2022  Component Date Value Ref Range Status   SARS Coronavirus 2 by RT PCR 01/31/2022 NEGATIVE  NEGATIVE Final   Comment: (NOTE) SARS-CoV-2 target nucleic acids are NOT DETECTED.  The SARS-CoV-2 RNA is generally detectable in upper respiratory specimens during the acute phase of infection. The lowest concentration of SARS-CoV-2 viral copies this assay can detect is 138 copies/mL. A negative result does not preclude SARS-Cov-2 infection and should not be used as the sole basis for treatment or other patient management decisions. A negative result may occur with  improper specimen collection/handling, submission of specimen other than nasopharyngeal swab, presence of viral mutation(s) within the areas targeted by this assay, and inadequate number of viral copies(<138 copies/mL). A negative result must be combined with clinical observations, patient history, and epidemiological information. The expected  result is Negative.  Fact Sheet for Patients:  BloggerCourse.com  Fact Sheet for Healthcare Providers:  SeriousBroker.it  This test is no                          t yet approved or cleared by the [place] FDA and  has been authorized for detection and/or diagnosis of SARS-CoV-2 by FDA under an Emergency Use Authorization (EUA). This EUA will remain  in effect (meaning this test can be used) for the duration of the COVID-19 declaration under Section 564(b)(1) of the Act, 21 U.S.C.section 360bbb-3(b)(1), unless the authorization is terminated  or revoked sooner.       Influenza A by PCR 01/31/2022 NEGATIVE  NEGATIVE Final   Influenza B by PCR 01/31/2022 NEGATIVE  NEGATIVE Final   Comment: (NOTE) The Xpert Xpress SARS-CoV-2/FLU/RSV plus assay is intended as an aid in the diagnosis of influenza from Nasopharyngeal swab specimens and should not be used as a sole basis for treatment. Nasal washings and aspirates are unacceptable for Xpert Xpress SARS-CoV-2/FLU/RSV testing.  Fact Sheet for Patients: BloggerCourse.com  Fact Sheet for Healthcare Providers: SeriousBroker.it  This test is not yet approved or cleared by the [place] FDA and has been authorized for detection and/or diagnosis of SARS-CoV-2 by FDA under an Emergency Use Authorization (EUA). This EUA will remain in effect (meaning this test can be used) for the duration of the COVID-19 declaration under Section 564(b)(1) of the Act, 21 U.S.C. section 360bbb-3(b)(1), unless the authorization is terminated or revoked.     Resp Syncytial Virus by PCR 01/31/2022 NEGATIVE  NEGATIVE Final   Comment: (NOTE) Fact Sheet for Patients: BloggerCourse.com  Fact Sheet for Healthcare Providers: SeriousBroker.it  This test is not yet approved or cleared by the [place]  FDA and has been authorized for detection and/or diagnosis of SARS-CoV-2 by FDA under an Emergency Use Authorization (EUA). This EUA will remain in effect (  meaning this test can be used) for the duration of the COVID-19 declaration under Section 564(b)(1) of the Act, 21 U.S.C. section 360bbb-3(b)(1), unless the authorization is terminated or revoked.  Performed at [facility], [place] [address], [place], [place] [address]    Sodium 01/31/2022 136  135 - 145 mmol/L Final   Potassium 01/31/2022 4.2  3.5 - 5.1 mmol/L Final   Chloride 01/31/2022 104  98 - 111 mmol/L Final   CO2 01/31/2022 22  22 - 32 mmol/L Final   Glucose, Bld 01/31/2022 95  70 - 99 mg/dL Final   Glucose reference range applies only to samples taken after fasting for at least 8 hours.   BUN 01/31/2022 15  6 - 20 mg/dL Final   Creatinine, Ser 01/31/2022 0.90  0.61 - 1.24 mg/dL Final   Calcium 01/31/2022 9.2  8.9 - 10.3 mg/dL Final   Total Protein 01/31/2022 8.0  6.5 - 8.1 g/dL Final   Albumin 01/31/2022 3.7  3.5 - 5.0 g/dL Final   AST 01/31/2022 20  15 - 41 U/L Final   ALT 01/31/2022 13  0 - 44 U/L Final   Alkaline Phosphatase 01/31/2022 74  38 - 126 U/L Final   Total Bilirubin 01/31/2022 0.6  0.3 - 1.2 mg/dL Final   GFR, Estimated 01/31/2022 >60  >60 mL/min Final   Comment: (NOTE) Calculated using the CKD-EPI Creatinine Equation (2021)    Anion gap 01/31/2022 10  5 - 15 Final   Performed at [facility] [address] [place], [place] [address]   Alcohol, Ethyl (B) 01/31/2022 <10  <10 mg/dL Final   Comment: (NOTE) Lowest detectable limit for serum alcohol is 10 mg/dL.  For medical purposes only. Performed at [facility], [place] [address] [place], [place] [address]    POC Amphetamine UR 01/31/2022 Positive (A)  NONE DETECTED (Cut Off Level 1000 ng/mL) Final   POC Secobarbital (BAR) 01/31/2022 None Detected  NONE DETECTED (Cut Off Level 300 ng/mL) Final   POC Buprenorphine (BUP) 01/31/2022 None  Detected  NONE DETECTED (Cut Off Level 10 ng/mL) Final   POC Oxazepam (BZO) 01/31/2022 None Detected  NONE DETECTED (Cut Off Level 300 ng/mL) Final   POC Cocaine UR 01/31/2022 Positive (A)  NONE DETECTED (Cut Off Level 300 ng/mL) Final   POC Methamphetamine UR 01/31/2022 Positive (A)  NONE DETECTED (Cut Off Level 1000 ng/mL) Final   POC Morphine 01/31/2022 Positive (A)  NONE DETECTED (Cut Off Level 300 ng/mL) Final   POC Methadone UR 01/31/2022 None Detected  NONE DETECTED (Cut Off Level 300 ng/mL) Final   POC Oxycodone UR 01/31/2022 None Detected  NONE DETECTED (Cut Off Level 100 ng/mL) Final   POC Marijuana UR 01/31/2022 Positive (A)  NONE DETECTED (Cut Off Level 50 ng/mL) Final   HIV Screen 4th Generation wRfx 01/31/2022 Non Reactive  Non Reactive Final   Performed at [facility], [place] [address], [place], [place] [address]   SARSCOV2ONAVIRUS 2 AG 01/31/2022 NEGATIVE  NEGATIVE Final   Comment: (NOTE) SARS-CoV-2 antigen NOT DETECTED.   Negative results are presumptive.  Negative results do not preclude SARS-CoV-2 infection and should not be used as the sole basis for treatment or other patient management decisions, including infection  control decisions, particularly in the presence of clinical signs and  symptoms consistent with COVID-19, or in those who have been in contact with the virus.  Negative results must be combined with clinical observations, patient history, and  epidemiological information. The expected result is Negative.  Fact Sheet for Patients: HandmadeRecipes.com.cy  Fact Sheet for Healthcare Providers: FuneralLife.at  This test is not yet approved or cleared by the [place] FDA and  has been authorized for detection and/or diagnosis of SARS-CoV-2 by FDA under an Emergency Use Authorization (EUA).  This EUA will remain in effect (meaning this test can be used) for the duration of  the COV                           ID-19 declaration under Section 564(b)(1) of the Act, 21 U.S.C. section 360bbb-3(b)(1), unless the authorization is terminated or revoked sooner.     Cholesterol 01/31/2022 113  0 - 200 mg/dL Final   Triglycerides 01/31/2022 109  <150 mg/dL Final   HDL 01/31/2022 21 (L)  >40 mg/dL Final   Total CHOL/HDL Ratio 01/31/2022 5.4  RATIO Final   VLDL 01/31/2022 22  0 - 40 mg/dL Final   LDL Cholesterol 01/31/2022 70  0 - 99 mg/dL Final   Comment:        Total Cholesterol/HDL:CHD Risk Coronary Heart Disease Risk Table                     Men   Women  1/2 Average Risk   3.4   3.3  Average Risk       5.0   4.4  2 X Average Risk   9.6   7.1  3 X Average Risk  23.4   11.0        Use the calculated Patient Ratio above and the CHD Risk Table to determine the patient's CHD Risk.        ATP III CLASSIFICATION (LDL):  <100     mg/dL   Optimal  100-129  mg/dL   Near or Above                    Optimal  130-159  mg/dL   Borderline  160-189  mg/dL   High  >190     mg/dL   Very High Performed at [facility] [address], [place], [place] [address]    TSH 01/31/2022 2.984  0.350 - 4.500 uIU/mL Final   Comment: Performed by a 3rd Generation assay with a functional sensitivity of <=0.01 uIU/mL. Performed at [facility], [place] [address] [place],  [address]     Blood Alcohol level:  Lab Results  Component Value Date   ETH <10 01/31/2022   ETH <10 [id number]    Metabolic Disorder Labs: No results found for: "HGBA1C", "MPG" No results found for: "PROLACTIN" Lab Results  Component Value Date   CHOL 113 01/31/2022   TRIG 109 01/31/2022   HDL 21 (L) 01/31/2022   CHOLHDL 5.4 01/31/2022   VLDL 22 01/31/2022   LDLCALC 70 01/31/2022    Therapeutic Lab Levels: No results found for: "LITHIUM" No results found for: "VALPROATE" No results found for: "CBMZ"  Physical Findings   PHQ2-9    Flowsheet Row ED from 01/31/2022 in [facility]  PHQ-2 Total Score 3  PHQ-9 Total Score [facility] 12[facility]07/2021 in [facility] (Discharged) from 04/18/2021 in [facility]  C-SSRS RISK CATEGORY No Risk No Risk No Risk  Musculoskeletal  Strength & Muscle Tone: within normal limits Gait & Station:  did not assess Patient leans: N/A  Psychiatric Specialty Exam  Presentation  General Appearance:  Appropriate for Environment  Eye Contact: Fair  Speech: Clear and Coherent  Speech Volume: Normal  Mood and Affect  Mood: Depressed  Affect: Congruent   Thought Process  Thought Processes: Coherent  Descriptions of Associations:Intact  Orientation:Full (Time, Place and Person)  Thought Content:Logical  Diagnosis of Schizophrenia or Schizoaffective disorder in past: No    Hallucinations:Hallucinations: None  Ideas of Reference:None  Suicidal Thoughts:Suicidal Thoughts: No  Homicidal Thoughts:Homicidal Thoughts: No   Sensorium  Memory: Immediate Fair; Recent Fair; Remote Fair  Judgment: Intact  Insight: Present   Executive Functions  Concentration: Fair  Attention Span: Fair  Recall: [employer] of Knowledge: Fair  Language: Fair   Psychomotor Activity  Psychomotor Activity: Psychomotor Activity: Normal   Assets  Assets: Communication Skills; Desire for Improvement; Financial Resources/Insurance   Sleep  Sleep: Sleep: Poor Number of Hours of Sleep: 3   Nutritional Assessment (For OBS and [facility]BC admissions only) Has the patient had a weight loss or gain of 10 pounds or more in the last 3 months?: No Does the patient have dental problems?: No Does the patient have eating habits or behaviors that may be indicators of an eating disorder including binging or inducing vomiting?: No Has the patient recently lost weight  without trying?: 0 Has the patient been eating poorly because of a decreased appetite?: 1 Malnutrition Screening Tool Score: 1    Physical Exam  Constitutional:      Appearance: the patient is not toxic-appearing. Tattoos visible.  Pulmonary:     Effort: Pulmonary effort is normal.  Neurological:     General: No focal deficit present.     Mental Status: the patient is alert and answering questions appropriately.   Review of Systems  Respiratory:  Negative for shortness of breath.   Cardiovascular:  Negative for chest pain.  Gastrointestinal:  Negative for abdominal pain, constipation, diarrhea, nausea and vomiting.  Neurological:  Negative for headaches.   Blood pressure 103/63, pulse 70, temperature 98 F (36.7 C), temperature source Oral, resp. rate 18, height 5\' 9"  (1.753 m), weight 165 lb (74.8 kg), SpO2 100 %. Body mass index is 24.37 kg/m.  Treatment Plan Summary: Daily contact with patient to assess and evaluate symptoms and progress in treatment  Patient undergoing detox from fentanyl, ice, and coke with prior rehab 7 months ago in [place]. Fentanyl use is most regular and recent, reports daily use. UDS+amphetamine, cocaine, meth, morphine, MJ. Reported psychiatric history of depression, PTSD, and bipolar. Prior medication trial includes wellbutrin when he was 26 years old. Reports symptoms of depression, was amenable to starting on zoloft. Will start on 25mg  and uptitrate to address his depression. He is interested in outpatient therapy and medication management, we will work on setting up for him. Recently was in [place] IOP but states transportation issues made that difficult. Eventually his goal is to go to [facility] in [place].   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

## 2022-02-01 NOTE — ED Notes (Signed)
Pt sitting in dining room watching TV. A&O x4, calm and cooperative. Denies current SI/HI/AVH. No signs of distress noted. Monitoring for safety.  

## 2022-02-01 NOTE — Tx Team (Signed)
LCSW met with patient to assess current mood, affect, physical state, and inquire about needs/goals while here in [facility] and after discharge. Patient reports he presented due to needing to detox from fentanyl, meth, and cocaine. Patient reports he has been using about "3 points to a gram a day" of fentanyl for the past 8 years. Patient reports "very little" use of meth and cocaine stating "I probably use a whole gram over the course of one month, if that". Patient denied any other substance use. Patient reports he also presents with depression, however reports he has been struggling with depression for many years due to trauma in his past. Patient reports he had two brothers killed in a car wreck, father was an "abusive alcoholic", and reports little to no support from his biological mother as he was growing up. Patient reports experiencing SI in his younger years, however denies any recent or current SI/HI/AVH. Patient reports he currently lives in [place] with his father and grandfather, and reports a great deal of support from them. Patient reports his current goal is to get clean, find a job, and move away from his current environment. Patient reports he has a fiance in [place], [place] that has been supportive, and reports he will be looking into [facility] in that area once he discharges. Patient reports he is only interested in medication management at this time. Patient reports he was in the [facility] program at [facility] in [place], however reports it became too much of a demand for him so he dropped out. Patient reports he is willing to follow up with [facility] again for medication management services. Patient reports no other interest in [place] or substance abuse services. Patient aware that LCSW will provide follow up information in the patient's AVS for his review. No other needs were reported at this time by patient.   Lucius Conn, LCSW Clinical Social Worker [facility] Ph: [phone]

## 2022-02-01 NOTE — ED Notes (Signed)
Patient observed/assessed in room in bed appearing in no immediate distress resting peacefully. Q15 minute checks continued by MHT and nursing staff. Will continue to monitor and support. 

## 2022-02-01 NOTE — ED Notes (Signed)
Patient accepted scheduled meds w/o difficulty. Patient currently resting in assigned room on bed in no distress. Denied SI/HI/AVH. Safety maintained and will continue to monitor.

## 2022-02-01 NOTE — ED Notes (Signed)
Pt refused to attend group.

## 2022-02-02 DIAGNOSIS — F1994 Other psychoactive substance use, unspecified with psychoactive substance-induced mood disorder: Secondary | ICD-10-CM | POA: Diagnosis not present

## 2022-02-02 NOTE — ED Provider Notes (Signed)
Behavioral Health Progress Note  Date and Time: 02/02/2022 9:49 AM Name: Lance Marquez MRN:  [id number]  Subjective:  Patient seen lying in bed. Patient is day 2 of detox from fentanyl, ice, coke. Continues to report feeling "a little rough still." Reports body aches, feeling cold. Reports having tremors. Reports sleep wasn't too good and still reports decreased appetite. Denies any side effects from the zoloft. Still plans to live with dad and grandpa and getting connected with [facility]. He has not talked to family yet. He denies SI/HI/AVH.   Diagnosis:  Final diagnoses:  Substance induced mood disorder (HCC)  Uncomplicated opioid dependence (HCC)  Substance abuse (HCC)   Total Time spent with patient: 30 minutes  Past Psychiatric History: History of suicidal behavior, drug abuse and dependency, opioid withdrawal and substance-induced mood disorder. A reported history of PTSD, MDD and bipolar.    Past Medical History:  Past Medical History:  Diagnosis Date   Asthma    C. difficile diarrhea    Diarrhea    Drug abuse and dependence (HCC)    Parasites in stool    Suicidal behavior     Past Surgical History:  Procedure Laterality Date   KNEE ARTHROSCOPY Right 03/22/2021   Procedure: ARTHROSCOPY KNEE;  Surgeon: Signa Kell, MD;  Location: [facility] ORS;  Service: Orthopedics;  Laterality: Right;   Family History: No family history on file. Family Psychiatric  History: Unknown Social History:  Social History   Substance and Sexual Activity  Alcohol Use Yes   Comment: occ     Social History   Substance and Sexual Activity  Drug Use Yes   Types: IV, Methamphetamines   Comment: heroin, meth    Social History   Socioeconomic History   Marital status: Single    Spouse name: Not on file   Number of children: Not on file   Years of education: Not on file   Highest education level: Not on file  Occupational History   Not on file  Tobacco Use   Smoking status: Every Day     Types: Cigarettes   Smokeless tobacco: Never  Substance and Sexual Activity   Alcohol use: Yes    Comment: occ   Drug use: Yes    Types: IV, Methamphetamines    Comment: heroin, meth   Sexual activity: Never  Other Topics Concern   Not on file  Social History Narrative   Not on file   Social Determinants of Health   Financial Resource Strain: Not on file  Food Insecurity: Not on file  Transportation Needs: Not on file  Physical Activity: Not on file  Stress: Not on file  Social Connections: Not on file   SDOH:  SDOH Screenings   Depression (PHQ2-9): High Risk (02/01/2022)  Tobacco Use: High Risk (12/07/2021)   Additional Social History:    Pain Medications: See MAR Prescriptions: See MAR Over the Counter: See MAR History of alcohol / drug use?: Yes Negative Consequences of Use: Legal, Work / School Withdrawal Symptoms: None Name of Substance 1: Fent 1 - Age of First Use: 19 1 - Amount (size/oz): 3-4 SHOTS 1 - Frequency: daily 1 - Duration: ongoing 1 - Last Use / Amount: 4-5 hours ago 1 - Method of Aquiring: unknown 1- Route of Use: IV Name of Substance 2: Meth 2 - Age of First Use: 23 2 - Amount (size/oz): 3-4 hots 2 - Frequency: couple times a month 2 - Duration: ongoing 2 - Last Use / Amount: yesterday  2 - Method of Aquiring: unknown 2 - Route of Substance Use: IV Name of Substance 3: Cocaine 3 - Age of First Use: 21 3 - Amount (size/oz): unknown 3 - Frequency: couple times a month 3 - Duration: ongoing 3 - Last Use / Amount: yesterday 3 - Method of Aquiring: unknown 3 - Route of Substance Use: IV             Sleep: Fair  Appetite:  Fair  Current Medications:  Current Facility-Administered Medications  Medication Dose Route Frequency Provider Last Rate Last Admin   acetaminophen (TYLENOL) tablet 650 mg  650 mg Oral Q6H PRN White, Patrice L, NP       alum & mag hydroxide-simeth (MAALOX/MYLANTA) 200-200-20 MG/5ML suspension 30 mL  30 mL Oral  Q4H PRN White, Patrice L, NP       cloNIDine (CATAPRES) tablet 0.1 mg  0.1 mg Oral QID White, Patrice L, NP   0.1 mg at 02/02/22 0935   Followed by   Melene Muller ON 02/03/2022] cloNIDine (CATAPRES) tablet 0.1 mg  0.1 mg Oral BH-qamhs White, Patrice L, NP       Followed by   Melene Muller ON 02/05/2022] cloNIDine (CATAPRES) tablet 0.1 mg  0.1 mg Oral QAC breakfast White, Patrice L, NP       dicyclomine (BENTYL) tablet 20 mg  20 mg Oral Q6H PRN White, Patrice L, NP       hydrOXYzine (ATARAX) tablet 25 mg  25 mg Oral TID PRN White, Patrice L, NP   25 mg at 01/31/22 2026   loperamide (IMODIUM) capsule 2-4 mg  2-4 mg Oral PRN White, Patrice L, NP       magnesium hydroxide (MILK OF MAGNESIA) suspension 30 mL  30 mL Oral Daily PRN White, Patrice L, NP       methocarbamol (ROBAXIN) tablet 500 mg  500 mg Oral Q8H PRN White, Patrice L, NP       naproxen (NAPROSYN) tablet 500 mg  500 mg Oral BID PRN White, Patrice L, NP       ondansetron (ZOFRAN-ODT) disintegrating tablet 4 mg  4 mg Oral Q6H PRN White, Patrice L, NP       sertraline (ZOLOFT) tablet 50 mg  50 mg Oral Daily Karie Fetch, MD   50 mg at 02/02/22 0935   traZODone (DESYREL) tablet 50 mg  50 mg Oral QHS PRN White, Patrice L, NP   50 mg at 02/01/22 2120   No current outpatient medications on file.    Labs  Lab Results:  Admission on 01/31/2022  Component Date Value Ref Range Status   SARS Coronavirus 2 by RT PCR 01/31/2022 NEGATIVE  NEGATIVE Final   Comment: (NOTE) SARS-CoV-2 target nucleic acids are NOT DETECTED.  The SARS-CoV-2 RNA is generally detectable in upper respiratory specimens during the acute phase of infection. The lowest concentration of SARS-CoV-2 viral copies this assay can detect is 138 copies/mL. A negative result does not preclude SARS-Cov-2 infection and should not be used as the sole basis for treatment or other patient management decisions. A negative result may occur with  improper specimen collection/handling,  submission of specimen other than nasopharyngeal swab, presence of viral mutation(s) within the areas targeted by this assay, and inadequate number of viral copies(<138 copies/mL). A negative result must be combined with clinical observations, patient history, and epidemiological information. The expected result is Negative.  Fact Sheet for Patients:  BloggerCourse.com  Fact Sheet for Healthcare Providers:  SeriousBroker.it  This test is  no                          t yet approved or cleared by the [place] and  has been authorized for detection and/or diagnosis of SARS-CoV-2 by FDA under an Emergency Use Authorization (EUA). This EUA will remain  in effect (meaning this test can be used) for the duration of the COVID-19 declaration under Section 564(b)(1) of the Act, 21 U.S.C.section 360bbb-3(b)(1), unless the authorization is terminated  or revoked sooner.       Influenza A by PCR 01/31/2022 NEGATIVE  NEGATIVE Final   Influenza B by PCR 01/31/2022 NEGATIVE  NEGATIVE Final   Comment: (NOTE) The Xpert Xpress SARS-CoV-2/FLU/RSV plus assay is intended as an aid in the diagnosis of influenza from Nasopharyngeal swab specimens and should not be used as a sole basis for treatment. Nasal washings and aspirates are unacceptable for Xpert Xpress SARS-CoV-2/FLU/RSV testing.  Fact Sheet for Patients: BloggerCourse.com  Fact Sheet for Healthcare Providers: SeriousBroker.it  This test is not yet approved or cleared by the [place] FDA and has been authorized for detection and/or diagnosis of SARS-CoV-2 by FDA under an Emergency Use Authorization (EUA). This EUA will remain in effect (meaning this test can be used) for the duration of the COVID-19 declaration under Section 564(b)(1) of the Act, 21 U.S.C. section 360bbb-3(b)(1), unless the authorization is terminated  or revoked.     Resp Syncytial Virus by PCR 01/31/2022 NEGATIVE  NEGATIVE Final   Comment: (NOTE) Fact Sheet for Patients: BloggerCourse.com  Fact Sheet for Healthcare Providers: SeriousBroker.it  This test is not yet approved or cleared by the [place] FDA and has been authorized for detection and/or diagnosis of SARS-CoV-2 by FDA under an Emergency Use Authorization (EUA). This EUA will remain in effect (meaning this test can be used) for the duration of the COVID-19 declaration under Section 564(b)(1) of the Act, 21 U.S.C. section 360bbb-3(b)(1), unless the authorization is terminated or revoked.  Performed at [facility], [address], [place], [place] [address]    Sodium 01/31/2022 136  135 - 145 mmol/L Final   Potassium 01/31/2022 4.2  3.5 - 5.1 mmol/L Final   Chloride 01/31/2022 104  98 - 111 mmol/L Final   CO2 01/31/2022 22  22 - 32 mmol/L Final   Glucose, Bld 01/31/2022 95  70 - 99 mg/dL Final   Glucose reference range applies only to samples taken after fasting for at least 8 hours.   BUN 01/31/2022 15  6 - 20 mg/dL Final   Creatinine, Ser 01/31/2022 0.90  0.61 - 1.24 mg/dL Final   Calcium 70/35/0093 9.2  8.9 - 10.3 mg/dL Final   Total Protein 81/82/9937 8.0  6.5 - 8.1 g/dL Final   Albumin 16/96/7893 3.7  3.5 - 5.0 g/dL Final   AST 81/02/7508 20  15 - 41 U/L Final   ALT 01/31/2022 13  0 - 44 U/L Final   Alkaline Phosphatase 01/31/2022 74  38 - 126 U/L Final   Total Bilirubin 01/31/2022 0.6  0.3 - 1.2 mg/dL Final   GFR, Estimated 01/31/2022 >60  >60 mL/min Final   Comment: (NOTE) Calculated using the CKD-EPI Creatinine Equation (2021)    Anion gap 01/31/2022 10  5 - 15 Final   Performed at [facility], [address] [place], [place] [address]   Alcohol, Ethyl (B) 01/31/2022 <10  <10 mg/dL Final   Comment: (NOTE) Lowest  detectable limit for serum alcohol is 10 mg/dL.  For medical purposes  only. Performed at [facility], [address] [place], [place] [address]    POC Amphetamine UR 01/31/2022 Positive (A)  NONE DETECTED (Cut Off Level 1000 ng/mL) Final   POC Secobarbital (BAR) 01/31/2022 None Detected  NONE DETECTED (Cut Off Level 300 ng/mL) Final   POC Buprenorphine (BUP) 01/31/2022 None Detected  NONE DETECTED (Cut Off Level 10 ng/mL) Final   POC Oxazepam (BZO) 01/31/2022 None Detected  NONE DETECTED (Cut Off Level 300 ng/mL) Final   POC Cocaine UR 01/31/2022 Positive (A)  NONE DETECTED (Cut Off Level 300 ng/mL) Final   POC Methamphetamine UR 01/31/2022 Positive (A)  NONE DETECTED (Cut Off Level 1000 ng/mL) Final   POC Morphine 01/31/2022 Positive (A)  NONE DETECTED (Cut Off Level 300 ng/mL) Final   POC Methadone UR 01/31/2022 None Detected  NONE DETECTED (Cut Off Level 300 ng/mL) Final   POC Oxycodone UR 01/31/2022 None Detected  NONE DETECTED (Cut Off Level 100 ng/mL) Final   POC Marijuana UR 01/31/2022 Positive (A)  NONE DETECTED (Cut Off Level 50 ng/mL) Final   HIV Screen 4th Generation wRfx 01/31/2022 Non Reactive  Non Reactive Final   Performed at [facility], [address], [place], [place] [address]   SARSCOV2ONAVIRUS 2 AG 01/31/2022 NEGATIVE  NEGATIVE Final   Comment: (NOTE) SARS-CoV-2 antigen NOT DETECTED.   Negative results are presumptive.  Negative results do not preclude SARS-CoV-2 infection and should not be used as the sole basis for treatment or other patient management decisions, including infection  control decisions, particularly in the presence of clinical signs and  symptoms consistent with COVID-19, or in those who have been in contact with the virus.  Negative results must be combined with clinical observations, patient history, and epidemiological information. The expected result is Negative.  Fact Sheet for Patients: https://www.jennings-kim.com/  Fact Sheet for Healthcare  Providers: https://alexander-rogers.biz/  This test is not yet approved or cleared by the [place] FDA and  has been authorized for detection and/or diagnosis of SARS-CoV-2 by FDA under an Emergency Use Authorization (EUA).  This EUA will remain in effect (meaning this test can be used) for the duration of  the COV                          ID-19 declaration under Section 564(b)(1) of the Act, 21 U.S.C. section 360bbb-3(b)(1), unless the authorization is terminated or revoked sooner.     Cholesterol 01/31/2022 113  0 - 200 mg/dL Final   Triglycerides 14/48/1856 109  <150 mg/dL Final   HDL 31/49/7026 21 (L)  >40 mg/dL Final   Total CHOL/HDL Ratio 01/31/2022 5.4  RATIO Final   VLDL 01/31/2022 22  0 - 40 mg/dL Final   LDL Cholesterol 01/31/2022 70  0 - 99 mg/dL Final   Comment:        Total Cholesterol/HDL:CHD Risk Coronary Heart Disease Risk Table                     Men   Women  1/2 Average Risk   3.4   3.3  Average Risk       5.0   4.4  2 X Average Risk   9.6   7.1  3 X Average Risk  23.4   11.0        Use the calculated Patient Ratio above and the  CHD Risk Table to determine the patient's CHD Risk.        ATP III CLASSIFICATION (LDL):  <100     mg/dL   Optimal  161-096100-129  mg/dL   Near or Above                    Optimal  130-159  mg/dL   Borderline  [id number]  mg/dL   High  >811>190     mg/dL   Very High Performed at [facility], [address] [place], [place] [address]    TSH 01/31/2022 2.984  0.350 - 4.500 uIU/mL Final   Comment: Performed by a 3rd Generation assay with a functional sensitivity of <=0.01 uIU/mL. Performed at [facility], [address] [place], [place] [address]     Blood Alcohol level:  Lab Results  Component Value Date   ETH <10 01/31/2022   ETH <10 02/28/2019    Metabolic Disorder Labs: No results found for: "HGBA1C", "MPG" No results found for: "PROLACTIN" Lab Results  Component Value Date   CHOL 113  01/31/2022   TRIG 109 01/31/2022   HDL 21 (L) 01/31/2022   CHOLHDL 5.4 01/31/2022   VLDL 22 01/31/2022   LDLCALC 70 01/31/2022    Therapeutic Lab Levels: No results found for: "LITHIUM" No results found for: "VALPROATE" No results found for: "CBMZ"  Physical Findings   PHQ2-9    Flowsheet Row ED [facility]20[facility]  PHQ-2 Total Score 3  PHQ-9 Total Score 16      Flowsheet Row ED from [facility]/31/2022 [facility] ED from 12/07/2021 [facility] Admission (Discharged) from 04/18/2021 in [facility]  C-SSRS RISK CATEGORY No Risk No Risk No Risk        Musculoskeletal  Strength & Muscle Tone: within normal limits Gait & Station:  did not assess Patient leans: N/A  Mental Status Exam   Appearance and Grooming: Patient is casually dressed and appears disheveled and tired. The patient has no noticeable scent or odor. Motor activity: The patient's movement and gait were not observed. There was no notable abnormal facial movements and no notable abnormal extremity movements. Behavior: The patient appears in no acute distress, and during the interview, was focused, required minimal redirection, and behaving appropriately to scenario; he was able to follow commands and compliant to requests and made minimal eye contact. The patient did not appear internally or externally preoccupied. Attitude: Patient's attitude towards the interviewer was cooperative and open. Speech: The patient's speech was clear, fluent, with good articulation, and with appropriately placed inflections. The volume of his speech was normal and normal in quantity. The rate was normal with a normal rhythm. Responses were normal in latency. There were no abnormal patterns in speech. Mood: "Little rough still" Affect: Patient's affect is flat with broad range and even fluctuations; his  affect is congruent with his stated mood. ------------------------------------------------------------------------------------------------------------------------- Thought Content The patient experiences no hallucinations. The patient describes no delusional thoughts;   Patient at the time of interview denies active suicidal intent and denies passive suicidal ideation; he denies homicidal intent. Thought Process The patient's thought process is linear and is goal-directed. Insight The patient at the time of interview demonstrates fair insight Judgement The patient over the past 24 hours demonstrates poor judgement, as evidenced by not participating in group.  Memory: limited  Executive Functions  Concentration: intact Attention  Span: fair Recall: intact Fund of Knowledge: fair  Alertness/Orientation: answering questions appropriately   Assets  Assets: Communication Skills; Desire for Improvement; Financial Resources/Insurance   Sleep  Fair   Physical Exam  Constitutional:      Appearance: the patient is not toxic-appearing. Tattoos visible.  Pulmonary:     Effort: Pulmonary effort is normal.  Neurological:     General: No focal deficit present.     Mental Status: the patient is alert and answering questions appropriately.   Review of Systems  Respiratory:  Negative for shortness of breath.   Cardiovascular:  Negative for chest pain.  Gastrointestinal:  Negative for abdominal pain, constipation, diarrhea, and vomiting. Neurological:  Negative for headaches.  MSK: Positive for body aches.   Blood pressure 105/70, pulse 64, temperature 97.8 F (36.6 C), temperature source Oral, resp. rate 17, height  (1.753 m), weight 165 lb (74.8 kg), SpO2 100 %. Body mass index is 24.37 kg/m.  Treatment Plan Summary: Daily contact with patient to assess and evaluate symptoms and progress in treatment  Patient undergoing detox from fentanyl, ice, and coke with prior rehab 7  months ago in [place]. Fentanyl use is most regular and recent, reports daily use. UDS+amphetamine, cocaine, meth, morphine, MJ. Reported psychiatric history of depression, PTSD, and bipolar. Prior medication trial includes wellbutrin when he was 26 years old. Reports symptoms of depression, was amenable to starting on zoloft. Will increase zoloft to  to address his depression. He is interested in outpatient therapy and medication management, we will work on setting up for him. Recently was in [facility] IOP but states transportation issues made that difficult. Eventually his goal is to go to [facility] in [place].   #Fentanyl use disorder #Methamphetamine use disorder #Cocaine use disorder -Continue clonidine taper  -Continue PRNs -Tylenol 650 mg q6hr PRN for mild pain -Mylanta 30 ml suspension for indigestion -Milk of Magnesia 30 ml for constipation -Trazodone 50 mg for insomnia -Hydroxyzine 25 mg tid PRN for anxiety   #Depression -Increase zoloft to  today    Karie Fetch, MD, PGY-1 02/02/2022 9:49 AM

## 2022-02-02 NOTE — ED Notes (Signed)
Pt resting in bed. A&O x4, calm and cooperative. Denies current SI/HI/AVH. No signs of distress noted. Monitoring for safety. 

## 2022-02-02 NOTE — ED Notes (Signed)
Patient is asleep in bed without issue or complaint.  No distress or evidence of active withdrawal.  Will monitor.

## 2022-02-02 NOTE — ED Notes (Signed)
Snacks given 

## 2022-02-02 NOTE — ED Notes (Signed)
Pt asleep in bed. Respirations even and unlabored. Monitoring for safety. 

## 2022-02-02 NOTE — Progress Notes (Signed)
Patient is resting in bed.  Denies withdrawal symptoms.  Did not want lunch.  Po fluids encouraged.  No distress.  Encouraged to make needs known.  Will continue to monitor.

## 2022-02-03 DIAGNOSIS — F1994 Other psychoactive substance use, unspecified with psychoactive substance-induced mood disorder: Secondary | ICD-10-CM | POA: Diagnosis not present

## 2022-02-03 NOTE — Progress Notes (Signed)
Patient complained of muscle aches and cramping.  PO PRN of naproxen and robaxin given.

## 2022-02-03 NOTE — ED Provider Notes (Signed)
Behavioral Health Progress Note  Date and Time: 02/03/2022 8:47 AM Name: Lance Marquez MRN:  [id number]  Reason for admission: Keyan Folson is a 26 y.o. male with depressed mood, likely substance-induced and requesting detox from fentanyl, ice, and coke.   Subjective:  Patient seen lying in bed. Patient is day 3 of detox from fentanyl, ice, coke. Continues on clonidine taper. Reports improved sleep and decreased withdrawal symptoms today. Reports improved appetite. Denies any side effects from the zoloft. Still plans to live with dad and grandpa and getting connected with [facility]. Reports mood overall is better. He has not talked to family yet but reports he will today. He denies SI/HI/AVH.   Diagnosis:  Final diagnoses:  Substance induced mood disorder (HCC)  Uncomplicated opioid dependence (HCC)  Substance abuse (HCC)   Total Time spent with patient: 30 minutes  Past Psychiatric History: History of suicidal behavior, drug abuse and dependency, opioid withdrawal and substance-induced mood disorder. A reported history of PTSD, MDD and bipolar.    Past Medical History:  Past Medical History:  Diagnosis Date   Asthma    C. difficile diarrhea    Diarrhea    Drug abuse and dependence (HCC)    Parasites in stool    Suicidal behavior     Past Surgical History:  Procedure Laterality Date   KNEE ARTHROSCOPY Right 03/22/2021   Procedure: ARTHROSCOPY KNEE;  Surgeon: Signa Kell, MD;  Location: [facility] ORS;  Service: Orthopedics;  Laterality: Right;   Family History: No family history on file. Family Psychiatric  History: Unknown Social History:  Social History   Substance and Sexual Activity  Alcohol Use Yes   Comment: occ     Social History   Substance and Sexual Activity  Drug Use Yes   Types: IV, Methamphetamines   Comment: heroin, meth    Social History   Socioeconomic History   Marital status: Single    Spouse name: Not on file   Number of children: Not on  file   Years of education: Not on file   Highest education level: Not on file  Occupational History   Not on file  Tobacco Use   Smoking status: Every Day    Types: Cigarettes   Smokeless tobacco: Never  Substance and Sexual Activity   Alcohol use: Yes    Comment: occ   Drug use: Yes    Types: IV, Methamphetamines    Comment: heroin, meth   Sexual activity: Never  Other Topics Concern   Not on file  Social History Narrative   Not on file   Social Determinants of Health   Financial Resource Strain: Not on file  Food Insecurity: Not on file  Transportation Needs: Not on file  Physical Activity: Not on file  Stress: Not on file  Social Connections: Not on file   SDOH:  SDOH Screenings   Depression (PHQ2-9): High Risk (02/01/2022)  Tobacco Use: High Risk (12/07/2021)   Additional Social History:    Pain Medications: See MAR Prescriptions: See MAR Over the Counter: See MAR History of alcohol / drug use?: Yes Negative Consequences of Use: Legal, Work / School Withdrawal Symptoms: None Name of Substance 1: Fent 1 - Age of First Use: 19 1 - Amount (size/oz): 3-4 SHOTS 1 - Frequency: daily 1 - Duration: ongoing 1 - Last Use / Amount: 4-5 hours ago 1 - Method of Aquiring: unknown 1- Route of Use: IV Name of Substance 2: Meth 2 - Age of First Use:  23 2 - Amount (size/oz): 3-4 hots 2 - Frequency: couple times a month 2 - Duration: ongoing 2 - Last Use / Amount: yesterday 2 - Method of Aquiring: unknown 2 - Route of Substance Use: IV Name of Substance 3: Cocaine 3 - Age of First Use: 21 3 - Amount (size/oz): unknown 3 - Frequency: couple times a month 3 - Duration: ongoing 3 - Last Use / Amount: yesterday 3 - Method of Aquiring: unknown 3 - Route of Substance Use: IV             Sleep: Fair  Appetite:  Fair  Current Medications:  Current Facility-Administered Medications  Medication Dose Route Frequency Provider Last Rate Last Admin   acetaminophen  (TYLENOL) tablet 650 mg  650 mg Oral Q6H PRN White, Patrice L, NP       alum & mag hydroxide-simeth (MAALOX/MYLANTA) 200-200-20 MG/5ML suspension 30 mL  30 mL Oral Q4H PRN White, Patrice L, NP       cloNIDine (CATAPRES) tablet 0.1 mg  0.1 mg Oral QID White, Patrice L, NP   0.1 mg at 02/02/22 2110   Followed by   cloNIDine (CATAPRES) tablet 0.1 mg  0.1 mg Oral BH-qamhs White, Patrice L, NP       Followed by   Melene Muller ON 02/05/2022] cloNIDine (CATAPRES) tablet 0.1 mg  0.1 mg Oral QAC breakfast White, Patrice L, NP       dicyclomine (BENTYL) tablet 20 mg  20 mg Oral Q6H PRN White, Patrice L, NP       hydrOXYzine (ATARAX) tablet 25 mg  25 mg Oral TID PRN White, Patrice L, NP   25 mg at 02/02/22 1323   loperamide (IMODIUM) capsule 2-4 mg  2-4 mg Oral PRN White, Patrice L, NP       magnesium hydroxide (MILK OF MAGNESIA) suspension 30 mL  30 mL Oral Daily PRN White, Patrice L, NP       methocarbamol (ROBAXIN) tablet 500 mg  500 mg Oral Q8H PRN White, Patrice L, NP   500 mg at 02/02/22 1323   naproxen (NAPROSYN) tablet 500 mg  500 mg Oral BID PRN White, Patrice L, NP   500 mg at 02/02/22 1323   ondansetron (ZOFRAN-ODT) disintegrating tablet 4 mg  4 mg Oral Q6H PRN White, Patrice L, NP       sertraline (ZOLOFT) tablet 50 mg  50 mg Oral Daily Karie Fetch, MD   50 mg at 02/02/22 0935   traZODone (DESYREL) tablet 50 mg  50 mg Oral QHS PRN White, Patrice L, NP   50 mg at 02/02/22 2109   No current outpatient medications on file.    Labs  Lab Results:  Admission on 01/31/2022  Component Date Value Ref Range Status   SARS Coronavirus 2 by RT PCR 01/31/2022 NEGATIVE  NEGATIVE Final   Comment: (NOTE) SARS-CoV-2 target nucleic acids are NOT DETECTED.  The SARS-CoV-2 RNA is generally detectable in upper respiratory specimens during the acute phase of infection. The lowest concentration of SARS-CoV-2 viral copies this assay can detect is 138 copies/mL. A negative result does not preclude  SARS-Cov-2 infection and should not be used as the sole basis for treatment or other patient management decisions. A negative result may occur with  improper specimen collection/handling, submission of specimen other than nasopharyngeal swab, presence of viral mutation(s) within the areas targeted by this assay, and inadequate number of viral copies(<138 copies/mL). A negative result must be combined with clinical observations, patient  history, and epidemiological information. The expected result is Negative.  Fact Sheet for Patients:  BloggerCourse.com  Fact Sheet for Healthcare Providers:  SeriousBroker.it  This test is no                          t yet approved or cleared by the [place] FDA and  has been authorized for detection and/or diagnosis of SARS-CoV-2 by FDA under an Emergency Use Authorization (EUA). This EUA will remain  in effect (meaning this test can be used) for the duration of the COVID-19 declaration under Section 564(b)(1) of the Act, 21 U.S.C.section 360bbb-3(b)(1), unless the authorization is terminated  or revoked sooner.       Influenza A by PCR 01/31/2022 NEGATIVE  NEGATIVE Final   Influenza B by PCR 01/31/2022 NEGATIVE  NEGATIVE Final   Comment: (NOTE) The Xpert Xpress SARS-CoV-2/FLU/RSV plus assay is intended as an aid in the diagnosis of influenza from Nasopharyngeal swab specimens and should not be used as a sole basis for treatment. Nasal washings and aspirates are unacceptable for Xpert Xpress SARS-CoV-2/FLU/RSV testing.  Fact Sheet for Patients: BloggerCourse.com  Fact Sheet for Healthcare Providers: SeriousBroker.it  This test is not yet approved or cleared by the [place] FDA and has been authorized for detection and/or diagnosis of SARS-CoV-2 by FDA under an Emergency Use Authorization (EUA). This EUA will remain in effect  (meaning this test can be used) for the duration of the COVID-19 declaration under Section 564(b)(1) of the Act, 21 U.S.C. section 360bbb-3(b)(1), unless the authorization is terminated or revoked.     Resp Syncytial Virus by PCR 01/31/2022 NEGATIVE  NEGATIVE Final   Comment: (NOTE) Fact Sheet for Patients: BloggerCourse.com  Fact Sheet for Healthcare Providers: SeriousBroker.it  This test is not yet approved or cleared by the [place] FDA and has been authorized for detection and/or diagnosis of SARS-CoV-2 by FDA under an Emergency Use Authorization (EUA). This EUA will remain in effect (meaning this test can be used) for the duration of the COVID-19 declaration under Section 564(b)(1) of the Act, 21 U.S.C. section 360bbb-3(b)(1), unless the authorization is terminated or revoked.  Performed at [facility], [address] [place], [place] [address]    Sodium 01/31/2022 136  135 - 145 mmol/L Final   Potassium 01/31/2022 4.2  3.5 - 5.1 mmol/L Final   Chloride 01/31/2022 104  98 - 111 mmol/L Final   CO2 01/31/2022 22  22 - 32 mmol/L Final   Glucose, Bld 01/31/2022 95  70 - 99 mg/dL Final   Glucose reference range applies only to samples taken after fasting for at least 8 hours.   BUN 01/31/2022 15  6 - 20 mg/dL Final   Creatinine, Ser 01/31/2022 0.90  0.61 - 1.24 mg/dL Final   Calcium 32/95/1884 9.2  8.9 - 10.3 mg/dL Final   Total Protein 16/60/6301 8.0  6.5 - 8.1 g/dL Final   Albumin 60/11/9321 3.7  3.5 - 5.0 g/dL Final   AST 55/73/2202 20  15 - 41 U/L Final   ALT 01/31/2022 13  0 - 44 U/L Final   Alkaline Phosphatase 01/31/2022 74  38 - 126 U/L Final   Total Bilirubin 01/31/2022 0.6  0.3 - 1.2 mg/dL Final   GFR, Estimated 01/31/2022 >60  >60 mL/min Final   Comment: (NOTE) Calculated using the CKD-EPI Creatinine Equation (2021)    Anion gap 01/31/2022 10  5 - 15 Final   Performed  at [facility], [address] [place], [place] [address]   Alcohol, Ethyl (B) 01/31/2022 <10  <10 mg/dL Final   Comment: (NOTE) Lowest detectable limit for serum alcohol is 10 mg/dL.  For medical purposes only. Performed at [facility], [address] [place], [place] [address]    POC Amphetamine UR 01/31/2022 Positive (A)  NONE DETECTED (Cut Off Level 1000 ng/mL) Final   POC Secobarbital (BAR) 01/31/2022 None Detected  NONE DETECTED (Cut Off Level 300 ng/mL) Final   POC Buprenorphine (BUP) 01/31/2022 None Detected  NONE DETECTED (Cut Off Level 10 ng/mL) Final   POC Oxazepam (BZO) 01/31/2022 None Detected  NONE DETECTED (Cut Off Level 300 ng/mL) Final   POC Cocaine UR 01/31/2022 Positive (A)  NONE DETECTED (Cut Off Level 300 ng/mL) Final   POC Methamphetamine UR 01/31/2022 Positive (A)  NONE DETECTED (Cut Off Level 1000 ng/mL) Final   POC Morphine 01/31/2022 Positive (A)  NONE DETECTED (Cut Off Level 300 ng/mL) Final   POC Methadone UR 01/31/2022 None Detected  NONE DETECTED (Cut Off Level 300 ng/mL) Final   POC Oxycodone UR 01/31/2022 None Detected  NONE DETECTED (Cut Off Level 100 ng/mL) Final   POC Marijuana UR 01/31/2022 Positive (A)  NONE DETECTED (Cut Off Level 50 ng/mL) Final   HIV Screen 4th Generation wRfx 01/31/2022 Non Reactive  Non Reactive Final   Performed at [facility], [address] [place], [place] [address]   SARSCOV2ONAVIRUS 2 AG 01/31/2022 NEGATIVE  NEGATIVE Final   Comment: (NOTE) SARS-CoV-2 antigen NOT DETECTED.   Negative results are presumptive.  Negative results do not preclude SARS-CoV-2 infection and should not be used as the sole basis for treatment or other patient management decisions, including infection  control decisions, particularly in the presence of clinical signs and  symptoms consistent with COVID-19, or in those who have been in contact with the virus.  Negative results must be combined with clinical observations, patient history, and  epidemiological information. The expected result is Negative.  Fact Sheet for Patients: https://www.jennings-kim.com/https://www.fda.gov/media/141569/download  Fact Sheet for Healthcare Providers: https://alexander-rogers.biz/https://www.fda.gov/media/141568/download  This test is not yet approved or cleared by the [place] FDA and  has been authorized for detection and/or diagnosis of SARS-CoV-2 by FDA under an Emergency Use Authorization (EUA).  This EUA will remain in effect (meaning this test can be used) for the duration of  the COV                          ID-19 declaration under Section 564(b)(1) of the Act, 21 U.S.C. section 360bbb-3(b)(1), unless the authorization is terminated or revoked sooner.     Cholesterol 01/31/2022 113  0 - 200 mg/dL Final   Triglycerides 91/47/829512/21/2023 109  <150 mg/dL Final   HDL 62/13/086512/21/2023 21 (L)  >40 mg/dL Final   Total CHOL/HDL Ratio 01/31/2022 5.4  RATIO Final   VLDL 01/31/2022 22  0 - 40 mg/dL Final   LDL Cholesterol 01/31/2022 70  0 - 99 mg/dL Final   Comment:        Total Cholesterol/HDL:CHD Risk Coronary Heart Disease Risk Table                     Men   Women  1/2 Average Risk   3.4   3.3  Average Risk       5.0   4.4  2 X Average Risk   9.6  7.1  3 X Average Risk  23.4   11.0        Use the calculated Patient Ratio above and the CHD Risk Table to determine the patient's CHD Risk.        ATP III CLASSIFICATION (LDL):  <100     mg/dL   Optimal  811-572  mg/dL   Near or Above                    Optimal  130-159  mg/dL   Borderline  620-355  mg/dL   High  >974     mg/dL   Very High Performed at [facility], [address] [place], [place] [address]    TSH 01/31/2022 2.984  0.350 - 4.500 uIU/mL Final   Comment: Performed by a 3rd Generation assay with a functional sensitivity of <=0.01 uIU/mL. Performed at [facility] [place] [facility], [address], [place], [place] [address]     Blood Alcohol level:  Lab Results  Component Value Date   ETH <10 01/31/2022   ETH <10 02/28/2019     Metabolic Disorder Labs: No results found for: "HGBA1C", "MPG" No results found for: "PROLACTIN" Lab Results  Component Value Date   CHOL 113 01/31/2022   TRIG 109 01/31/2022   HDL 21 (L) 01/31/2022   CHOLHDL 5.4 01/31/2022   VLDL 22 01/31/2022   LDLCALC 70 01/31/2022    Therapeutic Lab Levels: No results found for: "LITHIUM" No results found for: "VALPROATE" No results found for: "CBMZ"  Physical Findings   PHQ2-9    Flowsheet Row ED from 01/31/2022 in [facility]  PHQ-2 Total Score 3  PHQ-9 Total Score 16      Flowsheet Row ED from 01/31/2022 in [facility] ED from 12/07/2021 [facility] Admission (Discharged) from 04/18/2021 in [facility]  C-SSRS RISK CATEGORY No Risk No Risk No Risk        Musculoskeletal  Strength & Muscle Tone: within normal limits Gait & Station:  did not assess Patient leans: N/A  Mental Status Exam   Appearance and Grooming: Patient is casually dressed and appears disheveled. The patient has no noticeable scent or odor. Motor activity: The patient's movement and gait were not observed. There was no notable abnormal facial movements and no notable abnormal extremity movements. Behavior: The patient appears in no acute distress, and during the interview, was focused, required minimal redirection, and behaving appropriately to scenario; he was able to follow commands and compliant to requests and made minimal eye contact. The patient did not appear internally or externally preoccupied. Attitude: Patient's attitude towards the interviewer was cooperative and open. Speech: The patient's speech was clear, fluent, with good articulation, and with appropriately placed inflections. The volume of his speech was normal and normal in quantity. The rate was normal with a normal rhythm. Responses were normal in latency. There were  no abnormal patterns in speech. Mood: "Overall better" - Less rough Affect: Patient's affect is flat with broad range and even fluctuations; his affect is congruent with his stated mood. ------------------------------------------------------------------------------------------------------------------------- Thought Content The patient experiences no hallucinations. The patient describes no delusional thoughts;   Patient at the time of interview denies active suicidal intent and denies passive suicidal ideation; he denies homicidal intent. Thought Process The patient's thought process is linear and is goal-directed. Insight The patient at the time of interview demonstrates fair insight Judgement The  patient over the past 24 hours demonstrates fair judgement  Memory: limited  Executive Functions  Concentration: intact Attention Span: fair Recall: intact Fund of Knowledge: fair  Alertness/Orientation: answering questions appropriately   Assets  Assets: Manufacturing systems engineer; Desire for Improvement; Financial Resources/Insurance  Sleep  Fair   Physical Exam  Constitutional:      Appearance: the patient is not toxic-appearing. Tattoos visible.  Pulmonary:     Effort: Pulmonary effort is normal.  Neurological:     General: No focal deficit present.     Mental Status: the patient is alert and answering questions appropriately.   Review of Systems  Respiratory:  Negative for shortness of breath.   Cardiovascular:  Negative for chest pain.  Gastrointestinal:  Negative for abdominal pain, constipation, diarrhea, and vomiting. Neurological:  Negative for headaches.   Blood pressure 125/78, pulse 78, temperature 97.6 F (36.4 C), temperature source Oral, resp. rate 18, height  (1.753 m), weight 165 lb (74.8 kg), SpO2 100 %. Body mass index is 24.37 kg/m.  Treatment Plan Summary: Daily contact with patient to assess and evaluate symptoms and progress in  treatment  Patient undergoing detox from fentanyl, ice, and coke with prior rehab 7 months ago in [place]. Fentanyl use is most regular and recent, reports daily use. UDS+amphetamine, cocaine, meth, morphine, MJ. Reported psychiatric history of depression, PTSD, and bipolar. Prior medication trial includes wellbutrin when he was 26 years old. Reports symptoms of depression, was amenable to starting on zoloft. Will continue zoloft to  to address his depression. He is interested in outpatient therapy and medication management, we will work on setting up for him. Recently was in [facility] IOP but states transportation issues made that difficult. Eventually his goal is to go to [facility] in [place].   #Fentanyl use disorder #Methamphetamine use disorder #Cocaine use disorder -Continue clonidine taper  -Continue PRNs -Tylenol 650 mg q6hr PRN for mild pain -Mylanta 30 ml suspension for indigestion -Milk of Magnesia 30 ml for constipation -Trazodone 50 mg for insomnia -Hydroxyzine 25 mg tid PRN for anxiety   #Depression -Continue zoloft   Karie Fetch, MD, PGY-1 02/03/2022 8:47 AM

## 2022-02-03 NOTE — ED Notes (Signed)
Patient was given his ankle bracelet after charging. Cord and charger are in the documentation room, Patient is planning on going home tomorrow with a "friend". Took all meds tonight, will continue to monitor for safety.

## 2022-02-03 NOTE — Progress Notes (Signed)
Patient got out of bed at lunch and ate some goldfish.  Patient stated that he still was feeling "bad" from fentanyl withdrawal.  Encouraged him to increase PO fluids.  Patient with active withdrawal at this time.  Support provided.  He is now resting in bed again.  Will continue to monitor.

## 2022-02-03 NOTE — ED Notes (Signed)
Patient is alert oriented x4. Patient ate dinner in the day room with other patients. He is pleasant and cooperative. Will continue to monitor for safety.

## 2022-02-03 NOTE — ED Notes (Signed)
Patient is asleep in bed without issue or complaint.  Will monitor.   °

## 2022-02-03 NOTE — ED Notes (Signed)
Pt asleep in bed. Respirations even and unlabored. Monitoring for safety. 

## 2022-02-03 NOTE — ED Notes (Signed)
Patient complains that the room is too hot.

## 2022-02-04 DIAGNOSIS — F1994 Other psychoactive substance use, unspecified with psychoactive substance-induced mood disorder: Secondary | ICD-10-CM | POA: Diagnosis not present

## 2022-02-04 MED ORDER — TRAZODONE HCL 50 MG PO TABS: 50.0000 mg | ORAL_TABLET | Freq: Every evening | ORAL | 0 refills | Status: AC | PRN

## 2022-02-04 MED ORDER — SERTRALINE HCL 50 MG PO TABS: 50.0000 mg | ORAL_TABLET | Freq: Every day | ORAL | 0 refills | Status: AC

## 2022-02-04 NOTE — ED Notes (Signed)
Pt states that he wants to be discharged. The provider has been in to speak with him.

## 2022-02-04 NOTE — ED Notes (Signed)
Patient continues to rest with no sxs of distress noted - will continue to monitor for safety 

## 2022-02-04 NOTE — ED Provider Notes (Addendum)
[facility]/OBS ASAP Discharge Summary  Date and Time: 02/04/2022 10:57 AM  Name: Lance Marquez  MRN:  [id number]   Discharge Diagnoses:  Final diagnoses:  Substance induced mood disorder ([place])  Uncomplicated opioid dependence ([place])  Substance abuse ([place])   Subjective: Patient seen lying in bed. Patient is day 4 of detox from fentanyl, ice, coke. Continues on clonidine taper. Reports poor sleep overnight, both falling and staying asleep. Reports decreased appetite. Overall feels a little bit better in terms of withdrawal symptoms. Reports mood is fine. Denies SI/HI/AVH. Reports he is trying to take it a day at a time and is goal is to get clean. Asked about his thoughts on substance use and he reports "I don't know." Discussed paradox between wanting to be clean and not thinking about his substance use. He reports he will continue thinking about it today. Talked with family yesterday, reports it "went good." Denies any side effects from the zoloft. He denies SI/HI/AVH.    Stay Summary: Lance Marquez is a 26 y.o. male with depressed mood, likely substance-induced and requesting detox from fentanyl, ice, and coke. He was started on clonidine taper at time of admission. He completed 5 days of clonidine taper by time of discharge. He reported symptoms of depression and was started on zoloft 25mg  and uptitrated to 50mg . He reported that his goals were to continue with medication management with [facility]. At time of discharge, his CIWA and COWS had been 0 for the past 24 hours. He was advised about completing his clonidine taper early and to go to ED if he had any symptoms of rebound hypertension. He was picked up by his cousin and stable at time of discharge. He denied SI/HI/AVH thoughout his hospitalization.   Total Time spent with patient: 30 minutes  Past Psychiatric History: History of suicidal behavior, drug abuse and dependency, opioid withdrawal and substance-induced mood disorder. A reported  history of PTSD, MDD and bipolar.     Past Medical History:  Past Medical History:  Diagnosis Date   Asthma    C. difficile diarrhea    Diarrhea    Drug abuse and dependence ([place])    Parasites in stool    Suicidal behavior     Past Surgical History:  Procedure Laterality Date   KNEE ARTHROSCOPY Right 03/22/2021   Procedure: ARTHROSCOPY KNEE;  Surgeon: Leim Fabry, MD;  Location: [facility] ORS;  Service: Orthopedics;  Laterality: Right;   Family History: No family history on file. Family Psychiatric History: Unknown Social History:  Social History   Substance and Sexual Activity  Alcohol Use Yes   Comment: occ     Social History   Substance and Sexual Activity  Drug Use Yes   Types: IV, Methamphetamines   Comment: heroin, meth    Social History   Socioeconomic History   Marital status: Single    Spouse name: Not on file   Number of children: Not on file   Years of education: Not on file   Highest education level: Not on file  Occupational History   Not on file  Tobacco Use   Smoking status: Every Day    Types: Cigarettes   Smokeless tobacco: Never  Substance and Sexual Activity   Alcohol use: Yes    Comment: occ   Drug use: Yes    Types: IV, Methamphetamines    Comment: heroin, meth   Sexual activity: Never  Other Topics Concern   Not on file  Social History Narrative  Not on file   Social Determinants of Health   Financial Resource Strain: Not on file  Food Insecurity: Not on file  Transportation Needs: Not on file  Physical Activity: Not on file  Stress: Not on file  Social Connections: Not on file   SDOH:  SDOH Screenings   Depression (PHQ2-9): High Risk (02/03/2022)  Tobacco Use: High Risk (12/07/2021)    Current Medications:  Current Facility-Administered Medications  Medication Dose Route Frequency Provider Last Rate Last Admin   acetaminophen (TYLENOL) tablet 650 mg  650 mg Oral Q6H PRN White, Patrice L, NP       alum & mag  hydroxide-simeth (MAALOX/MYLANTA) 200-200-20 MG/5ML suspension 30 mL  30 mL Oral Q4H PRN White, Patrice L, NP       cloNIDine (CATAPRES) tablet 0.1 mg  0.1 mg Oral BH-qamhs White, Patrice L, NP   0.1 mg at 02/04/22 [id number]   Followed by   Derrill Memo ON 02/05/2022] cloNIDine (CATAPRES) tablet 0.1 mg  0.1 mg Oral QAC breakfast White, Patrice L, NP       dicyclomine (BENTYL) tablet 20 mg  20 mg Oral Q6H PRN White, Patrice L, NP       hydrOXYzine (ATARAX) tablet 25 mg  25 mg Oral TID PRN White, Patrice L, NP   25 mg at 02/03/22 2139   loperamide (IMODIUM) capsule 2-4 mg  2-4 mg Oral PRN White, Patrice L, NP       magnesium hydroxide (MILK OF MAGNESIA) suspension 30 mL  30 mL Oral Daily PRN White, Patrice L, NP       methocarbamol (ROBAXIN) tablet 500 mg  500 mg Oral Q8H PRN White, Patrice L, NP   500 mg at 02/03/22 1446   naproxen (NAPROSYN) tablet 500 mg  500 mg Oral BID PRN White, Patrice L, NP   500 mg at 02/03/22 1446   ondansetron (ZOFRAN-ODT) disintegrating tablet 4 mg  4 mg Oral Q6H PRN White, Patrice L, NP       sertraline (ZOLOFT) tablet 50 mg  50 mg Oral Daily Rolanda Lundborg, MD   50 mg at 02/04/22 0901   traZODone (DESYREL) tablet 50 mg  50 mg Oral QHS PRN White, Patrice L, NP   50 mg at 02/03/22 2139   No current outpatient medications on file.    PTA Medications: (Not in a hospital admission)      02/03/2022    3:07 PM 02/01/2022    4:24 PM 01/31/2022    6:44 PM  Depression screen PHQ 2/9  Decreased Interest 1 0 0  Down, Depressed, Hopeless 1 3 3   PHQ - 2 Score 2 3 3   Altered sleeping 2 3 3   Tired, decreased energy 2 3 3   Change in appetite 2 3 3   Feeling bad or failure about yourself  1 3 3   Trouble concentrating 1 0 0  Moving slowly or fidgety/restless 1 1 0  Suicidal thoughts 0 0 0  PHQ-9 Score 11 16 15   Difficult doing work/chores Somewhat difficult  Somewhat difficult    Flowsheet Row ED from 01/31/2022 in [facility] ED from 12/07/2021 in  [facility] Admission (Discharged) from 04/18/2021 in [place] No Risk No Risk No Risk       Musculoskeletal  Strength & Muscle Tone: within normal limits Gait & Station: normal Patient leans: N/A  Psychiatric Specialty Exam  Appearance and Grooming: Patient is casually dressed  and appears disheveled. The patient has no noticeable scent or odor. Motor activity: The patient's movement and gait were not observed. There was no notable abnormal facial movements and no notable abnormal extremity movements. Behavior: The patient appears in no acute distress, and during the interview, was focused, required minimal redirection, and behaving appropriately to scenario; he was able to follow commands and compliant to requests and made minimal eye contact. The patient did not appear internally or externally preoccupied. Attitude: Patient's attitude towards the interviewer was cooperative and open. Speech: The patient's speech was clear, fluent, with good articulation, and with appropriately placed inflections. The volume of his speech was normal and normal in quantity. The rate was normal with a normal rhythm. Responses were normal in latency. There were no abnormal patterns in speech. Mood: "Little bit better" Affect: Patient's affect is flat with broad range and even fluctuations; his affect is congruent with his stated mood. ------------------------------------------------------------------------------------------------------------------------- Thought Content The patient experiences no hallucinations. The patient describes no delusional thoughts;   Patient at the time of interview denies active suicidal intent and denies passive suicidal ideation; he denies homicidal intent. Thought Process The patient's thought process is linear and is goal-directed. Insight The patient at the time of interview demonstrates  fair insight Judgement The patient over the past 24 hours demonstrates fair judgement   Memory: limited   Executive Functions  Concentration: intact Attention Span: fair Recall: intact Fund of Knowledge: fair   Alertness/Orientation: answering questions appropriately    Assets  Assets: Manufacturing systems engineer; Desire for Improvement; Financial Resources/Insurance   Sleep  Fair   Assets  Assets: Manufacturing systems engineer; Desire for Improvement; Financial Resources/Insurance  Physical Exam  Constitutional:      Appearance: the patient is not toxic-appearing. Tattoos visible.  Pulmonary:     Effort: Pulmonary effort is normal.  Neurological:     General: No focal deficit present.     Mental Status: the patient is alert and answering questions appropriately.    Review of Systems  Respiratory:  Negative for shortness of breath.   Cardiovascular:  Negative for chest pain.  Gastrointestinal:  Negative for abdominal pain, constipation, diarrhea, and vomiting. Neurological:  Negative for headaches.   Blood pressure (!) 127/92, pulse 69, temperature 98.4 F (36.9 C), temperature source Oral, resp. rate 18, height 5\' 9"  (1.753 m), weight 165 lb (74.8 kg), SpO2 100 %. Body mass index is 24.37 kg/m.  Demographic Factors:  Male, Adolescent or young adult, and Caucasian  Loss Factors: Decrease in vocational status and Legal issues  Historical Factors: Family history of mental illness or substance abuse  Risk Reduction Factors:   Living with another person, especially a relative and Positive social support  Continued Clinical Symptoms:  Alcohol/Substance Abuse/Dependencies  Cognitive Features That Contribute To Risk:  Thought constriction (tunnel vision)    Suicide Risk:  Minimal: No identifiable suicidal ideation.  Patients presenting with no risk factors but with morbid ruminations; may be classified as minimal risk based on the severity of the depressive symptoms  Plan Of  Care/Follow-up recommendations:  Activity: as tolerated  Diet: heart healthy  Other: -Take your psychiatric medications as prescribed at discharge - instructions are provided to you in the discharge paperwork  -Follow-up with outpatient primary care doctor and other specialists -for management of preventative medicine and chronic medical disease, including: N/A  -Testing: Follow-up with outpatient provider for abnormal lab results: N/A  -Recommend abstinence from alcohol, tobacco, and other illicit drug use at discharge.   -If  your psychiatric symptoms recur, worsen, or if you have side effects to your psychiatric medications, call your outpatient provider, 911, [phone] or go to the nearest emergency department.  -If suicidal thoughts recur, call your outpatient provider, 911, [phone] or go to the nearest emergency department.    Disposition: Home with cousin   Rolanda Lundborg, MD 02/04/2022, 10:57 AM

## 2022-02-04 NOTE — ED Notes (Signed)
Patient resting with no sxs of distress noted - will continue to monitor for safety 

## 2022-02-04 NOTE — Discharge Planning (Signed)
LCSW spoke with patient on this morning to discuss disposition plans. Patient reports he has been doing better and reports his plan is to return home with his father and grandfather once stable for discharge. Patient reports no interest in outpatient substance abuse treatment. Patient reports he is familiar with [facility] in [place], and reports being agreeable to follow up for medication management with that agency. Patient aware that information will be updated in his AVS for review. No other needs were reported at this time.   LCSW will continue to follow and provide support to patient while in [facility].   Fernande Boyden, LCSW Clinical Social Worker [place] [facility] Ph: [phone]

## 2022-02-04 NOTE — ED Provider Notes (Signed)
Behavioral Health Progress Note  Date and Time: 02/04/2022 8:35 AM Name: Lance Marquez MRN:  [id number]  Reason for admission: Evon Lopezperez is a 26 y.o. male with depressed mood, likely substance-induced and requesting detox from fentanyl, ice, and coke.   Subjective:  Patient seen lying in bed. Patient is day 4 of detox from fentanyl, ice, coke. Continues on clonidine taper. Reports poor sleep overnight, both falling and staying asleep. Reports decreased appetite. Overall feels a little bit better in terms of withdrawal symptoms. Reports mood is fine. Denies SI/HI/AVH. Reports he is trying to take it a day at a time and is goal is to get clean. Asked about his thoughts on substance use and he reports "I don't know." Discussed paradox between wanting to be clean and not thinking about his substance use. He reports he will continue thinking about it today. Talked with family yesterday, reports it "went good." Denies any side effects from the zoloft. He denies SI/HI/AVH.   Past 24 hours:  CIWA: 0, 0 COWS: 0, 0  Diagnosis:  Final diagnoses:  Substance induced mood disorder (HCC)  Uncomplicated opioid dependence (HCC)  Substance abuse (HCC)   Total Time spent with patient: 30 minutes  Past Psychiatric History: History of suicidal behavior, drug abuse and dependency, opioid withdrawal and substance-induced mood disorder. A reported history of PTSD, MDD and bipolar.    Past Medical History:  Past Medical History:  Diagnosis Date   Asthma    C. difficile diarrhea    Diarrhea    Drug abuse and dependence (HCC)    Parasites in stool    Suicidal behavior     Past Surgical History:  Procedure Laterality Date   KNEE ARTHROSCOPY Right 03/22/2021   Procedure: ARTHROSCOPY KNEE;  Surgeon: Signa Kell, MD;  Location: [facility] ORS;  Service: Orthopedics;  Laterality: Right;   Family History: No family history on file. Family Psychiatric  History: Unknown Social History:  Social  History   Substance and Sexual Activity  Alcohol Use Yes   Comment: occ     Social History   Substance and Sexual Activity  Drug Use Yes   Types: IV, Methamphetamines   Comment: heroin, meth    Social History   Socioeconomic History   Marital status: Single    Spouse name: Not on file   Number of children: Not on file   Years of education: Not on file   Highest education level: Not on file  Occupational History   Not on file  Tobacco Use   Smoking status: Every Day    Types: Cigarettes   Smokeless tobacco: Never  Substance and Sexual Activity   Alcohol use: Yes    Comment: occ   Drug use: Yes    Types: IV, Methamphetamines    Comment: heroin, meth   Sexual activity: Never  Other Topics Concern   Not on file  Social History Narrative   Not on file   Social Determinants of Health   Financial Resource Strain: Not on file  Food Insecurity: Not on file  Transportation Needs: Not on file  Physical Activity: Not on file  Stress: Not on file  Social Connections: Not on file   SDOH:  SDOH Screenings   Depression (PHQ2-9): High Risk (02/03/2022)  Tobacco Use: High Risk (12/07/2021)   Additional Social History:    Pain Medications: See MAR Prescriptions: See MAR Over the Counter: See MAR History of alcohol / drug use?: Yes Negative Consequences of Use: Legal, Work /  School Withdrawal Symptoms: None Name of Substance 1: Fent 1 - Age of First Use: 19 1 - Amount (size/oz): 3-4 SHOTS 1 - Frequency: daily 1 - Duration: ongoing 1 - Last Use / Amount: 4-5 hours ago 1 - Method of Aquiring: unknown 1- Route of Use: IV Name of Substance 2: Meth 2 - Age of First Use: 23 2 - Amount (size/oz): 3-4 hots 2 - Frequency: couple times a month 2 - Duration: ongoing 2 - Last Use / Amount: yesterday 2 - Method of Aquiring: unknown 2 - Route of Substance Use: IV Name of Substance 3: Cocaine 3 - Age of First Use: 21 3 - Amount (size/oz): unknown 3 - Frequency: couple  times a month 3 - Duration: ongoing 3 - Last Use / Amount: yesterday 3 - Method of Aquiring: unknown 3 - Route of Substance Use: IV             Sleep: Fair  Appetite:  Fair  Current Medications:  Current Facility-Administered Medications  Medication Dose Route Frequency Provider Last Rate Last Admin   acetaminophen (TYLENOL) tablet 650 mg  650 mg Oral Q6H PRN White, Patrice L, NP       alum & mag hydroxide-simeth (MAALOX/MYLANTA) 200-200-20 MG/5ML suspension 30 mL  30 mL Oral Q4H PRN White, Patrice L, NP       cloNIDine (CATAPRES) tablet 0.1 mg  0.1 mg Oral BH-qamhs White, Patrice L, NP   0.1 mg at 02/03/22 2137   Followed by   Melene Muller ON 02/05/2022] cloNIDine (CATAPRES) tablet 0.1 mg  0.1 mg Oral QAC breakfast White, Patrice L, NP       dicyclomine (BENTYL) tablet 20 mg  20 mg Oral Q6H PRN White, Patrice L, NP       hydrOXYzine (ATARAX) tablet 25 mg  25 mg Oral TID PRN White, Patrice L, NP   25 mg at 02/03/22 2139   loperamide (IMODIUM) capsule 2-4 mg  2-4 mg Oral PRN White, Patrice L, NP       magnesium hydroxide (MILK OF MAGNESIA) suspension 30 mL  30 mL Oral Daily PRN White, Patrice L, NP       methocarbamol (ROBAXIN) tablet 500 mg  500 mg Oral Q8H PRN White, Patrice L, NP   500 mg at 02/03/22 1446   naproxen (NAPROSYN) tablet 500 mg  500 mg Oral BID PRN White, Patrice L, NP   500 mg at 02/03/22 1446   ondansetron (ZOFRAN-ODT) disintegrating tablet 4 mg  4 mg Oral Q6H PRN White, Patrice L, NP       sertraline (ZOLOFT) tablet 50 mg  50 mg Oral Daily Karie Fetch, MD   50 mg at 02/03/22 0921   traZODone (DESYREL) tablet 50 mg  50 mg Oral QHS PRN White, Patrice L, NP   50 mg at 02/03/22 2139   No current outpatient medications on file.    Labs  Lab Results:  Admission on 01/31/2022  Component Date Value Ref Range Status   SARS Coronavirus 2 by RT PCR 01/31/2022 NEGATIVE  NEGATIVE Final   Comment: (NOTE) SARS-CoV-2 target nucleic acids are NOT DETECTED.  The SARS-CoV-2  RNA is generally detectable in upper respiratory specimens during the acute phase of infection. The lowest concentration of SARS-CoV-2 viral copies this assay can detect is 138 copies/mL. A negative result does not preclude SARS-Cov-2 infection and should not be used as the sole basis for treatment or other patient management decisions. A negative result may occur with  improper  specimen collection/handling, submission of specimen other than nasopharyngeal swab, presence of viral mutation(s) within the areas targeted by this assay, and inadequate number of viral copies(<138 copies/mL). A negative result must be combined with clinical observations, patient history, and epidemiological information. The expected result is Negative.  Fact Sheet for Patients:  BloggerCourse.comhttps://www.fda.gov/media/152166/download  Fact Sheet for Healthcare Providers:  SeriousBroker.ithttps://www.fda.gov/media/152162/download  This test is no                          t yet approved or cleared by the [place] FDA and  has been authorized for detection and/or diagnosis of SARS-CoV-2 by FDA under an Emergency Use Authorization (EUA). This EUA will remain  in effect (meaning this test can be used) for the duration of the COVID-19 declaration under Section 564(b)(1) of the Act, 21 U.S.C.section 360bbb-3(b)(1), unless the authorization is terminated  or revoked sooner.       Influenza A by PCR 01/31/2022 NEGATIVE  NEGATIVE Final   Influenza B by PCR 01/31/2022 NEGATIVE  NEGATIVE Final   Comment: (NOTE) The Xpert Xpress SARS-CoV-2/FLU/RSV plus assay is intended as an aid in the diagnosis of influenza from Nasopharyngeal swab specimens and should not be used as a sole basis for treatment. Nasal washings and aspirates are unacceptable for Xpert Xpress SARS-CoV-2/FLU/RSV testing.  Fact Sheet for Patients: BloggerCourse.comhttps://www.fda.gov/media/152166/download  Fact Sheet for Healthcare  Providers: SeriousBroker.ithttps://www.fda.gov/media/152162/download  This test is not yet approved or cleared by the [place] FDA and has been authorized for detection and/or diagnosis of SARS-CoV-2 by FDA under an Emergency Use Authorization (EUA). This EUA will remain in effect (meaning this test can be used) for the duration of the COVID-19 declaration under Section 564(b)(1) of the Act, 21 U.S.C. section 360bbb-3(b)(1), unless the authorization is terminated or revoked.     Resp Syncytial Virus by PCR 01/31/2022 NEGATIVE  NEGATIVE Final   Comment: (NOTE) Fact Sheet for Patients: BloggerCourse.comhttps://www.fda.gov/media/152166/download  Fact Sheet for Healthcare Providers: SeriousBroker.ithttps://www.fda.gov/media/152162/download  This test is not yet approved or cleared by the [place] FDA and has been authorized for detection and/or diagnosis of SARS-CoV-2 by FDA under an Emergency Use Authorization (EUA). This EUA will remain in effect (meaning this test can be used) for the duration of the COVID-19 declaration under Section 564(b)(1) of the Act, 21 U.S.C. section 360bbb-3(b)(1), unless the authorization is terminated or revoked.  Performed at [facility], [address] [place], [place] [id number]    Sodium 01/31/2022 136  135 - 145 mmol/L Final   Potassium 01/31/2022 4.2  3.5 - 5.1 mmol/L Final   Chloride 01/31/2022 104  98 - 111 mmol/L Final   CO2 01/31/2022 22  22 - 32 mmol/L Final   Glucose, Bld 01/31/2022 95  70 - 99 mg/dL Final   Glucose reference range applies only to samples taken after fasting for at least 8 hours.   BUN 01/31/2022 15  6 - 20 mg/dL Final   Creatinine, Ser 01/31/2022 0.90  0.61 - 1.24 mg/dL Final   Calcium 19/14/782912/21/2023 9.2  8.9 - 10.3 mg/dL Final   Total Protein 56/21/308612/21/2023 8.0  6.5 - 8.1 g/dL Final   Albumin 57/84/696212/21/2023 3.7  3.5 - 5.0 g/dL Final   AST 95/28/413212/21/2023 20  15 - 41 U/L Final   ALT 01/31/2022 13  0 - 44 U/L Final   Alkaline Phosphatase 01/31/2022 74  38 - 126 U/L  Final   Total Bilirubin 01/31/2022 0.6  0.3 - 1.2  mg/dL Final   GFR, Estimated 01/31/2022 >60  >60 mL/min Final   Comment: (NOTE) Calculated using the CKD-EPI Creatinine Equation (2021)    Anion gap 01/31/2022 10  5 - 15 Final   Performed at [facility], [address], [place], [place] [address]   Alcohol, Ethyl (B) 01/31/2022 <10  <10 mg/dL Final   Comment: (NOTE) Lowest detectable limit for serum alcohol is 10 mg/dL.  For medical purposes only. Performed at [facility], [address] [place], [place] [address]    POC Amphetamine UR 01/31/2022 Positive (A)  NONE DETECTED (Cut Off Level 1000 ng/mL) Final   POC Secobarbital (BAR) 01/31/2022 None Detected  NONE DETECTED (Cut Off Level 300 ng/mL) Final   POC Buprenorphine (BUP) 01/31/2022 None Detected  NONE DETECTED (Cut Off Level 10 ng/mL) Final   POC Oxazepam (BZO) 01/31/2022 None Detected  NONE DETECTED (Cut Off Level 300 ng/mL) Final   POC Cocaine UR 01/31/2022 Positive (A)  NONE DETECTED (Cut Off Level 300 ng/mL) Final   POC Methamphetamine UR 01/31/2022 Positive (A)  NONE DETECTED (Cut Off Level 1000 ng/mL) Final   POC Morphine 01/31/2022 Positive (A)  NONE DETECTED (Cut Off Level 300 ng/mL) Final   POC Methadone UR 01/31/2022 None Detected  NONE DETECTED (Cut Off Level 300 ng/mL) Final   POC Oxycodone UR 01/31/2022 None Detected  NONE DETECTED (Cut Off Level 100 ng/mL) Final   POC Marijuana UR 01/31/2022 Positive (A)  NONE DETECTED (Cut Off Level 50 ng/mL) Final   HIV Screen 4th Generation wRfx 01/31/2022 Non Reactive  Non Reactive Final   Performed at [facility], [address] [place], [place] [address]   SARSCOV2ONAVIRUS 2 AG 01/31/2022 NEGATIVE  NEGATIVE Final   Comment: (NOTE) SARS-CoV-2 antigen NOT DETECTED.   Negative results are presumptive.  Negative results do not preclude SARS-CoV-2 infection and should not be used as the sole basis for treatment or other patient management decisions,  including infection  control decisions, particularly in the presence of clinical signs and  symptoms consistent with COVID-19, or in those who have been in contact with the virus.  Negative results must be combined with clinical observations, patient history, and epidemiological information. The expected result is Negative.  Fact Sheet for Patients: https://www.jennings-kim.com/  Fact Sheet for Healthcare Providers: https://alexander-rogers.biz/  This test is not yet approved or cleared by the [place] FDA and  has been authorized for detection and/or diagnosis of SARS-CoV-2 by FDA under an Emergency Use Authorization (EUA).  This EUA will remain in effect (meaning this test can be used) for the duration of  the COV                          ID-19 declaration under Section 564(b)(1) of the Act, 21 U.S.C. section 360bbb-3(b)(1), unless the authorization is terminated or revoked sooner.     Cholesterol 01/31/2022 113  0 - 200 mg/dL Final   Triglycerides 16/08/3708 109  <150 mg/dL Final   HDL 62/69/4854 21 (L)  >40 mg/dL Final   Total CHOL/HDL Ratio 01/31/2022 5.4  RATIO Final   VLDL 01/31/2022 22  0 - 40 mg/dL Final   LDL Cholesterol 01/31/2022 70  0 - 99 mg/dL Final   Comment:        Total Cholesterol/HDL:CHD Risk Coronary Heart Disease Risk Table  Men   Women  1/2 Average Risk   3.4   3.3  Average Risk       5.0   4.4  2 X Average Risk   9.6   7.1  3 X Average Risk  23.4   11.0        Use the calculated Patient Ratio above and the CHD Risk Table to determine the patient's CHD Risk.        ATP III CLASSIFICATION (LDL):  <100     mg/dL   Optimal  161-096  mg/dL   Near or Above                    Optimal  130-159  mg/dL   Borderline  045-409  mg/dL   High  >811     mg/dL   Very High Performed at [facility], [address] [place], [place] [address]    TSH 01/31/2022 2.984  0.350 - 4.500 uIU/mL Final   Comment:  Performed by a 3rd Generation assay with a functional sensitivity of <=0.01 uIU/mL. Performed at [facility], [address], [place], [place] [address]     Blood Alcohol level:  Lab Results  Component Value Date   ETH <10 01/31/2022   ETH <10 02/28/2019    Metabolic Disorder Labs: No results found for: "HGBA1C", "MPG" No results found for: "PROLACTIN" Lab Results  Component Value Date   CHOL 113 01/31/2022   TRIG 109 01/31/2022   HDL 21 (L) 01/31/2022   CHOLHDL 5.4 01/31/2022   VLDL 22 01/31/2022   LDLCALC 70 01/31/2022    Therapeutic Lab Levels: No results found for: "LITHIUM" No results found for: "VALPROATE" No results found for: "CBMZ"  Physical Findings   PHQ2-9    Flowsheet Row ED from 01/31/2022 in [facility]  PHQ-2 Total Score 2  PHQ-9 Total Score 11      Flowsheet Row ED from 01/31/2022 in [facility] ED from 12/07/2021 in [facility] Admission (Discharged) from 04/18/2021 in [facility]  C-SSRS RISK CATEGORY No Risk No Risk No Risk        Musculoskeletal  Strength & Muscle Tone: within normal limits Gait & Station:  did not assess Patient leans: N/A  Mental Status Exam   Appearance and Grooming: Patient is casually dressed and appears disheveled. The patient has no noticeable scent or odor. Motor activity: The patient's movement and gait were not observed. There was no notable abnormal facial movements and no notable abnormal extremity movements. Behavior: The patient appears in no acute distress, and during the interview, was focused, required minimal redirection, and behaving appropriately to scenario; he was able to follow commands and compliant to requests and made minimal eye contact. The patient did not appear internally or externally preoccupied. Attitude: Patient's attitude towards the interviewer was cooperative  and open. Speech: The patient's speech was clear, fluent, with good articulation, and with appropriately placed inflections. The volume of his speech was normal and normal in quantity. The rate was normal with a normal rhythm. Responses were normal in latency. There were no abnormal patterns in speech. Mood: "Little bit better" Affect: Patient's affect is flat with broad range and even fluctuations; his affect is congruent with his stated mood. ------------------------------------------------------------------------------------------------------------------------- Thought Content The patient experiences no hallucinations. The patient describes no delusional thoughts;   Patient at the time of interview denies active  suicidal intent and denies passive suicidal ideation; he denies homicidal intent. Thought Process The patient's thought process is linear and is goal-directed. Insight The patient at the time of interview demonstrates fair insight Judgement The patient over the past 24 hours demonstrates fair judgement  Memory: limited  Executive Functions  Concentration: intact Attention Span: fair Recall: intact Fund of Knowledge: fair  Alertness/Orientation: answering questions appropriately   Assets  Assets: Manufacturing systems engineer; Desire for Improvement; Financial Resources/Insurance  Sleep  Fair   Physical Exam  Constitutional:      Appearance: the patient is not toxic-appearing. Tattoos visible.  Pulmonary:     Effort: Pulmonary effort is normal.  Neurological:     General: No focal deficit present.     Mental Status: the patient is alert and answering questions appropriately.   Review of Systems  Respiratory:  Negative for shortness of breath.   Cardiovascular:  Negative for chest pain.  Gastrointestinal:  Negative for abdominal pain, constipation, diarrhea, and vomiting. Neurological:  Negative for headaches.   Blood pressure (!) 127/92, pulse 69, temperature 98.4  F (36.9 C), temperature source Oral, resp. rate 18, height 5\' 9"  (1.753 m), weight 165 lb (74.8 kg), SpO2 100 %. Body mass index is 24.37 kg/m.  Treatment Plan Summary: Daily contact with patient to assess and evaluate symptoms and progress in treatment  Patient undergoing detox from fentanyl, ice, and coke with prior rehab 7 months ago in [place]. Fentanyl use is most regular and recent, reports daily use. UDS+amphetamine, cocaine, meth, morphine, MJ. Reported psychiatric history of depression, PTSD, and bipolar. Prior medication trial includes wellbutrin when he was 26 years old. Reports symptoms of depression, was amenable to starting on zoloft. Will continue zoloft to 50mg  to address his depression. He is interested in outpatient therapy and medication management, we will work on setting up for him. Recently was in [facility] IOP but states transportation issues made that difficult. Eventually his goal is to go to [facility] in [place].   #Fentanyl use disorder #Methamphetamine use disorder #Cocaine use disorder -Continue clonidine taper (12/21-12/27) -Continue PRNs -Tylenol 650 mg q6hr PRN for mild pain -Mylanta 30 ml suspension for indigestion -Milk of Magnesia 30 ml for constipation -Trazodone 50 mg for insomnia -Hydroxyzine 25 mg tid PRN for anxiety   #Depression -Continue zoloft 50mg   [place], MD, PGY-1 02/04/2022 8:35 AM

## 2022-02-04 NOTE — ED Notes (Signed)
Pt was given AVS along with follow up instructions.  Pt was walked to locker and got his belongings were returned to him.   Pt was escorted to lobby where he left with his cousin Clifton Custard.   Pt denies SI, HI or AVH at time of discharged.  Was made aware of community resources and follow up options.

## 2022-02-04 NOTE — ED Notes (Signed)
Pt resting at this time. Staff will cont to monitor.  

## 2022-02-05 LAB — GC/CHLAMYDIA PROBE AMP (~~LOC~~) NOT AT ARMC
Chlamydia: NEGATIVE
Comment: NEGATIVE
Comment: NORMAL
Neisseria Gonorrhea: NEGATIVE

## 2023-10-26 IMAGING — DX DG CHEST 1V PORT
1 series · 1 of 1 positions shown · non-contrast
Comparison: Radiograph 04/16/2021, CT 04/16/2021

CLINICAL DATA: Left thoracentesis

EXAM:
PORTABLE CHEST 1 VIEW

[chest ap]
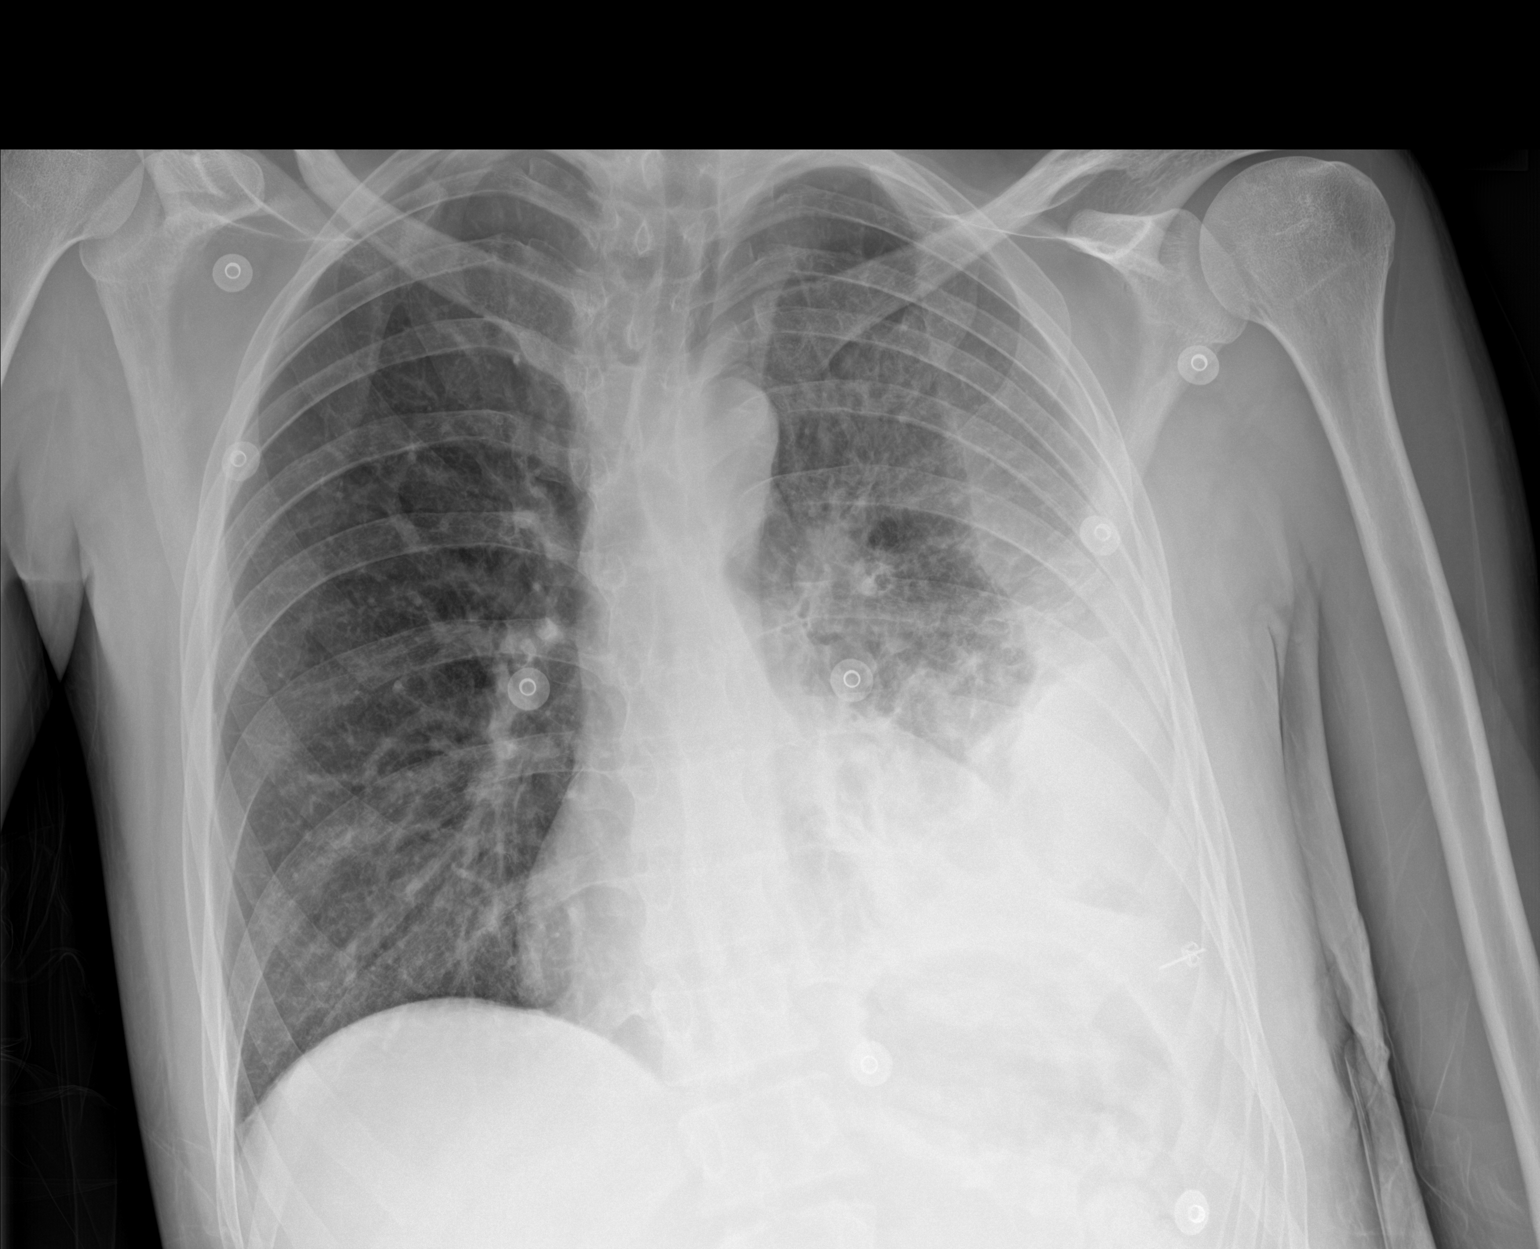

[1 of 1 positions shown; findings below may reference images not displayed]

FINDINGS: Decreased left pleural effusion, now small-moderate size, and
adjacent atelectasis after thoracentesis. No visible pneumothorax.
Right lung is clear. Unchanged cardiomediastinal silhouette. No
acute osseous abnormality.
IMPRESSION: Decreased left pleural effusion, now small-moderate size, and
adjacent opacities after thoracentesis. No visible pneumothorax.

## 2023-10-26 IMAGING — US US THORACENTESIS ASP PLEURAL SPACE W/IMG GUIDE
1 series · 7 of 7 positions shown · non-contrast
Comparison: none

INDICATION: Shortness of breath with chest pain and left-sided pleural effusion
request received for diagnostic and therapeutic thoracentesis.

[Series 1: us thoracentesis asp pleural space w/img guide · 0.22mm/px · 7 of 7 slices shown]
[im 1/7]
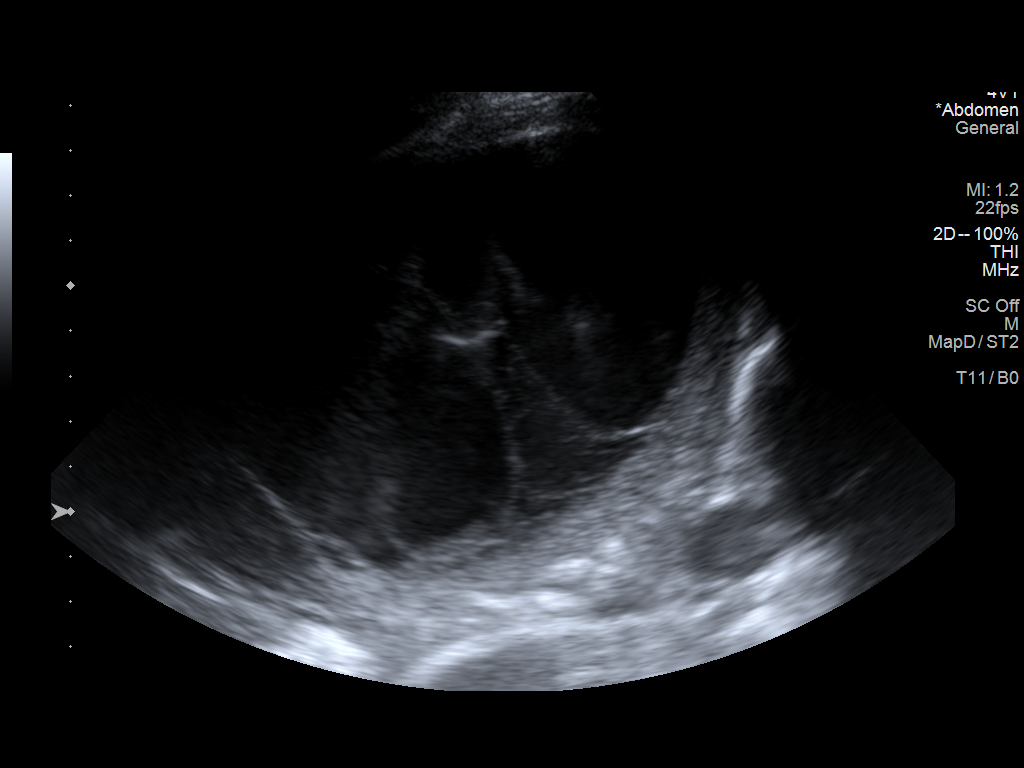
[im 2/7]
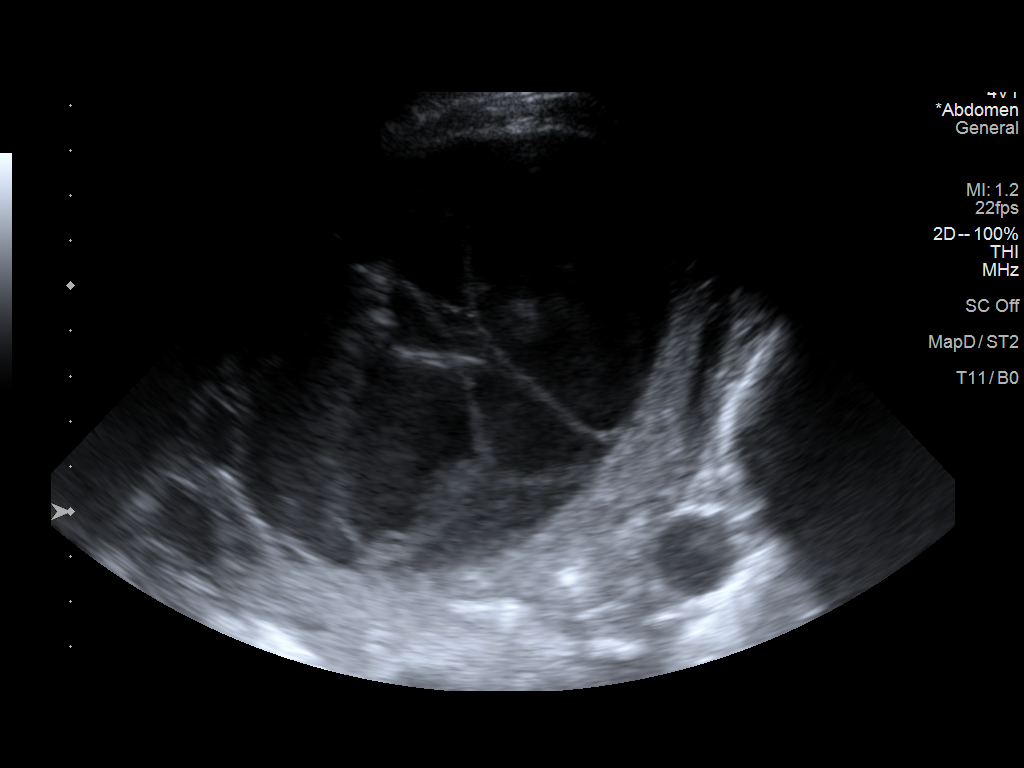
[im 3/7]
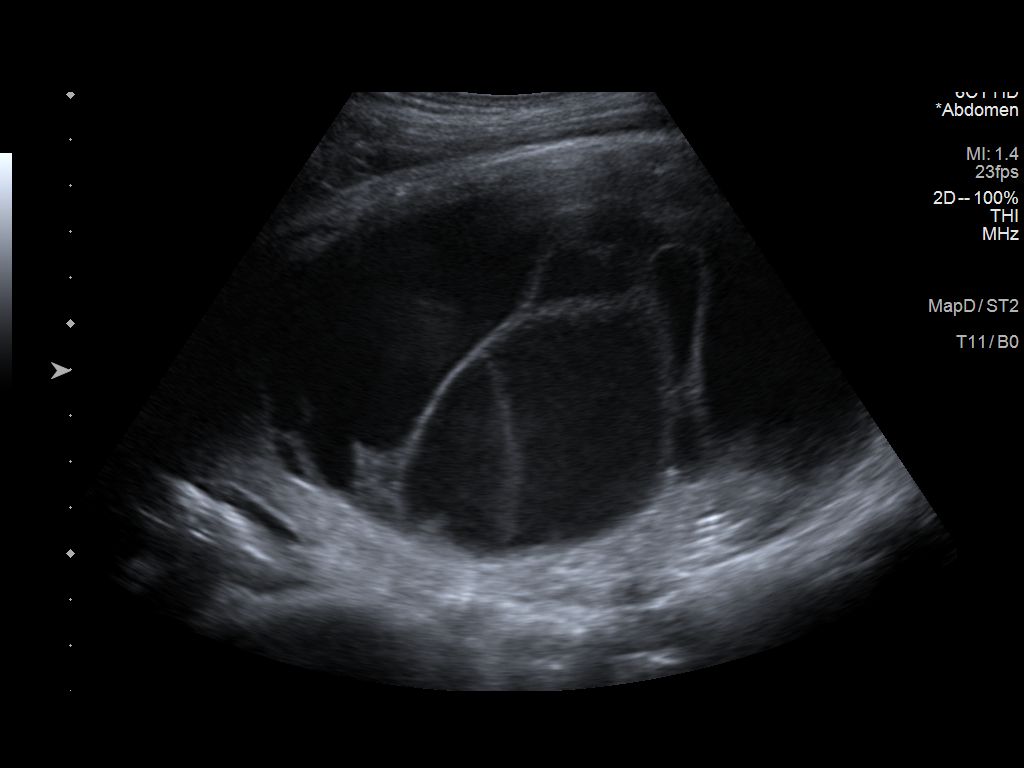
[im 4/7]
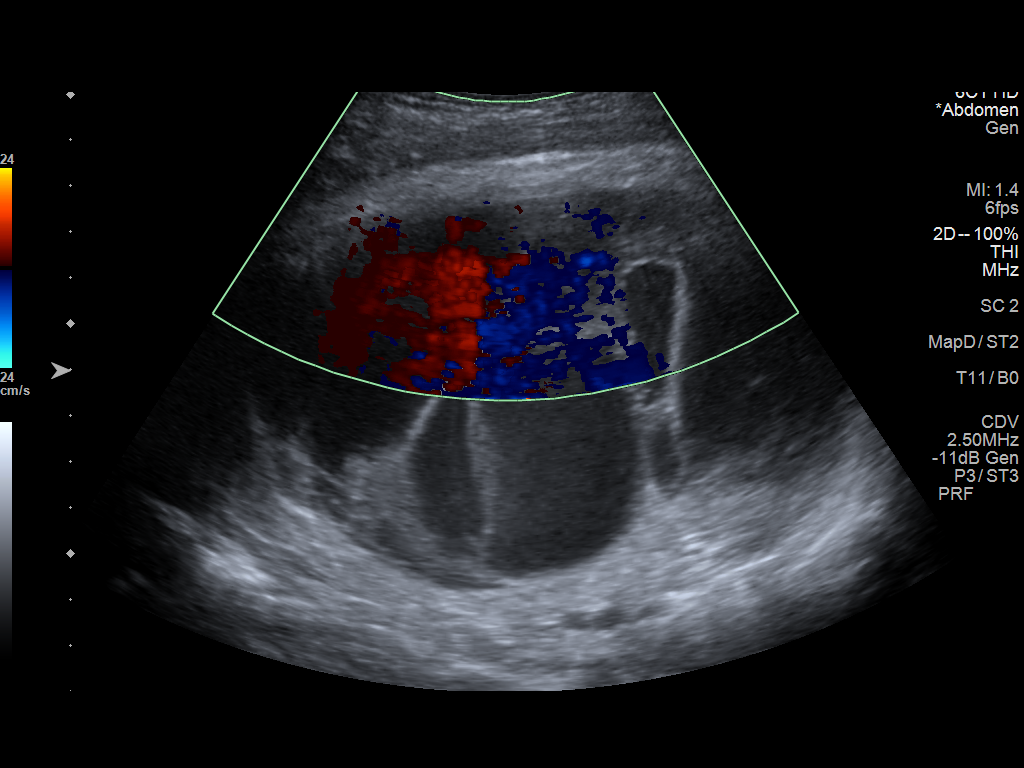
[im 5/7]
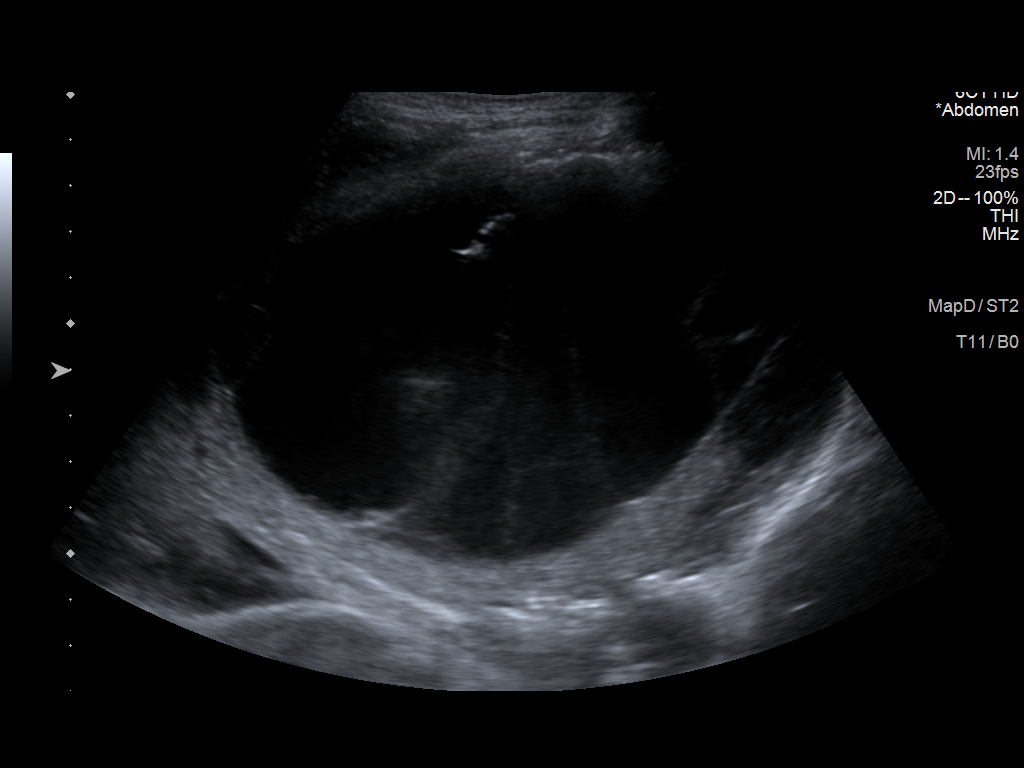
[im 6/7]
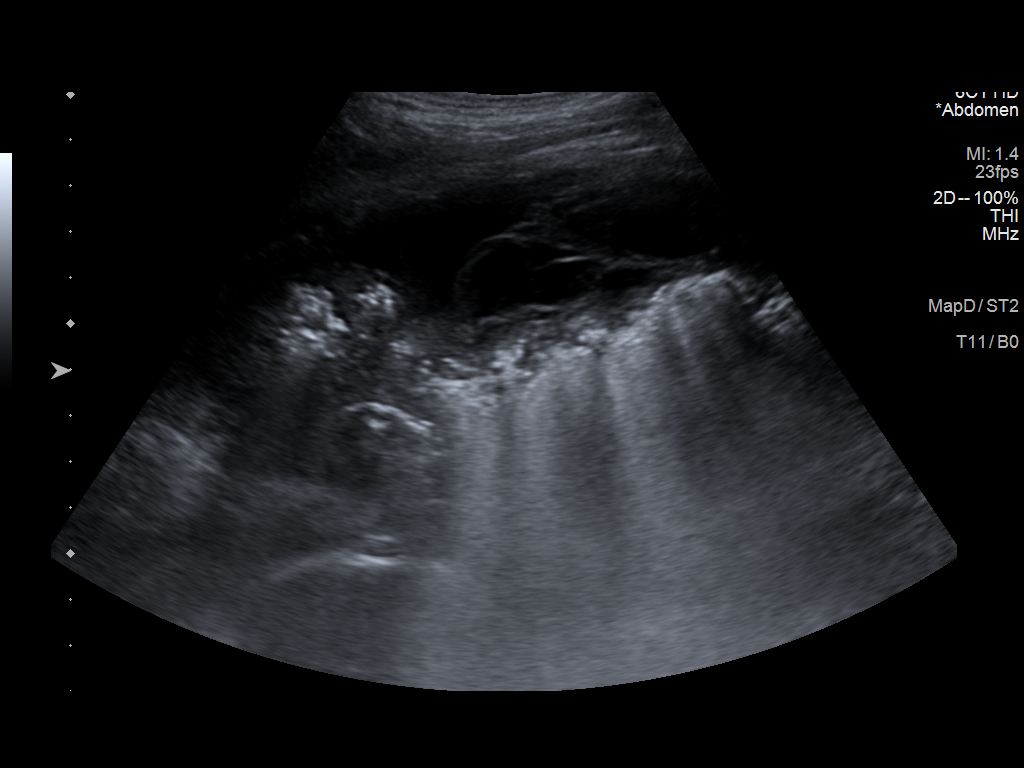
[im 7/7]
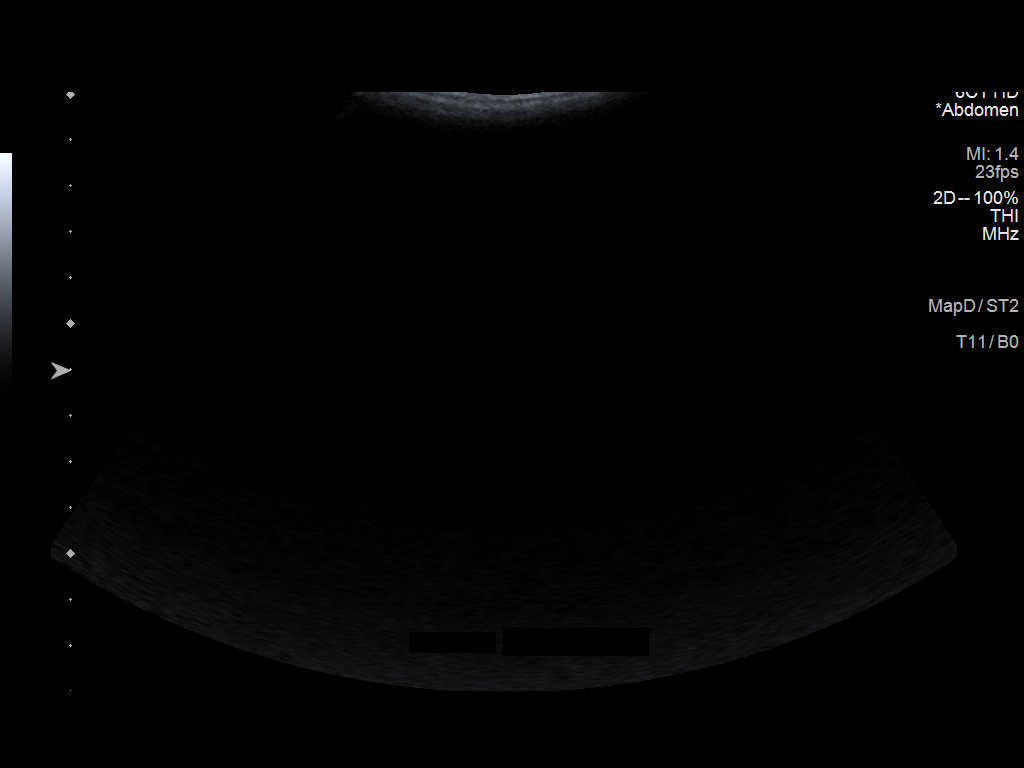

[7 of 7 positions shown; findings below may reference images not displayed]

EXAM:
ULTRASOUND GUIDED LEFT THORACENTESIS

MEDICATIONS:
Local 1% lidocaine only.

COMPLICATIONS:
None immediate.

PROCEDURE:
An ultrasound guided thoracentesis was thoroughly discussed with the
patient and questions answered. The benefits, risks, alternatives
and complications were also discussed. The patient understands and
wishes to proceed with the procedure. Written consent was obtained.

Ultrasound was performed to localize and mark an adequate pocket of
fluid in the left chest, effusion was severely loculated. The area
was then prepped and draped in the normal sterile fashion. 1%
Lidocaine was used for local anesthesia. Under ultrasound guidance a
19 gauge, 7-cm, Yueh catheter was introduced. Thoracentesis was
performed. Procedure was stopped early secondary to patient's
complaints of pain with small remaining pleural effusion seen post
procedure. The catheter was removed and a dressing applied.
FINDINGS: A total of approximately 670 mL of clear yellow fluid was removed.
Samples were sent to the laboratory as requested by the clinical
team.
IMPRESSION: Successful ultrasound guided left thoracentesis yielding 670 mL of
pleural fluid.

This exam was performed by Farus Monzalve, and was supervised
and interpreted by Dr. Pashayev.

## 2023-10-26 IMAGING — CT CT ANGIO CHEST
2 of 7 series · 18 of 46 positions shown · IV contrast (APPLIED)
Comparison: None.

CLINICAL DATA: Pulmonary embolism (PE) suspected, high prob

EXAM:
CT ANGIOGRAPHY CHEST WITH CONTRAST
TECHNIQUE: Multidetector CT imaging of the chest was performed using the
standard protocol during bolus administration of intravenous
contrast. Multiplanar CT image reconstructions and MIPs were
obtained to evaluate the vascular anatomy.

[Series 5: thins · axial · 0.71mm/px · z∈[-357,-89]mm · 15 of 373 slices shown]
[im 19/373  lung]
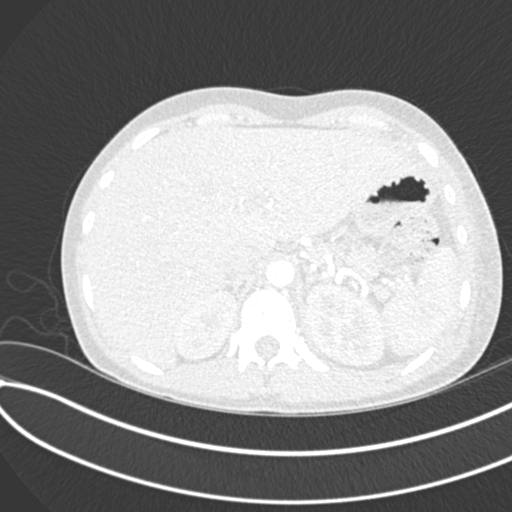
[im 38/373  soft-tissue]
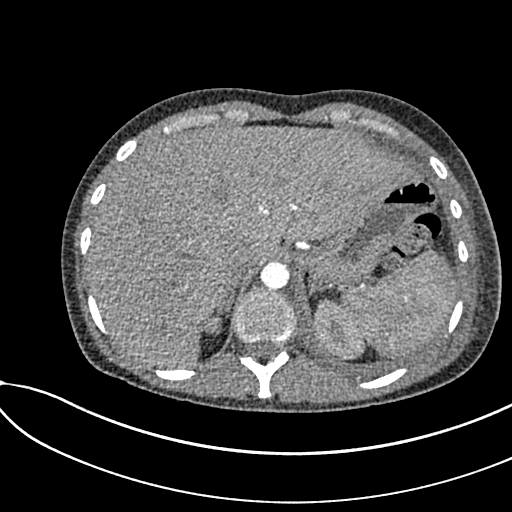
[im 75/373  lung]
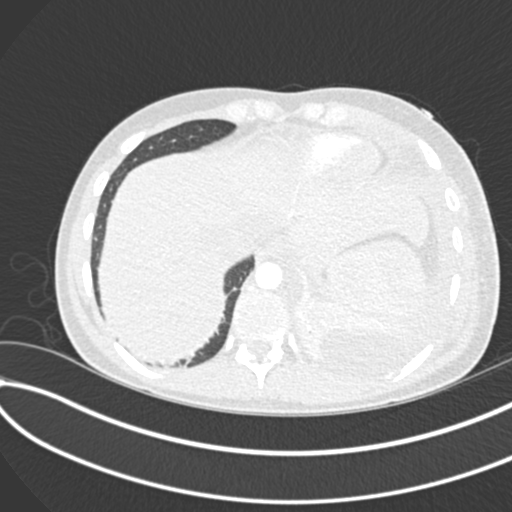
[im 94/373  soft-tissue]
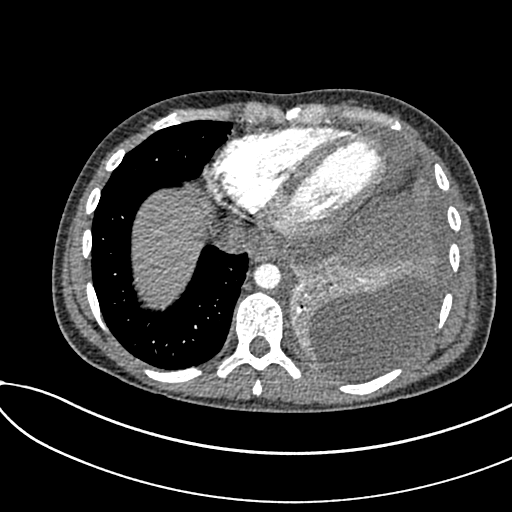
[im 112/373  lung]
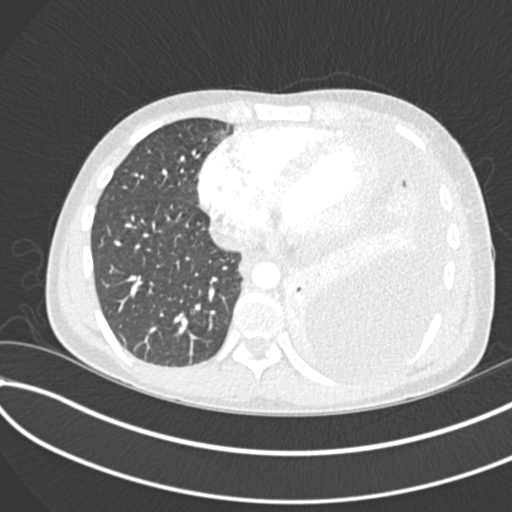
[im 131/373  soft-tissue]
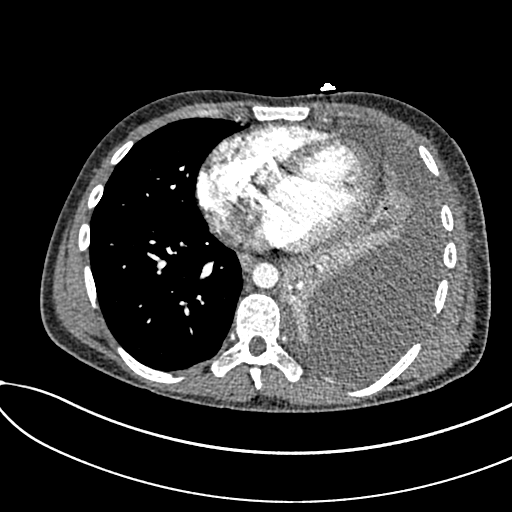
[im 168/373  lung]
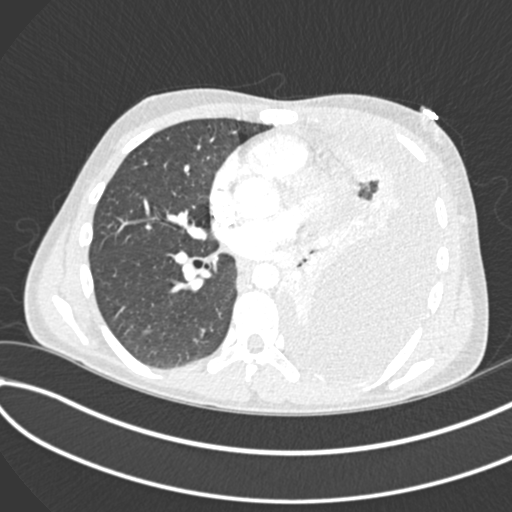
[im 187/373  soft-tissue]
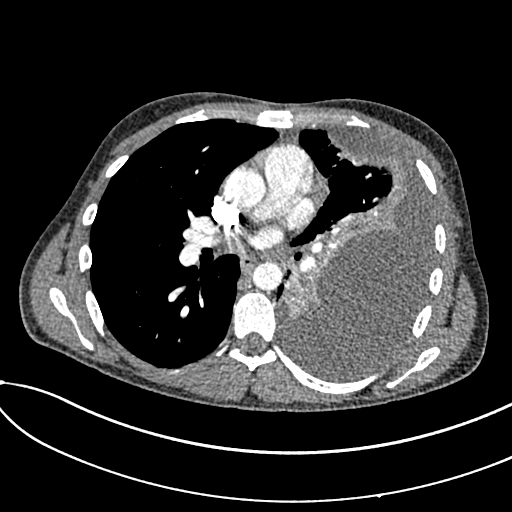
[im 205/373  lung]
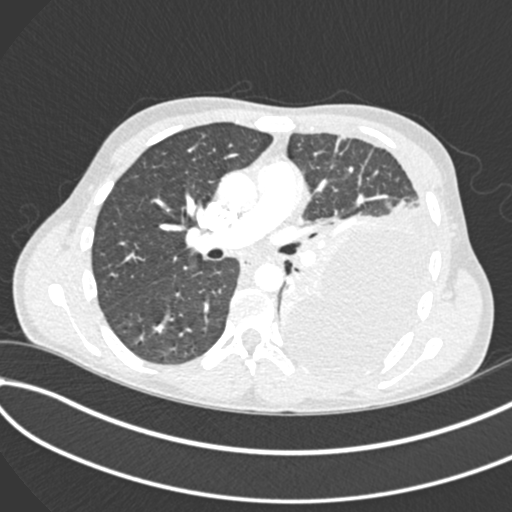
[im 242/373  soft-tissue]
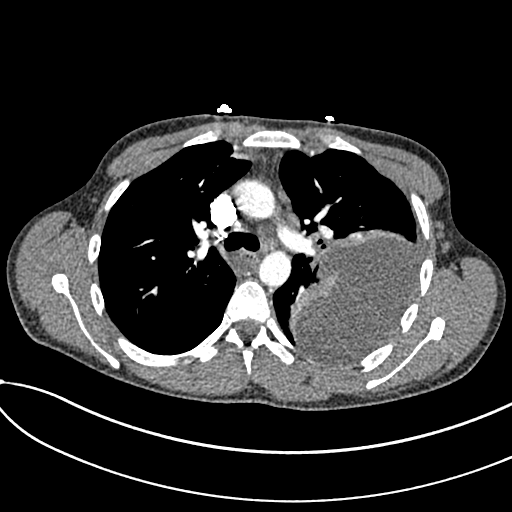
[im 261/373  lung]
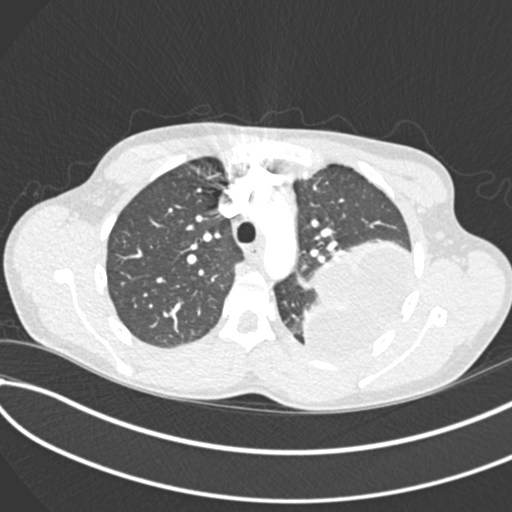
[im 280/373  soft-tissue]
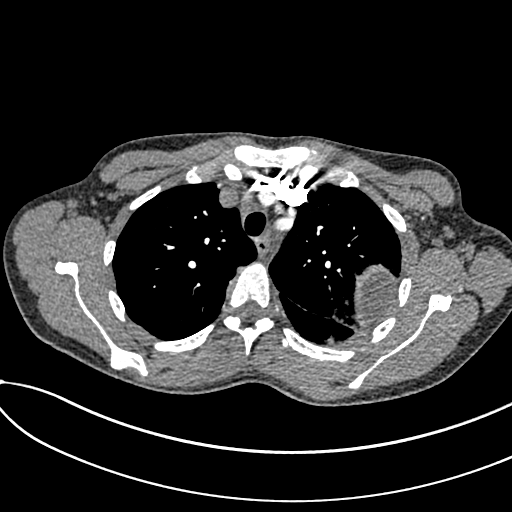
[im 298/373  lung]
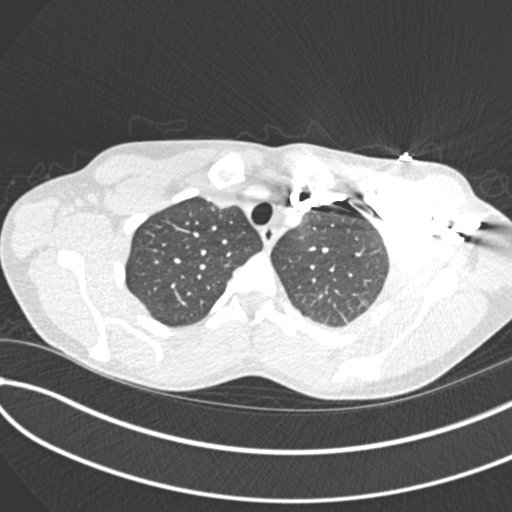
[im 335/373  soft-tissue]
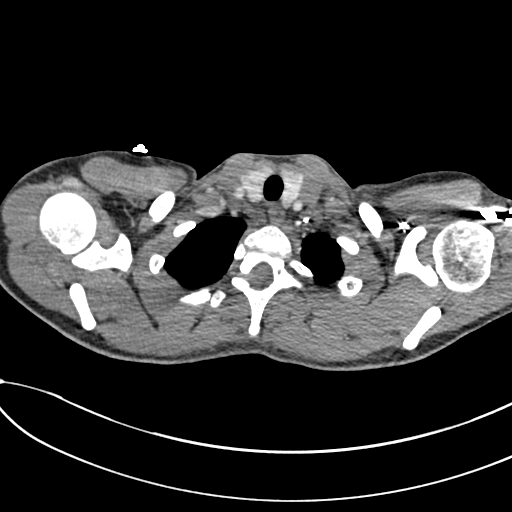
[im 354/373  lung]
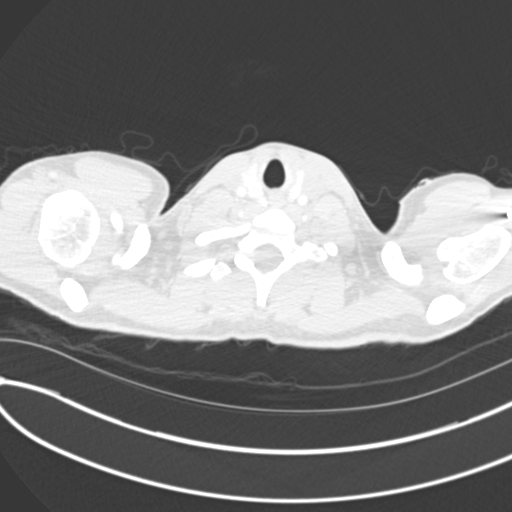

[Series 7: coronal mpr · coronal · 0.58mm/px · 3 of 80 slices shown]
[im 20/80  soft-tissue]
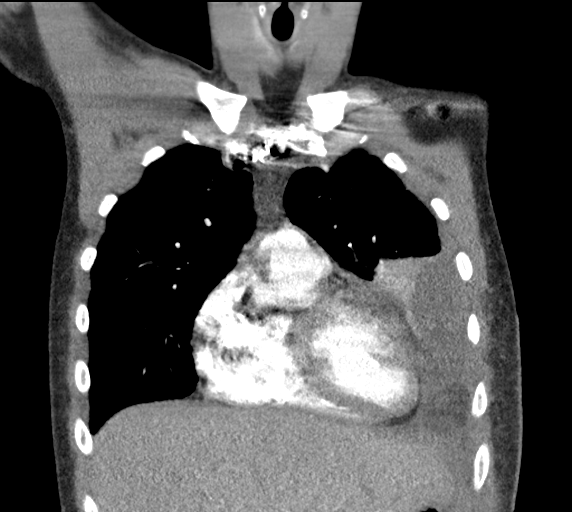
[im 40/80  soft-tissue]
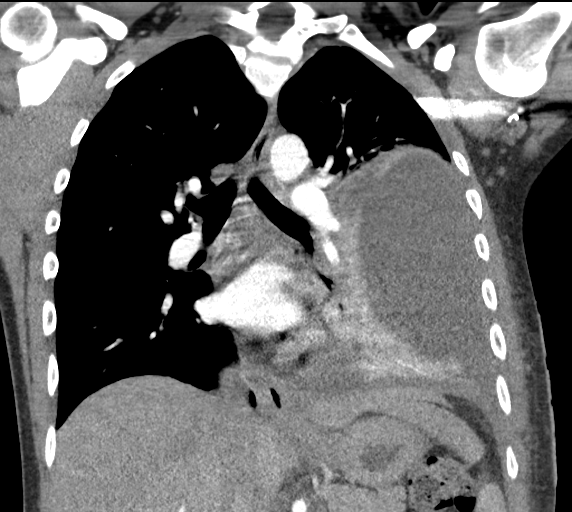
[im 60/80  soft-tissue]
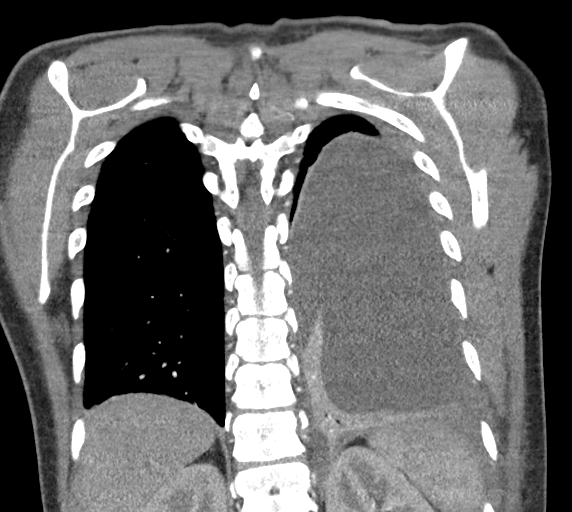

[18 of 46 positions shown; findings below may reference images not displayed]

RADIATION DOSE REDUCTION: This exam was performed according to the
departmental dose-optimization program which includes automated
exposure control, adjustment of the mA and/or kV according to
patient size and/or use of iterative reconstruction technique.

CONTRAST:  75mL OMNIPAQUE IOHEXOL 350 MG/ML SOLN
FINDINGS: Cardiovascular: Satisfactory opacification of the pulmonary arteries
to the segmental level. No evidence of pulmonary embolism. Normal
heart size. No pericardial effusion.

Mediastinum/Nodes: No enlarged nodes. Thyroid and esophagus are
unremarkable.

Lungs/Pleura: Large left pleural effusion. Collapse of the most of
the left lower lobe and lingula. Right lung is clear. No
pneumothorax.

Upper Abdomen: No acute abnormality.

Musculoskeletal: Age-indeterminate mild loss of height at the
superior endplates of T2 and T3.

Review of the MIP images confirms the above findings.
IMPRESSION: Large left pleural effusion with significant lower lobe and lingular
collapse. Effusion may be partially loculated. Underlying pneumonia
is not excluded.

No evidence of pulmonary embolism.

Age-indeterminate mild T2 and T3 compression fractures are probably
chronic.
